# Patient Record
Sex: Female | Born: 1985 | Hispanic: Yes | Marital: Married | Smoking: Current every day smoker
Health system: Southern US, Community
[De-identification: ages and names within clinical notes are randomized; demographics above are authoritative.]

## PROBLEM LIST (undated history)

## (undated) ENCOUNTER — Inpatient Hospital Stay (HOSPITAL_COMMUNITY): Payer: Self-pay

## (undated) DIAGNOSIS — N939 Abnormal uterine and vaginal bleeding, unspecified: Secondary | ICD-10-CM

## (undated) DIAGNOSIS — B1920 Unspecified viral hepatitis C without hepatic coma: Secondary | ICD-10-CM

## (undated) DIAGNOSIS — T4145XA Adverse effect of unspecified anesthetic, initial encounter: Secondary | ICD-10-CM

## (undated) DIAGNOSIS — K8020 Calculus of gallbladder without cholecystitis without obstruction: Secondary | ICD-10-CM

## (undated) DIAGNOSIS — F419 Anxiety disorder, unspecified: Secondary | ICD-10-CM

## (undated) DIAGNOSIS — G43909 Migraine, unspecified, not intractable, without status migrainosus: Secondary | ICD-10-CM

## (undated) DIAGNOSIS — E669 Obesity, unspecified: Secondary | ICD-10-CM

## (undated) DIAGNOSIS — D649 Anemia, unspecified: Secondary | ICD-10-CM

## (undated) DIAGNOSIS — R110 Nausea: Secondary | ICD-10-CM

## (undated) DIAGNOSIS — K8010 Calculus of gallbladder with chronic cholecystitis without obstruction: Secondary | ICD-10-CM

## (undated) HISTORY — PX: CHOLECYSTECTOMY: SHX55

## (undated) HISTORY — DX: Abnormal uterine and vaginal bleeding, unspecified: N93.9

## (undated) HISTORY — PX: NO PAST SURGERIES: SHX2092

## (undated) HISTORY — DX: Nausea: R11.0

---
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
## 2004-03-20 ENCOUNTER — Encounter: Admission: RE | Admit: 2004-03-20 | Discharge: 2004-03-20 | Payer: Self-pay | Admitting: Family Medicine

## 2004-08-22 ENCOUNTER — Ambulatory Visit: Payer: Self-pay | Admitting: Family Medicine

## 2004-08-31 ENCOUNTER — Ambulatory Visit: Payer: Self-pay

## 2004-10-25 ENCOUNTER — Ambulatory Visit: Payer: Self-pay | Admitting: Family Medicine

## 2005-03-09 ENCOUNTER — Ambulatory Visit: Payer: Self-pay | Admitting: Family Medicine

## 2005-10-04 ENCOUNTER — Ambulatory Visit: Payer: Self-pay | Admitting: Sports Medicine

## 2005-10-04 ENCOUNTER — Other Ambulatory Visit: Admission: RE | Admit: 2005-10-04 | Discharge: 2005-10-04 | Payer: Self-pay | Admitting: Family Medicine

## 2005-10-04 ENCOUNTER — Encounter (INDEPENDENT_AMBULATORY_CARE_PROVIDER_SITE_OTHER): Payer: Self-pay | Admitting: *Deleted

## 2006-02-16 ENCOUNTER — Emergency Department (HOSPITAL_COMMUNITY): Admission: EM | Admit: 2006-02-16 | Discharge: 2006-02-16 | Payer: Self-pay | Admitting: Family Medicine

## 2006-02-17 ENCOUNTER — Encounter (INDEPENDENT_AMBULATORY_CARE_PROVIDER_SITE_OTHER): Payer: Self-pay | Admitting: *Deleted

## 2006-02-17 LAB — CONVERTED CEMR LAB

## 2006-02-21 ENCOUNTER — Emergency Department (HOSPITAL_COMMUNITY): Admission: EM | Admit: 2006-02-21 | Discharge: 2006-02-21 | Payer: Self-pay | Admitting: Family Medicine

## 2006-03-05 ENCOUNTER — Ambulatory Visit: Payer: Self-pay | Admitting: Sports Medicine

## 2006-03-05 ENCOUNTER — Other Ambulatory Visit: Admission: RE | Admit: 2006-03-05 | Discharge: 2006-03-05 | Payer: Self-pay | Admitting: Family Medicine

## 2006-03-05 ENCOUNTER — Encounter (INDEPENDENT_AMBULATORY_CARE_PROVIDER_SITE_OTHER): Payer: Self-pay | Admitting: Specialist

## 2006-05-17 ENCOUNTER — Emergency Department (HOSPITAL_COMMUNITY): Admission: EM | Admit: 2006-05-17 | Discharge: 2006-05-17 | Payer: Self-pay | Admitting: *Deleted

## 2006-09-04 ENCOUNTER — Emergency Department (HOSPITAL_COMMUNITY): Admission: EM | Admit: 2006-09-04 | Discharge: 2006-09-04 | Payer: Self-pay | Admitting: Family Medicine

## 2006-09-05 ENCOUNTER — Emergency Department (HOSPITAL_COMMUNITY): Admission: EM | Admit: 2006-09-05 | Discharge: 2006-09-05 | Payer: Self-pay | Admitting: Family Medicine

## 2006-11-27 ENCOUNTER — Ambulatory Visit: Payer: Self-pay | Admitting: Family Medicine

## 2006-11-27 ENCOUNTER — Encounter (INDEPENDENT_AMBULATORY_CARE_PROVIDER_SITE_OTHER): Payer: Self-pay | Admitting: Family Medicine

## 2007-01-17 ENCOUNTER — Encounter (INDEPENDENT_AMBULATORY_CARE_PROVIDER_SITE_OTHER): Payer: Self-pay | Admitting: *Deleted

## 2007-03-31 ENCOUNTER — Telehealth: Payer: Self-pay | Admitting: *Deleted

## 2007-04-01 ENCOUNTER — Ambulatory Visit: Payer: Self-pay | Admitting: Family Medicine

## 2007-04-02 ENCOUNTER — Encounter: Payer: Self-pay | Admitting: *Deleted

## 2007-06-05 ENCOUNTER — Ambulatory Visit: Payer: Self-pay | Admitting: Family Medicine

## 2007-06-05 ENCOUNTER — Telehealth: Payer: Self-pay | Admitting: *Deleted

## 2007-06-06 ENCOUNTER — Inpatient Hospital Stay (HOSPITAL_COMMUNITY): Admission: AD | Admit: 2007-06-06 | Discharge: 2007-06-06 | Payer: Self-pay | Admitting: Gynecology

## 2008-01-27 ENCOUNTER — Ambulatory Visit: Payer: Self-pay | Admitting: *Deleted

## 2008-01-27 ENCOUNTER — Inpatient Hospital Stay (HOSPITAL_COMMUNITY): Admission: AD | Admit: 2008-01-27 | Discharge: 2008-01-29 | Payer: Self-pay | Admitting: Obstetrics & Gynecology

## 2008-02-02 ENCOUNTER — Telehealth (INDEPENDENT_AMBULATORY_CARE_PROVIDER_SITE_OTHER): Payer: Self-pay | Admitting: Family Medicine

## 2008-02-03 ENCOUNTER — Ambulatory Visit: Payer: Self-pay | Admitting: Family Medicine

## 2008-02-21 ENCOUNTER — Telehealth (INDEPENDENT_AMBULATORY_CARE_PROVIDER_SITE_OTHER): Payer: Self-pay | Admitting: Family Medicine

## 2008-03-08 ENCOUNTER — Ambulatory Visit: Payer: Self-pay | Admitting: Family Medicine

## 2008-03-08 LAB — CONVERTED CEMR LAB: Beta hcg, urine, semiquantitative: NEGATIVE

## 2008-03-11 ENCOUNTER — Telehealth (INDEPENDENT_AMBULATORY_CARE_PROVIDER_SITE_OTHER): Payer: Self-pay | Admitting: Family Medicine

## 2008-04-13 ENCOUNTER — Ambulatory Visit: Payer: Self-pay | Admitting: Family Medicine

## 2008-04-13 DIAGNOSIS — F17200 Nicotine dependence, unspecified, uncomplicated: Secondary | ICD-10-CM | POA: Insufficient documentation

## 2008-04-13 LAB — CONVERTED CEMR LAB: Rapid Strep: NEGATIVE

## 2008-04-20 ENCOUNTER — Ambulatory Visit: Payer: Self-pay | Admitting: Family Medicine

## 2008-04-20 ENCOUNTER — Telehealth: Payer: Self-pay | Admitting: *Deleted

## 2008-04-20 DIAGNOSIS — B009 Herpesviral infection, unspecified: Secondary | ICD-10-CM

## 2008-07-21 ENCOUNTER — Ambulatory Visit: Payer: Self-pay | Admitting: Family Medicine

## 2008-10-13 ENCOUNTER — Telehealth: Payer: Self-pay | Admitting: *Deleted

## 2008-10-18 ENCOUNTER — Ambulatory Visit: Payer: Self-pay | Admitting: Family Medicine

## 2008-10-18 ENCOUNTER — Encounter: Payer: Self-pay | Admitting: Family Medicine

## 2008-10-18 LAB — CONVERTED CEMR LAB: Beta hcg, urine, semiquantitative: POSITIVE

## 2008-10-19 ENCOUNTER — Encounter: Payer: Self-pay | Admitting: Family Medicine

## 2008-10-22 ENCOUNTER — Encounter: Payer: Self-pay | Admitting: Family Medicine

## 2008-10-22 ENCOUNTER — Other Ambulatory Visit: Admission: RE | Admit: 2008-10-22 | Discharge: 2008-10-22 | Payer: Self-pay | Admitting: Family Medicine

## 2008-10-22 ENCOUNTER — Ambulatory Visit: Payer: Self-pay | Admitting: Family Medicine

## 2008-10-22 ENCOUNTER — Telehealth: Payer: Self-pay | Admitting: *Deleted

## 2008-10-22 LAB — CONVERTED CEMR LAB
Basophils Relative: 0 % (ref 0–1)
Eosinophils Relative: 1 % (ref 0–5)
HIV-1 antibody: NEGATIVE
HIV-2 Ab: NEGATIVE
HIV: REACTIVE
Hemoglobin: 11.5 g/dL — ABNORMAL LOW (ref 12.0–15.0)
Lymphocytes Relative: 21 % (ref 12–46)
Lymphs Abs: 1.6 10*3/uL (ref 0.7–4.0)
MCHC: 33.4 g/dL (ref 30.0–36.0)
MCV: 88 fL (ref 78.0–100.0)
Monocytes Absolute: 0.5 10*3/uL (ref 0.1–1.0)
Neutrophils Relative %: 73 % (ref 43–77)
Rh Type: POSITIVE
Sickle Cell Screen: NEGATIVE

## 2008-10-24 ENCOUNTER — Encounter: Payer: Self-pay | Admitting: Family Medicine

## 2008-10-27 ENCOUNTER — Encounter: Payer: Self-pay | Admitting: Family Medicine

## 2008-10-27 ENCOUNTER — Ambulatory Visit (HOSPITAL_COMMUNITY): Admission: RE | Admit: 2008-10-27 | Discharge: 2008-10-27 | Payer: Self-pay | Admitting: Family Medicine

## 2008-11-29 ENCOUNTER — Ambulatory Visit: Payer: Self-pay | Admitting: Family Medicine

## 2008-12-24 ENCOUNTER — Ambulatory Visit: Payer: Self-pay | Admitting: Physician Assistant

## 2008-12-24 ENCOUNTER — Inpatient Hospital Stay (HOSPITAL_COMMUNITY): Admission: AD | Admit: 2008-12-24 | Discharge: 2008-12-24 | Payer: Self-pay | Admitting: Obstetrics & Gynecology

## 2009-01-06 ENCOUNTER — Telehealth: Payer: Self-pay | Admitting: Family Medicine

## 2009-01-10 ENCOUNTER — Telehealth (INDEPENDENT_AMBULATORY_CARE_PROVIDER_SITE_OTHER): Payer: Self-pay | Admitting: Family Medicine

## 2009-01-18 ENCOUNTER — Ambulatory Visit: Payer: Self-pay | Admitting: Family Medicine

## 2009-02-01 ENCOUNTER — Ambulatory Visit: Payer: Self-pay | Admitting: Family Medicine

## 2009-02-01 ENCOUNTER — Encounter: Payer: Self-pay | Admitting: Family Medicine

## 2009-02-15 ENCOUNTER — Ambulatory Visit: Payer: Self-pay | Admitting: Family Medicine

## 2009-02-15 ENCOUNTER — Encounter: Payer: Self-pay | Admitting: Family Medicine

## 2009-02-24 ENCOUNTER — Telehealth: Payer: Self-pay | Admitting: Family Medicine

## 2009-02-24 ENCOUNTER — Ambulatory Visit: Payer: Self-pay | Admitting: Family Medicine

## 2009-03-01 ENCOUNTER — Telehealth: Payer: Self-pay | Admitting: *Deleted

## 2009-03-01 ENCOUNTER — Encounter: Payer: Self-pay | Admitting: Family Medicine

## 2009-03-01 ENCOUNTER — Ambulatory Visit: Payer: Self-pay | Admitting: Family Medicine

## 2009-03-10 ENCOUNTER — Encounter: Payer: Self-pay | Admitting: Family Medicine

## 2009-03-10 ENCOUNTER — Ambulatory Visit: Payer: Self-pay | Admitting: Family Medicine

## 2009-03-11 ENCOUNTER — Encounter: Payer: Self-pay | Admitting: Family Medicine

## 2009-03-16 ENCOUNTER — Ambulatory Visit: Payer: Self-pay | Admitting: Family Medicine

## 2009-03-16 ENCOUNTER — Encounter: Payer: Self-pay | Admitting: Family Medicine

## 2009-03-16 LAB — CONVERTED CEMR LAB

## 2009-03-25 ENCOUNTER — Ambulatory Visit: Payer: Self-pay | Admitting: Family Medicine

## 2009-03-25 ENCOUNTER — Encounter: Payer: Self-pay | Admitting: Family Medicine

## 2009-03-28 ENCOUNTER — Ambulatory Visit: Payer: Self-pay | Admitting: Family Medicine

## 2009-03-31 ENCOUNTER — Ambulatory Visit: Payer: Self-pay | Admitting: Obstetrics & Gynecology

## 2009-04-04 ENCOUNTER — Ambulatory Visit: Payer: Self-pay | Admitting: Family Medicine

## 2009-04-07 ENCOUNTER — Inpatient Hospital Stay (HOSPITAL_COMMUNITY): Admission: AD | Admit: 2009-04-07 | Discharge: 2009-04-07 | Payer: Self-pay | Admitting: Obstetrics & Gynecology

## 2009-04-08 ENCOUNTER — Ambulatory Visit: Payer: Self-pay | Admitting: Family Medicine

## 2009-04-08 ENCOUNTER — Inpatient Hospital Stay (HOSPITAL_COMMUNITY): Admission: RE | Admit: 2009-04-08 | Discharge: 2009-04-10 | Payer: Self-pay | Admitting: Obstetrics & Gynecology

## 2009-04-08 DIAGNOSIS — T8859XA Other complications of anesthesia, initial encounter: Secondary | ICD-10-CM

## 2009-04-08 HISTORY — DX: Other complications of anesthesia, initial encounter: T88.59XA

## 2009-04-12 ENCOUNTER — Ambulatory Visit: Payer: Self-pay | Admitting: Obstetrics and Gynecology

## 2009-04-12 ENCOUNTER — Emergency Department (HOSPITAL_COMMUNITY): Admission: EM | Admit: 2009-04-12 | Discharge: 2009-04-12 | Payer: Self-pay | Admitting: Emergency Medicine

## 2009-04-12 ENCOUNTER — Telehealth: Payer: Self-pay | Admitting: Family Medicine

## 2009-04-12 ENCOUNTER — Inpatient Hospital Stay (HOSPITAL_COMMUNITY): Admission: AD | Admit: 2009-04-12 | Discharge: 2009-04-12 | Payer: Self-pay | Admitting: Obstetrics & Gynecology

## 2009-05-24 ENCOUNTER — Ambulatory Visit: Payer: Self-pay | Admitting: Family Medicine

## 2009-06-03 ENCOUNTER — Encounter: Payer: Self-pay | Admitting: Family Medicine

## 2009-06-03 ENCOUNTER — Ambulatory Visit: Payer: Self-pay | Admitting: Family Medicine

## 2009-06-03 LAB — CONVERTED CEMR LAB: Beta hcg, urine, semiquantitative: NEGATIVE

## 2009-06-08 ENCOUNTER — Telehealth: Payer: Self-pay | Admitting: Family Medicine

## 2009-06-09 ENCOUNTER — Ambulatory Visit: Payer: Self-pay | Admitting: Family Medicine

## 2009-06-09 LAB — CONVERTED CEMR LAB: Beta hcg, urine, semiquantitative: NEGATIVE

## 2009-07-15 ENCOUNTER — Ambulatory Visit: Payer: Self-pay | Admitting: Family Medicine

## 2009-07-15 DIAGNOSIS — E669 Obesity, unspecified: Secondary | ICD-10-CM | POA: Insufficient documentation

## 2009-07-15 DIAGNOSIS — F411 Generalized anxiety disorder: Secondary | ICD-10-CM

## 2009-08-10 ENCOUNTER — Telehealth: Payer: Self-pay | Admitting: Family Medicine

## 2009-08-11 ENCOUNTER — Ambulatory Visit: Payer: Self-pay | Admitting: Family Medicine

## 2009-08-11 DIAGNOSIS — N926 Irregular menstruation, unspecified: Secondary | ICD-10-CM

## 2009-08-17 ENCOUNTER — Emergency Department (HOSPITAL_COMMUNITY): Admission: EM | Admit: 2009-08-17 | Discharge: 2009-08-17 | Payer: Self-pay | Admitting: Family Medicine

## 2010-01-17 ENCOUNTER — Encounter: Payer: Self-pay | Admitting: Family Medicine

## 2010-02-06 ENCOUNTER — Emergency Department (HOSPITAL_COMMUNITY): Admission: EM | Admit: 2010-02-06 | Discharge: 2010-02-06 | Payer: Self-pay | Admitting: Emergency Medicine

## 2010-05-02 ENCOUNTER — Emergency Department (HOSPITAL_COMMUNITY): Admission: EM | Admit: 2010-05-02 | Discharge: 2010-05-02 | Payer: Self-pay | Admitting: Emergency Medicine

## 2010-12-10 ENCOUNTER — Encounter: Payer: Self-pay | Admitting: Family Medicine

## 2010-12-11 ENCOUNTER — Emergency Department (HOSPITAL_COMMUNITY)
Admission: EM | Admit: 2010-12-11 | Discharge: 2010-12-11 | Payer: Self-pay | Source: Home / Self Care | Admitting: Emergency Medicine

## 2010-12-13 ENCOUNTER — Ambulatory Visit: Admission: RE | Admit: 2010-12-13 | Discharge: 2010-12-13 | Payer: Self-pay | Source: Home / Self Care

## 2010-12-13 ENCOUNTER — Encounter: Payer: Self-pay | Admitting: Family Medicine

## 2010-12-13 LAB — CONVERTED CEMR LAB: Whiff Test: NEGATIVE

## 2010-12-14 ENCOUNTER — Ambulatory Visit: Admit: 2010-12-14 | Payer: Self-pay

## 2010-12-19 NOTE — Miscellaneous (Signed)
Summary: Cleanup old acute medications   Clinical Lists Changes  Medications: Removed medication of HYDROXYZINE PAMOATE 25 MG CAPS (HYDROXYZINE PAMOATE) 1-2 tabs by mouth two times a day as needed for anxiety or insomnia Removed medication of FERROUS SULFATE 325 (65 FE) MG TBEC (FERROUS SULFATE) 1 tab by mouth two times a day

## 2010-12-21 NOTE — Assessment & Plan Note (Signed)
Summary: STD check/anxiety   Vital Signs:  Patient profile:   25 year old female Height:      62 inches Weight:      169 pounds BMI:     31.02 Temp:     98.8 degrees F oral BP sitting:   108 / 70  (left arm) Cuff size:   regular  Vitals Entered By: Tessie Fass CMA (December 13, 2010 2:09 PM) CC: upreg, STD check   Primary Care Provider:  Dessa Phi MD  CC:  upreg and STD check.  History of Present Illness: anxiety: has been taking [facility]Dr[facility]  Mayford Knife.  -- she recently has transferred her care to our clinic. Had to go to ER 12/12/10 for a "panic attack"- got upset with kids and it seemed to bring on panic attack.  ER doctor gave Klonopin 0.5mg  -- gave 15 tablets pt states she has been on zoloft (pt had hot flashes- took for a few months-did not seem to help the anxiety).  and ativan in past.   Been having problems with children's father- lot of life stressors.  Has a fear of pregnancy and has had multiple visits in the past surrounding her concern for pregnancy even though she has a IUD in place and has had many negative urine pregnancy tests.   STD check: Has been having unprotected sex with your fiance.  Some concern that he isn't faithful, so concerned that she may have a STD.  Yellow discharge--2 x during the past week.  no itching.  + foul odor.   Currently discharge has resolved.   pregnancy check: Has had IUD x 2 years.  when she has periods she bleeds heavy.   Always concerned that she is pregnant. Has had multiple pregnancy tests.  States that she can not be reassured that she is not pregnant.      Current Medications (verified): 1)  Acyclovir 400 Mg Tabs (Acyclovir) .Marland Kitchen.. 1 Tab By Mouth Three Times A Day Beginning At [redacted] Weeks Gestation.  Take Until Delivery.  Allergies (verified): No Known Drug Allergies  Review of Systems  The patient denies anorexia, fever, weight loss, weight gain, chest pain, syncope, dyspnea on  exertion, peripheral edema, and prolonged cough.    Physical Exam  General:  VSS Well-developed,well-nourished,in no acute distress; alert,appropriate and cooperative throughout examination Lungs:  Normal respiratory effort, chest expands symmetrically. Lungs are clear to auscultation, no crackles or wheezes. Heart:  Normal rate and regular rhythm. S1 and S2 normal without gallop, murmur, click, rub or other extra sounds. Abdomen:  soft, non-tender, and normal bowel sounds.   Genitalia:  Pelvic Exam:        External: normal female genitalia without lesions or masses        Vagina: normal without lesions or masses        Cervix: normal without lesions or masses        Adnexa: normal bimanual exam without masses or fullness        Uterus: normal by palpation        Pap smear: not performed Msk:  no edema Skin:  no rashes Psych:  Oriented X3, memory intact for recent and remote, good eye contact, and slightly anxious.     Impression & Recommendations:  Problem # 1:  SEXUALLY TRANSMITTED DISEASE, EXPOSURE TO (ICD-V01.6) Will obtain GC/Chlam, wet prep, RPR, HIV.  Pt is aware that if she had recent contact that she will need f/up HIV in 6-8  weeks.  Encouraged pt to use protection during sex.  Pt could not produce urine sample so unable to check for pregnancy as she requested.  She states she will come back or do a home pregnancy test.    Orders: RPR-[facility] [id number]) HIV-[facility] ([id number]) GC/Chlamydia-[facility] (87591/87491) Wet Prep- [facility] (30865) [facility]- Est  Level 4 (78469)  Problem # 2:  ANXIETY STATE, UNSPECIFIED (ICD-300.00) Pt states that she would like more klonopin b/c it works for her.  I explained that I only prescribe klonopin for short term while a pt is being titrated up on a SSRI.   I do not typically prescribe klonopin as a long-term method of anxiety treatment, Since SSRI and therapy are known to be a superior treatment. Pt states that she is not interested in SSRI's at this time  due to bad experience she had with zoloft. Therefore I did not prescribe any medications at today's visit.  She states she is open to meeting with Dr. Pascal Lux and will call to schedule an appt.   Orders: [facility]- Est  Level 4 (62952)  Complete Medication List: 1)  Acyclovir 400 Mg Tabs (Acyclovir) .Marland Kitchen.. 1 tab by mouth three times a day beginning at [redacted] weeks gestation.  take until delivery.  Patient Instructions: 1)  sign form up front to have records transferred from Dr. Mayford Knife [facility]. 2)  Use protection when you have sex.  3)  Call Dr. Pascal Lux to discuss anxiety issues.  4)  Please consider starting on a SSRI which is the class of drug that is recommended for treating anxiety.  5)  make an appt to f/up up with pcp regarding anxiety within the next 2-4 weeks .   Orders Added: 1)  RPR-[facility] [[id number]] 2)  HIV-[facility] [[id number]] 3)  GC/Chlamydia-[facility] [87591/87491] 4)  Wet Prep- [facility] [87210] 5)  [facility]- Est  Level 4 [27253]    Laboratory Results  Date/Time Received: December 13, 2010 3:13 PM  Date/Time Reported: December 13, 2010 6:08 PM   Wet [place] Source: vag WBC/hpf: occ Bacteria/hpf: 1+  Cocci Clue cells/hpf: none  Negative whiff Yeast/hpf: none Trichomonas/hpf: none Comments: many RBC's ...............test performed by......Marland KitchenBonnie A. [place], MLS (ASCP)cm

## 2011-01-01 ENCOUNTER — Encounter: Payer: Self-pay | Admitting: *Deleted

## 2011-02-05 LAB — URINALYSIS, ROUTINE W REFLEX MICROSCOPIC
Hgb urine dipstick: NEGATIVE
Ketones, ur: NEGATIVE mg/dL
Specific Gravity, Urine: 1.019 (ref 1.005–1.030)
Urobilinogen, UA: 0.2 mg/dL (ref 0.0–1.0)
pH: 6 (ref 5.0–8.0)

## 2011-02-05 LAB — DIFFERENTIAL
Basophils Absolute: 0 10*3/uL (ref 0.0–0.1)
Basophils Relative: 0 % (ref 0–1)
Eosinophils Absolute: 0.1 10*3/uL (ref 0.0–0.7)
Eosinophils Relative: 1 % (ref 0–5)
Lymphocytes Relative: 13 % (ref 12–46)

## 2011-02-05 LAB — COMPREHENSIVE METABOLIC PANEL
AST: 127 U/L — ABNORMAL HIGH (ref 0–37)
Alkaline Phosphatase: 107 U/L (ref 39–117)
BUN: 10 mg/dL (ref 6–23)
CO2: 25 mEq/L (ref 19–32)
Calcium: 9.5 mg/dL (ref 8.4–10.5)
Chloride: 107 mEq/L (ref 96–112)
GFR calc Af Amer: >60 mL/min (ref >60)
Glucose, Bld: 91 mg/dL (ref 70–99)
Potassium: 4.2 mEq/L (ref 3.5–5.1)
Sodium: 139 mEq/L (ref 135–145)
Total Bilirubin: 0.4 mg/dL (ref 0.3–1.2)
Total Protein: 7.4 g/dL (ref 6.0–8.3)

## 2011-02-05 LAB — URINE CULTURE: Colony Count: 25000

## 2011-02-05 LAB — CBC: RDW: 14.2 % (ref 11.5–15.5)

## 2011-02-10 ENCOUNTER — Emergency Department (HOSPITAL_COMMUNITY): Payer: Medicaid Other

## 2011-02-10 ENCOUNTER — Emergency Department (HOSPITAL_COMMUNITY)
Admission: EM | Admit: 2011-02-10 | Discharge: 2011-02-10 | Disposition: A | Discharge status: Home / Self Care | Payer: Medicaid Other | Attending: Emergency Medicine | Admitting: Emergency Medicine

## 2011-02-10 DIAGNOSIS — R51 Headache: Secondary | ICD-10-CM | POA: Insufficient documentation

## 2011-02-10 DIAGNOSIS — M542 Cervicalgia: Secondary | ICD-10-CM | POA: Insufficient documentation

## 2011-02-10 DIAGNOSIS — Y9239 Other specified sports and athletic area as the place of occurrence of the external cause: Secondary | ICD-10-CM | POA: Insufficient documentation

## 2011-02-10 DIAGNOSIS — F17200 Nicotine dependence, unspecified, uncomplicated: Secondary | ICD-10-CM | POA: Insufficient documentation

## 2011-02-10 DIAGNOSIS — T07XXXA Unspecified multiple injuries, initial encounter: Secondary | ICD-10-CM | POA: Insufficient documentation

## 2011-02-10 DIAGNOSIS — Y92838 Other recreation area as the place of occurrence of the external cause: Secondary | ICD-10-CM | POA: Insufficient documentation

## 2011-02-11 LAB — DIFFERENTIAL
Basophils Relative: 0 % (ref 0–1)
Eosinophils Absolute: 0 10*3/uL (ref 0.0–0.7)
Lymphs Abs: 1.7 10*3/uL (ref 0.7–4.0)
Neutro Abs: 14.9 10*3/uL — ABNORMAL HIGH (ref 1.7–7.7)
Neutrophils Relative %: 84 % — ABNORMAL HIGH (ref 43–77)

## 2011-02-11 LAB — URINALYSIS, ROUTINE W REFLEX MICROSCOPIC
Glucose, UA: NEGATIVE mg/dL
Protein, ur: 100 mg/dL — AB
Specific Gravity, Urine: 1.017 (ref 1.005–1.030)
pH: 6 (ref 5.0–8.0)

## 2011-02-11 LAB — BASIC METABOLIC PANEL
BUN: 4 mg/dL — ABNORMAL LOW (ref 6–23)
Calcium: 9.2 mg/dL (ref 8.4–10.5)
Chloride: 103 mEq/L (ref 96–112)
Creatinine, Ser: 0.54 mg/dL (ref 0.4–1.2)

## 2011-02-11 LAB — CBC
MCHC: 33.9 g/dL (ref 30.0–36.0)
MCV: 89.9 fL (ref 78.0–100.0)
Platelets: 294 10*3/uL (ref 150–400)
WBC: 17.8 10*3/uL — ABNORMAL HIGH (ref 4.0–10.5)

## 2011-02-11 LAB — URINE MICROSCOPIC-ADD ON

## 2011-02-11 LAB — POCT PREGNANCY, URINE: Preg Test, Ur: NEGATIVE

## 2011-02-15 ENCOUNTER — Emergency Department (HOSPITAL_COMMUNITY)
Admission: EM | Admit: 2011-02-15 | Discharge: 2011-02-15 | Disposition: A | Discharge status: Home / Self Care | Payer: Medicaid Other | Attending: Emergency Medicine | Admitting: Emergency Medicine

## 2011-02-15 DIAGNOSIS — F411 Generalized anxiety disorder: Secondary | ICD-10-CM | POA: Insufficient documentation

## 2011-02-15 DIAGNOSIS — Z79899 Other long term (current) drug therapy: Secondary | ICD-10-CM | POA: Insufficient documentation

## 2011-02-15 DIAGNOSIS — F3289 Other specified depressive episodes: Secondary | ICD-10-CM | POA: Insufficient documentation

## 2011-02-15 DIAGNOSIS — F17200 Nicotine dependence, unspecified, uncomplicated: Secondary | ICD-10-CM | POA: Insufficient documentation

## 2011-02-15 DIAGNOSIS — F329 Major depressive disorder, single episode, unspecified: Secondary | ICD-10-CM | POA: Insufficient documentation

## 2011-02-15 DIAGNOSIS — R4583 Excessive crying of child, adolescent or adult: Secondary | ICD-10-CM | POA: Insufficient documentation

## 2011-02-15 LAB — POCT I-STAT, CHEM 8
HCT: 42 % (ref 36.0–46.0)
Hemoglobin: 14.3 g/dL (ref 12.0–15.0)
Potassium: 3.9 mEq/L (ref 3.5–5.1)
Sodium: 139 mEq/L (ref 135–145)

## 2011-02-15 LAB — RAPID URINE DRUG SCREEN, HOSP PERFORMED
Cocaine: NOT DETECTED
Opiates: NOT DETECTED

## 2011-02-27 LAB — URINE CULTURE

## 2011-02-27 LAB — URINALYSIS, ROUTINE W REFLEX MICROSCOPIC
Nitrite: POSITIVE — AB
Specific Gravity, Urine: >1.03 — ABNORMAL HIGH (ref 1.005–1.030)
Urobilinogen, UA: 0.2 mg/dL (ref 0.0–1.0)

## 2011-02-27 LAB — DIFFERENTIAL
Eosinophils Absolute: 0.1 10*3/uL (ref 0.0–0.7)
Eosinophils Absolute: 0.1 10*3/uL (ref 0.0–0.7)
Eosinophils Relative: 1 % (ref 0–5)
Eosinophils Relative: 1 % (ref 0–5)
Lymphocytes Relative: 10 % — ABNORMAL LOW (ref 12–46)
Lymphs Abs: 1.1 10*3/uL (ref 0.7–4.0)
Lymphs Abs: 1.1 10*3/uL (ref 0.7–4.0)
Monocytes Absolute: 0.4 10*3/uL (ref 0.1–1.0)

## 2011-02-27 LAB — BASIC METABOLIC PANEL
BUN: 6 mg/dL (ref 6–23)
CO2: 26 mEq/L (ref 19–32)
Chloride: 105 mEq/L (ref 96–112)
Glucose, Bld: 95 mg/dL (ref 70–99)
Potassium: 3.8 mEq/L (ref 3.5–5.1)

## 2011-02-27 LAB — CBC
HCT: 33 % — ABNORMAL LOW (ref 36.0–46.0)
HCT: 29.8 % — ABNORMAL LOW (ref 36.0–46.0)
Hemoglobin: 10.5 g/dL — ABNORMAL LOW (ref 12.0–15.0)
Hemoglobin: 9.8 g/dL — ABNORMAL LOW (ref 12.0–15.0)
MCHC: 33.8 g/dL (ref 30.0–36.0)
MCV: 92.1 fL (ref 78.0–100.0)
MCV: 91.8 fL (ref 78.0–100.0)
MCV: 91.9 fL (ref 78.0–100.0)
Platelets: 292 10*3/uL (ref 150–400)
Platelets: 177 10*3/uL (ref 150–400)
Platelets: 233 10*3/uL (ref 150–400)
Platelets: 237 10*3/uL (ref 150–400)
RDW: 13.3 % (ref 11.5–15.5)
RDW: 13.4 % (ref 11.5–15.5)
RDW: 13.6 % (ref 11.5–15.5)
WBC: 11.3 10*3/uL — ABNORMAL HIGH (ref 4.0–10.5)
WBC: 10.8 10*3/uL — ABNORMAL HIGH (ref 4.0–10.5)
WBC: 7.2 10*3/uL (ref 4.0–10.5)

## 2011-02-27 LAB — RPR: RPR Ser Ql: NONREACTIVE

## 2011-02-27 LAB — URINE MICROSCOPIC-ADD ON

## 2011-03-01 LAB — GLUCOSE, CAPILLARY: Glucose-Capillary: 121 mg/dL — ABNORMAL HIGH (ref 70–99)

## 2011-03-06 LAB — URINALYSIS, ROUTINE W REFLEX MICROSCOPIC
Glucose, UA: NEGATIVE mg/dL
Ketones, ur: NEGATIVE mg/dL
Nitrite: NEGATIVE
Protein, ur: NEGATIVE mg/dL
Urobilinogen, UA: 0.2 mg/dL (ref 0.0–1.0)

## 2011-03-06 LAB — COMPREHENSIVE METABOLIC PANEL
ALT: 14 U/L (ref 0–35)
Albumin: 3.1 g/dL — ABNORMAL LOW (ref 3.5–5.2)
Alkaline Phosphatase: 96 U/L (ref 39–117)
Calcium: 9 mg/dL (ref 8.4–10.5)
GFR calc Af Amer: >60 mL/min (ref >60)
Glucose, Bld: 76 mg/dL (ref 70–99)
Potassium: 3.9 mEq/L (ref 3.5–5.1)
Sodium: 135 mEq/L (ref 135–145)
Total Protein: 6.5 g/dL (ref 6.0–8.3)

## 2011-03-06 LAB — AMYLASE: Amylase: 100 U/L (ref 27–131)

## 2011-03-06 LAB — LIPASE, BLOOD: Lipase: 17 U/L (ref 11–59)

## 2011-03-06 LAB — CBC
Hemoglobin: 11.8 g/dL — ABNORMAL LOW (ref 12.0–15.0)
MCHC: 33.7 g/dL (ref 30.0–36.0)
Platelets: 268 10*3/uL (ref 150–400)
RDW: 14 % (ref 11.5–15.5)

## 2011-04-27 ENCOUNTER — Ambulatory Visit
Admission: RE | Admit: 2011-04-27 | Discharge: 2011-04-27 | Disposition: A | Discharge status: Home / Self Care | Payer: Medicaid Other | Source: Ambulatory Visit | Attending: Specialist | Admitting: Specialist

## 2011-04-27 ENCOUNTER — Other Ambulatory Visit: Payer: Self-pay | Admitting: Specialist

## 2011-04-27 DIAGNOSIS — M545 Low back pain, unspecified: Secondary | ICD-10-CM

## 2011-08-13 LAB — RAPID URINE DRUG SCREEN, HOSP PERFORMED
Amphetamines: NOT DETECTED
Cocaine: NOT DETECTED
Opiates: NOT DETECTED
Tetrahydrocannabinol: NOT DETECTED

## 2011-08-13 LAB — CBC
HCT: 31.6 — ABNORMAL LOW
Hemoglobin: 11 — ABNORMAL LOW
MCV: 92
RDW: 13.6

## 2011-09-03 LAB — URINALYSIS, ROUTINE W REFLEX MICROSCOPIC
Bilirubin Urine: NEGATIVE
Ketones, ur: NEGATIVE
Leukocytes, UA: NEGATIVE
Nitrite: NEGATIVE
Specific Gravity, Urine: 1.01
Urobilinogen, UA: 0.2

## 2011-09-03 LAB — GC/CHLAMYDIA PROBE AMP, GENITAL
Chlamydia, DNA Probe: NEGATIVE
GC Probe Amp, Genital: NEGATIVE

## 2011-09-03 LAB — CBC
HCT: 36.9
MCV: 91.9
Platelets: 215
RDW: 13.3

## 2011-09-03 LAB — WET PREP, GENITAL

## 2011-12-13 ENCOUNTER — Emergency Department (HOSPITAL_COMMUNITY)
Admission: EM | Admit: 2011-12-13 | Discharge: 2011-12-13 | Disposition: A | Discharge status: Home / Self Care | Payer: Medicaid Other | Attending: Emergency Medicine | Admitting: Emergency Medicine

## 2011-12-13 ENCOUNTER — Encounter (HOSPITAL_COMMUNITY): Payer: Self-pay

## 2011-12-13 ENCOUNTER — Emergency Department (HOSPITAL_COMMUNITY): Payer: Medicaid Other

## 2011-12-13 DIAGNOSIS — M549 Dorsalgia, unspecified: Secondary | ICD-10-CM | POA: Insufficient documentation

## 2011-12-13 DIAGNOSIS — R1012 Left upper quadrant pain: Secondary | ICD-10-CM | POA: Insufficient documentation

## 2011-12-13 DIAGNOSIS — K8020 Calculus of gallbladder without cholecystitis without obstruction: Secondary | ICD-10-CM | POA: Insufficient documentation

## 2011-12-13 DIAGNOSIS — R110 Nausea: Secondary | ICD-10-CM | POA: Insufficient documentation

## 2011-12-13 HISTORY — DX: Calculus of gallbladder without cholecystitis without obstruction: K80.20

## 2011-12-13 LAB — COMPREHENSIVE METABOLIC PANEL
AST: 20 U/L (ref 0–37)
Albumin: 3.8 g/dL (ref 3.5–5.2)
Alkaline Phosphatase: 106 U/L (ref 39–117)
BUN: 10 mg/dL (ref 6–23)
Chloride: 105 mEq/L (ref 96–112)
Creatinine, Ser: 0.64 mg/dL (ref 0.50–1.10)
Potassium: 4.3 mEq/L (ref 3.5–5.1)
Sodium: 153 mEq/L — ABNORMAL HIGH (ref 135–145)
Total Bilirubin: 0.2 mg/dL — ABNORMAL LOW (ref 0.3–1.2)
Total Protein: 7.7 g/dL (ref 6.0–8.3)

## 2011-12-13 LAB — URINE MICROSCOPIC-ADD ON

## 2011-12-13 LAB — DIFFERENTIAL
Basophils Absolute: 0 10*3/uL (ref 0.0–0.1)
Basophils Relative: 1 % (ref 0–1)
Lymphocytes Relative: 26 % (ref 12–46)
Monocytes Absolute: 0.7 10*3/uL (ref 0.1–1.0)
Neutro Abs: 5.1 10*3/uL (ref 1.7–7.7)
Neutrophils Relative %: 62 % (ref 43–77)

## 2011-12-13 LAB — URINALYSIS, ROUTINE W REFLEX MICROSCOPIC
Glucose, UA: NEGATIVE mg/dL
Ketones, ur: 15 mg/dL — AB
Protein, ur: NEGATIVE mg/dL
pH: 5.5 (ref 5.0–8.0)

## 2011-12-13 LAB — CBC
HCT: 39 % (ref 36.0–46.0)
Platelets: 299 10*3/uL (ref 150–400)
RDW: 13.2 % (ref 11.5–15.5)
WBC: 8.2 10*3/uL (ref 4.0–10.5)

## 2011-12-13 MED ORDER — ONDANSETRON HCL 4 MG/2ML IJ SOLN
4.0000 mg | Freq: Once | INTRAMUSCULAR | Status: AC
  Administered 2011-12-13: 4 mg via INTRAVENOUS
  Filled 2011-12-13: qty 2

## 2011-12-13 MED ORDER — MORPHINE SULFATE 4 MG/ML IJ SOLN
4.0000 mg | Freq: Once | INTRAMUSCULAR | Status: AC
  Administered 2011-12-13: 4 mg via INTRAVENOUS
  Filled 2011-12-13: qty 1

## 2011-12-13 MED ORDER — PANTOPRAZOLE SODIUM 20 MG PO TBEC: 20.0000 mg | DELAYED_RELEASE_TABLET | Freq: Every day | ORAL | Status: DC

## 2011-12-13 MED ORDER — HYDROCODONE-ACETAMINOPHEN 5-325 MG PO TABS: 1.0000 | ORAL_TABLET | Freq: Four times a day (QID) | ORAL | Status: DC | PRN

## 2011-12-13 NOTE — ED Notes (Signed)
Discharge instructions reviewed; verbalizes understanding.  No questions asked; no further c/o's voiced.  Ambulatory to lobby.  NAD noted. 

## 2011-12-13 NOTE — ED Provider Notes (Signed)
History     CSN: [id number]  Arrival date & time 12/13/11  0840   First MD Initiated Contact with Patient 12/13/11 0856     9:24 AM  HPI Patient reports a history of gallstones diagnosed years ago. For the last month has had in her upper abdominal pain. States this morning acutely developed left upper quadrant pain. Reports pain was severe it woke her from sleep. States pain usually occurs at night and has no relation to eating or drinking. Reports pain radiates to her back. Associated with nausea. Denies vomiting, diarrhea, fever, urinary symptoms or vaginal symptoms. Does report she is currently on her menstrual cycle Patient is a 26 y.o. female presenting with abdominal pain. The history is provided by the patient.  Abdominal Pain The primary symptoms of the illness include abdominal pain and nausea. The primary symptoms of the illness do not include fever, shortness of breath, vomiting, diarrhea, dysuria or vaginal discharge.  Symptoms associated with the illness do not include chills, constipation, urgency, hematuria, frequency or back pain.    Past Medical History  Diagnosis Date  . Gallstones     History reviewed. No pertinent past surgical history.  No family history on file.  History  Substance Use Topics  . Smoking status: Current Everyday Smoker -- 0.5 packs/day  . Smokeless tobacco: Not on file  . Alcohol Use: Yes    OB History    Grav Para Term Preterm Abortions TAB SAB Ect Mult Living                  Review of Systems  Constitutional: Negative for fever and chills.  Respiratory: Negative for shortness of breath.   Cardiovascular: Negative for chest pain.  Gastrointestinal: Positive for nausea and abdominal pain. Negative for vomiting, diarrhea and constipation.  Genitourinary: Negative for dysuria, urgency, frequency, hematuria, flank pain, vaginal discharge and vaginal pain.  Musculoskeletal: Negative for back pain.  All other systems reviewed and are  negative.    Allergies  Review of patient's allergies indicates no known allergies.  Home Medications   Current Outpatient Rx  Name Route Sig Dispense Refill  . CLONAZEPAM 0.5 MG PO TABS Oral Take 0.5 mg by mouth 2 (two) times daily as needed. anxiety      BP 123/74  Pulse 69  Temp(Src) 97.4 F (36.3 C) (Oral)  Resp 20  SpO2 97%  LMP 12/13/2011  Physical Exam  Vitals reviewed. Constitutional: She is oriented to person, place, and time. Vital signs are normal. She appears well-developed and well-nourished.  HENT:  Head: Normocephalic and atraumatic.  Eyes: Conjunctivae are normal. Pupils are equal, round, and reactive to light.  Neck: Normal range of motion. Neck supple.  Cardiovascular: Normal rate, regular rhythm and normal heart sounds.  Exam reveals no friction rub.   No murmur heard. Pulmonary/Chest: Effort normal and breath sounds normal. She has no wheezes. She has no rhonchi. She has no rales. She exhibits no tenderness.  Abdominal: Soft. Bowel sounds are normal. She exhibits no distension and no mass. There is no tenderness. There is no rebound and no guarding.  Musculoskeletal: Normal range of motion.  Neurological: She is alert and oriented to person, place, and time. Coordination normal.  Skin: Skin is warm and dry. No rash noted. No erythema. No pallor.    ED Course  Procedures   Results for orders placed during the hospital encounter of 12/13/11  CBC      Component Value Range   WBC 8.2  4.0 - 10.5 (K/uL)   RBC 4.14  3.87 - 5.11 (MIL/uL)   Hemoglobin 13.2  12.0 - 15.0 (g/dL)   HCT 45.4  09.8 - 11.9 (%)   MCV 94.2  78.0 - 100.0 (fL)   MCH 31.9  26.0 - 34.0 (pg)   MCHC 33.8  30.0 - 36.0 (g/dL)   RDW 14.7  82.9 - 56.2 (%)   Platelets 299  150 - 400 (K/uL)  DIFFERENTIAL      Component Value Range   Neutrophils Relative 62  43 - 77 (%)   Neutro Abs 5.1  1.7 - 7.7 (K/uL)   Lymphocytes Relative 26  12 - 46 (%)   Lymphs Abs 2.2  0.7 - 4.0 (K/uL)    Monocytes Relative 8  3 - 12 (%)   Monocytes Absolute 0.7  0.1 - 1.0 (K/uL)   Eosinophils Relative 3  0 - 5 (%)   Eosinophils Absolute 0.2  0.0 - 0.7 (K/uL)   Basophils Relative 1  0 - 1 (%)   Basophils Absolute 0.0  0.0 - 0.1 (K/uL)  COMPREHENSIVE METABOLIC PANEL      Component Value Range   Sodium 153 (*) 135 - 145 (mEq/L)   Potassium PENDING  3.5 - 5.1 (mEq/L)   Chloride PENDING  96 - 112 (mEq/L)   CO2 PENDING  19 - 32 (mEq/L)   Glucose, Bld PENDING  70 - 99 (mg/dL)   BUN PENDING  6 - 23 (mg/dL)   Creatinine, Ser PENDING  0.50 - 1.10 (mg/dL)   Calcium PENDING  8.4 - 10.5 (mg/dL)   Total Protein PENDING  6.0 - 8.3 (g/dL)   Albumin PENDING  3.5 - 5.2 (g/dL)   AST PENDING  0 - 37 (U/L)   ALT PENDING  0 - 35 (U/L)   Alkaline Phosphatase PENDING  39 - 117 (U/L)   Total Bilirubin PENDING  0.3 - 1.2 (mg/dL)   GFR calc non Af Amer PENDING  >90 (mL/min)   GFR calc Af Amer PENDING  >90 (mL/min)  LIPASE, BLOOD      Component Value Range   Lipase 29  11 - 59 (U/L)  URINALYSIS, ROUTINE W REFLEX MICROSCOPIC      Component Value Range   Color, Urine YELLOW  YELLOW    APPearance CLOUDY (*) CLEAR    Specific Gravity, Urine 1.020  1.005 - 1.030    pH 5.5  5.0 - 8.0    Glucose, UA NEGATIVE  NEGATIVE (mg/dL)   Hgb urine dipstick LARGE (*) NEGATIVE    Bilirubin Urine NEGATIVE  NEGATIVE    Ketones, ur 15 (*) NEGATIVE (mg/dL)   Protein, ur NEGATIVE  NEGATIVE (mg/dL)   Urobilinogen, UA 0.2  0.0 - 1.0 (mg/dL)   Nitrite NEGATIVE  NEGATIVE    Leukocytes, UA SMALL (*) NEGATIVE   PREGNANCY, URINE      Component Value Range   Preg Test, Ur NEGATIVE    URINE MICROSCOPIC-ADD ON      Component Value Range   Squamous Epithelial / LPF MANY (*) RARE    WBC, UA 3-6  <3 (WBC/hpf)   RBC / HPF 3-6  <3 (RBC/hpf)   Bacteria, UA FEW (*) RARE   COMPREHENSIVE METABOLIC PANEL      Component Value Range   Sodium 140  135 - 145 (mEq/L)   Potassium 4.3  3.5 - 5.1 (mEq/L)   Chloride 105  96 - 112 (mEq/L)    CO2 20  19 - 32 (  mEq/L)   Glucose, Bld 92  70 - 99 (mg/dL)   BUN 10  6 - 23 (mg/dL)   Creatinine, Ser 4.54  0.50 - 1.10 (mg/dL)   Calcium 8.9  8.4 - 09.8 (mg/dL)   Total Protein 7.7  6.0 - 8.3 (g/dL)   Albumin 3.8  3.5 - 5.2 (g/dL)   AST 20  0 - 37 (U/L)   ALT 17  0 - 35 (U/L)   Alkaline Phosphatase 106  39 - 117 (U/L)   Total Bilirubin 0.2 (*) 0.3 - 1.2 (mg/dL)   GFR calc non Af Amer >90  >90 (mL/min)   GFR calc Af Amer >90  >90 (mL/min)   US Abdomen Complete  12/13/2011  *RADIOLOGY REPORT*  Clinical Data:  Pain.  Evaluate for cholecystitis.  COMPLETE ABDOMINAL ULTRASOUND  Comparison:  05/02/2010.  Findings:  Gallbladder:  Multiple shadowing echogenic stones are seen within, measuring up to 1 cm.  There may be a 5 mm stone in the gallbladder neck.  Gallbladder wall measures 3 mm.  No sonographic Murphy's sign.  Common bile duct:  Measures 7 mm, prominent for age.  Liver:  No focal lesion identified.  Within normal limits in parenchymal echogenicity.  IVC:  Appears normal.  Pancreas:  No focal abnormality seen.  Spleen:  Measures 9.9 cm, negative.  Right Kidney:  Measures 10.1 cm with normal parenchymal echogenicity.  No hydronephrosis.  Left Kidney:  Measures 10.8 cm with normal parenchymal echogenicity.  No hydronephrosis.  Abdominal aorta:  No aneurysm identified.  IMPRESSION:  1.  Cholelithiasis without acute cholecystitis. 2.  Mild common bile duct dilatation.  Original Report Authenticated By: Reyes Ivan, M.D.     MDM   Labs within normal limits. Pain relief after pain medication. Advised patient should stay change in her ultrasound report. Advised followup with surgeons to discuss potential cholecystectomy. Discussed differential diagnosis includes gastritis especially with pain being on the left side. Will prescribe patient Protonix as well as analgesic.     Thomasene Lot, PA-C 12/13/11 1345

## 2011-12-13 NOTE — ED Provider Notes (Signed)
Medical screening examination/treatment/procedure(s) were performed by non-physician practitioner and as supervising physician I was immediately available for consultation/collaboration.  Juanya Villavicencio R. Oaklynn Stierwalt, MD 12/13/11 1531 

## 2011-12-13 NOTE — ED Notes (Signed)
To [place] pt states she has anxiety and is out of her klonipin for a few months

## 2011-12-13 NOTE — ED Notes (Signed)
Pt presents with LUQ abdominal pain x 1 month that has been intermittent and worsened today.  Pt reports h/o gallstones, has not changed diet.  +nausea, denies any vomiting or diarrhea;  Last bowel movement was yesterday.

## 2011-12-13 NOTE — ED Notes (Signed)
Pain has been going on for a year states was seen at [facility] and dx w/ gall stones but never f/u. Pt states she just wants it out now  Or soon If she can

## 2011-12-22 ENCOUNTER — Emergency Department (HOSPITAL_COMMUNITY)
Admission: EM | Admit: 2011-12-22 | Discharge: 2011-12-22 | Disposition: A | Discharge status: Home / Self Care | Payer: Medicaid Other | Attending: Emergency Medicine | Admitting: Emergency Medicine

## 2011-12-22 ENCOUNTER — Encounter (HOSPITAL_COMMUNITY): Payer: Self-pay | Admitting: *Deleted

## 2011-12-22 DIAGNOSIS — Z79899 Other long term (current) drug therapy: Secondary | ICD-10-CM | POA: Insufficient documentation

## 2011-12-22 DIAGNOSIS — K8050 Calculus of bile duct without cholangitis or cholecystitis without obstruction: Secondary | ICD-10-CM

## 2011-12-22 DIAGNOSIS — K8020 Calculus of gallbladder without cholecystitis without obstruction: Secondary | ICD-10-CM | POA: Insufficient documentation

## 2011-12-22 DIAGNOSIS — M549 Dorsalgia, unspecified: Secondary | ICD-10-CM | POA: Insufficient documentation

## 2011-12-22 DIAGNOSIS — F17200 Nicotine dependence, unspecified, uncomplicated: Secondary | ICD-10-CM | POA: Insufficient documentation

## 2011-12-22 DIAGNOSIS — R1011 Right upper quadrant pain: Secondary | ICD-10-CM

## 2011-12-22 DIAGNOSIS — R10819 Abdominal tenderness, unspecified site: Secondary | ICD-10-CM | POA: Insufficient documentation

## 2011-12-22 DIAGNOSIS — R112 Nausea with vomiting, unspecified: Secondary | ICD-10-CM | POA: Insufficient documentation

## 2011-12-22 LAB — CBC
HCT: 38.9 % (ref 36.0–46.0)
MCH: 31.4 pg (ref 26.0–34.0)
MCV: 95.3 fL (ref 78.0–100.0)
RBC: 4.08 MIL/uL (ref 3.87–5.11)
WBC: 6.3 10*3/uL (ref 4.0–10.5)

## 2011-12-22 LAB — URINALYSIS, MICROSCOPIC ONLY
Bilirubin Urine: NEGATIVE
Glucose, UA: NEGATIVE mg/dL
Ketones, ur: NEGATIVE mg/dL
Specific Gravity, Urine: 1.018 (ref 1.005–1.030)
pH: 6 (ref 5.0–8.0)

## 2011-12-22 LAB — DIFFERENTIAL
Basophils Absolute: 0 10*3/uL (ref 0.0–0.1)
Basophils Relative: 1 % (ref 0–1)
Eosinophils Absolute: 0.1 10*3/uL (ref 0.0–0.7)
Eosinophils Relative: 2 % (ref 0–5)
Lymphocytes Relative: 27 % (ref 12–46)
Lymphs Abs: 1.7 10*3/uL (ref 0.7–4.0)
Monocytes Absolute: 0.6 10*3/uL (ref 0.1–1.0)
Monocytes Relative: 9 % (ref 3–12)

## 2011-12-22 LAB — COMPREHENSIVE METABOLIC PANEL
AST: 15 U/L (ref 0–37)
Albumin: 3.6 g/dL (ref 3.5–5.2)
Alkaline Phosphatase: 92 U/L (ref 39–117)
Chloride: 104 mEq/L (ref 96–112)
Potassium: 4.4 mEq/L (ref 3.5–5.1)
Sodium: 138 mEq/L (ref 135–145)
Total Bilirubin: 0.2 mg/dL — ABNORMAL LOW (ref 0.3–1.2)

## 2011-12-22 MED ORDER — MORPHINE SULFATE 4 MG/ML IJ SOLN
4.0000 mg | Freq: Once | INTRAMUSCULAR | Status: AC
  Administered 2011-12-22: 4 mg via INTRAVENOUS
  Filled 2011-12-22: qty 1

## 2011-12-22 MED ORDER — ONDANSETRON HCL 4 MG/2ML IJ SOLN
4.0000 mg | Freq: Once | INTRAMUSCULAR | Status: AC
  Administered 2011-12-22: 4 mg via INTRAVENOUS
  Filled 2011-12-22: qty 2

## 2011-12-22 MED ORDER — GI COCKTAIL ~~LOC~~
30.0000 mL | Freq: Once | ORAL | Status: AC
  Administered 2011-12-22: 30 mL via ORAL
  Filled 2011-12-22: qty 30

## 2011-12-22 MED ORDER — HYDROCODONE-ACETAMINOPHEN 5-325 MG PO TABS: 1.0000 | ORAL_TABLET | Freq: Four times a day (QID) | ORAL | Status: AC | PRN

## 2011-12-22 MED ORDER — SODIUM CHLORIDE 0.9 % IV SOLN
Freq: Once | INTRAVENOUS | Status: AC
  Administered 2011-12-22: 11:00:00 via INTRAVENOUS

## 2011-12-22 MED ORDER — HYDROMORPHONE HCL PF 1 MG/ML IJ SOLN
1.0000 mg | Freq: Once | INTRAMUSCULAR | Status: AC
  Administered 2011-12-22: 1 mg via INTRAVENOUS
  Filled 2011-12-22: qty 1

## 2011-12-22 MED ORDER — ONDANSETRON 8 MG PO TBDP: 8.0000 mg | ORAL_TABLET | Freq: Three times a day (TID) | ORAL | Status: AC | PRN

## 2011-12-22 MED ORDER — SODIUM CHLORIDE 0.9 % IV BOLUS (SEPSIS)
1000.0000 mL | Freq: Once | INTRAVENOUS | Status: AC
  Administered 2011-12-22: 1000 mL via INTRAVENOUS

## 2011-12-22 NOTE — ED Notes (Signed)
Patient denies pain and is resting comfortably. No voiced complaints presently. NAD. Awaiting test results.

## 2011-12-22 NOTE — ED Notes (Signed)
Consulting MD, general surgery at bedside

## 2011-12-22 NOTE — ED Notes (Addendum)
Reports has had intermittent upper abd pain x 1 yr. Dx with gallstone 1 week ago. Has not yet seen surgeon. States awoke this am with pain & nausea, no emesis. "I just want it taken out".

## 2011-12-22 NOTE — ED Provider Notes (Signed)
Pt seen and examined.  Patient has been diagnosed with cholelithiasis and referred to general surgery. Patient has not been following a strict diet. She last night had a steak and cheese some. This morning she is planning of pain in her upper abdomen and back again. No fevers or vomiting. Suspect this could be a recurrent episode of biliary colic although it is possible she could have an early cholecystitis. Patient will be treated with pain medications. We'll check her labs and reassess.  Medical screening examination/treatment/procedure(s) were conducted as a shared visit with non-physician practitioner(s) and myself.  I personally evaluated the patient during the encounter   Celene Kras, MD 12/22/11 [phone]

## 2011-12-22 NOTE — Consult Note (Signed)
Reason for Consult:Gallstones Referring Physician: Marlo Valdez is an 26 y.o. female.  HPI: Patient has a long history of intermittent right upper quadrant pain usually coming on at night. She was recently diagnosed with gallstones on ultrasound. She has not been following a low fat diet. She ate a cheese steak sandwich last night and developed significant right upper quadrant pain this morning. The pain is improved after pain medication in the emergency department. She is anxious about the thought of needing to have surgery at some point.  Past Medical History  Diagnosis Date  . Gallstones     History reviewed. No pertinent past surgical history.  No family history on file.  Social History:  reports that she has been smoking Cigarettes.  She has a 5 pack-year smoking history. She does not have any smokeless tobacco history on file. She reports that she drinks alcohol. She reports that she uses illicit drugs (Marijuana).  Allergies: No Known Allergies  Medications: I have reviewed the patient's current medications.  Results for orders placed during the hospital encounter of 12/22/11 (from the past 48 hour(s))  CBC     Status: Normal   Collection Time   12/22/11  9:44 AM      Component Value Range Comment   WBC 6.3  4.0 - 10.5 (K/uL)    RBC 4.08  3.87 - 5.11 (MIL/uL)    Hemoglobin 12.8  12.0 - 15.0 (g/dL)    HCT 16.1  09.6 - 04.5 (%)    MCV 95.3  78.0 - 100.0 (fL)    MCH 31.4  26.0 - 34.0 (pg)    MCHC 32.9  30.0 - 36.0 (g/dL)    RDW 40.9  81.1 - 91.4 (%)    Platelets 293  150 - 400 (K/uL)   DIFFERENTIAL     Status: Normal   Collection Time   12/22/11  9:44 AM      Component Value Range Comment   Neutrophils Relative 62  43 - 77 (%)    Neutro Abs 3.9  1.7 - 7.7 (K/uL)    Lymphocytes Relative 27  12 - 46 (%)    Lymphs Abs 1.7  0.7 - 4.0 (K/uL)    Monocytes Relative 9  3 - 12 (%)    Monocytes Absolute 0.6  0.1 - 1.0 (K/uL)    Eosinophils Relative 2  0 - 5 (%)    Eosinophils Absolute 0.1  0.0 - 0.7 (K/uL)    Basophils Relative 1  0 - 1 (%)    Basophils Absolute 0.0  0.0 - 0.1 (K/uL)   COMPREHENSIVE METABOLIC PANEL     Status: Abnormal   Collection Time   12/22/11  9:44 AM      Component Value Range Comment   Sodium 138  135 - 145 (mEq/L)    Potassium 4.4  3.5 - 5.1 (mEq/L)    Chloride 104  96 - 112 (mEq/L)    CO2 25  19 - 32 (mEq/L)    Glucose, Bld 86  70 - 99 (mg/dL)    BUN 12  6 - 23 (mg/dL)    Creatinine, Ser 7.82  0.50 - 1.10 (mg/dL)    Calcium 9.0  8.4 - 10.5 (mg/dL)    Total Protein 7.6  6.0 - 8.3 (g/dL)    Albumin 3.6  3.5 - 5.2 (g/dL)    AST 15  0 - 37 (U/L)    ALT 18  0 - 35 (U/L)  Alkaline Phosphatase 92  39 - 117 (U/L)    Total Bilirubin 0.2 (*) 0.3 - 1.2 (mg/dL)    GFR calc non Af Amer >90  >90 (mL/min)    GFR calc Af Amer >90  >90 (mL/min)   LIPASE, BLOOD     Status: Normal   Collection Time   12/22/11  9:44 AM      Component Value Range Comment   Lipase 36  11 - 59 (U/L)   URINALYSIS, WITH MICROSCOPIC     Status: Abnormal   Collection Time   12/22/11  9:58 AM      Component Value Range Comment   Color, Urine YELLOW  YELLOW     APPearance CLOUDY (*) CLEAR     Specific Gravity, Urine 1.018  1.005 - 1.030     pH 6.0  5.0 - 8.0     Glucose, UA NEGATIVE  NEGATIVE (mg/dL)    Hgb urine dipstick TRACE (*) NEGATIVE     Bilirubin Urine NEGATIVE  NEGATIVE     Ketones, ur NEGATIVE  NEGATIVE (mg/dL)    Protein, ur NEGATIVE  NEGATIVE (mg/dL)    Urobilinogen, UA 0.2  0.0 - 1.0 (mg/dL)    Nitrite NEGATIVE  NEGATIVE     Leukocytes, UA MODERATE (*) NEGATIVE     WBC, UA 3-6  <3 (WBC/hpf)    Bacteria, UA FEW (*) RARE     Squamous Epithelial / LPF MANY (*) RARE     Urine-Other AMORPHOUS URATES/PHOSPHATES     POCT PREGNANCY, URINE     Status: Normal   Collection Time   12/22/11  9:58 AM      Component Value Range Comment   Preg Test, Ur NEGATIVE  NEGATIVE      No results found.  Review of Systems  Constitutional: Negative.   HENT:  Negative.   Eyes: Negative.   Respiratory: Negative.   Cardiovascular: Negative.   Gastrointestinal: Positive for abdominal pain. Negative for vomiting and diarrhea.  Genitourinary: Negative.   Musculoskeletal: Negative.   Skin: Negative.   Neurological: Negative.   Endo/Heme/Allergies: Negative.   Psychiatric/Behavioral:       Anxiety   Blood pressure 110/75, pulse 76, temperature 98.1 F (36.7 C), resp. rate 17, height 5\' 2"  (1.575 m), weight 77.111 kg (170 lb), last menstrual period 12/13/2011, SpO2 99.00%. Physical Exam  Constitutional: She is oriented to person, place, and time. She appears well-developed and well-nourished. No distress.  HENT:  Head: Normocephalic and atraumatic.  Mouth/Throat: No oropharyngeal exudate.  Eyes: EOM are normal. Pupils are equal, round, and reactive to light. No scleral icterus.  Neck: Normal range of motion. No tracheal deviation present.  Cardiovascular: Normal rate, regular rhythm, normal heart sounds and intact distal pulses.   Respiratory: Effort normal and breath sounds normal. No respiratory distress. She has no wheezes. She has no rales.  GI: Soft. Bowel sounds are normal. She exhibits no distension. There is tenderness. There is no rebound and no guarding.       Very mild epigastric tenderness with no guarding Scar above the umbilicus  Musculoskeletal: Normal range of motion.  Neurological: She is alert and oriented to person, place, and time.  Skin: Skin is warm and dry.    Assessment/Plan: Symptomatic cholelithiasis: Patient will need elective cholecystectomy. She does not have acute cholecystitis at this time. She is very anxious about the prospect of surgery. She would like to go home and see [place] in the office. We spoke about cholecystectomy and  answered some questions. She is agreed to followup in our office. I spoke with the PA from the emergency department who will give a prescription for pain medication. I strongly encourage the  patient to remain on a low-fat diet until she has her surgery. Please have the patient call her office on Monday for an appointment at the next available spot.  Patty Valdez E 12/22/2011, 11:36 AM

## 2011-12-22 NOTE — ED Notes (Signed)
Called in to room. Pt reports pain came back to what it was initially 8/10.  Also c/o nausea. ED PA informed. Medicated per order.

## 2011-12-22 NOTE — ED Notes (Addendum)
C/o upper abd pain radiating into back. Seen in ED 1 week ago, is waiting for appt with surgeon at [facility]

## 2011-12-22 NOTE — ED Provider Notes (Signed)
History     CSN: [id number]  Arrival date & time 12/22/11  7829   First MD Initiated Contact with Patient 12/22/11 [phone]      Chief Complaint  Patient presents with  . Abdominal Pain    (Consider location/radiation/quality/duration/timing/severity/associated sxs/prior treatment) Patient is a 26 y.o. female presenting with abdominal pain. The history is provided by the patient.  Abdominal Pain The primary symptoms of the illness include abdominal pain, nausea and vomiting. The primary symptoms of the illness do not include fever, shortness of breath, diarrhea, dysuria, vaginal discharge or vaginal bleeding. The current episode started 1 to 2 hours ago. The onset of the illness was sudden. The problem has not changed since onset. The patient states that she believes she is currently not pregnant. The patient has not had a change in bowel habit. Additional symptoms associated with the illness include back pain. Symptoms associated with the illness do not include chills or constipation.   Pt with known hx cholelithiasis/biliary colic presents with upper abd pain which woke her from sleep this morning. She states her pain today is consistent with previous biliary colic. She is unable to describe the nature of the pain but states it radiates to her mid back. No known aggravating/alleviating factors.  She was seen here for the same on Jan 24th. US performed at that visit indicated cholelithiasis with mild dilatation of common bile duct. She was referred to surgery at that time but has not secured follow up yet. States she was also given rx for Protonix at that visit but it did not help her sx.  Past Medical History  Diagnosis Date  . Gallstones     History reviewed. No pertinent past surgical history.  No family history on file.  History  Substance Use Topics  . Smoking status: Current Everyday Smoker -- 0.5 packs/day for 10 years    Types: Cigarettes  . Smokeless tobacco: Not on file  .  Alcohol Use: Yes    OB History    Grav Para Term Preterm Abortions TAB SAB Ect Mult Living                  Review of Systems  Constitutional: Negative for fever, chills, activity change and appetite change.  Respiratory: Negative for shortness of breath.   Cardiovascular: Negative for chest pain.  Gastrointestinal: Positive for nausea, vomiting and abdominal pain. Negative for diarrhea, constipation, blood in stool, abdominal distention and rectal pain.  Genitourinary: Negative for dysuria, flank pain, vaginal bleeding and vaginal discharge.  Musculoskeletal: Positive for back pain.  Skin: Negative.   Neurological: Negative.   Psychiatric/Behavioral: Negative.     Allergies  Review of patient's allergies indicates no known allergies.  Home Medications   Current Outpatient Rx  Name Route Sig Dispense Refill  . CLONAZEPAM 1 MG PO TABS Oral Take 1 mg by mouth 2 (two) times daily as needed. For anxiety    . PANTOPRAZOLE SODIUM 20 MG PO TBEC Oral Take 1 tablet (20 mg total) by mouth daily. 30 tablet 0    BP 112/73  Pulse 83  Temp 98.1 F (36.7 C)  Resp 20  Ht 5\' 2"  (1.575 m)  Wt 170 lb (77.111 kg)  BMI 31.09 kg/m2  SpO2 99%  LMP 12/13/2011  Physical Exam  Nursing note and vitals reviewed. Constitutional: She is oriented to person, place, and time. She appears well-developed and well-nourished. No distress.  HENT:  Head: Normocephalic and atraumatic.  Eyes: EOM are normal.  Neck: Normal range of motion.  Cardiovascular: Normal rate, regular rhythm and normal heart sounds.  Exam reveals no gallop and no friction rub.   No murmur heard. Pulmonary/Chest: Effort normal and breath sounds normal. She has no wheezes. She has no rales. She exhibits no tenderness.  Abdominal: Soft. Bowel sounds are normal. There is no hepatosplenomegaly. There is tenderness in the right upper quadrant, epigastric area and left upper quadrant. There is no rigidity, no rebound, no guarding, no  CVA tenderness and negative Murphy's sign.  Musculoskeletal: Normal range of motion.  Neurological: She is alert and oriented to person, place, and time.  Skin: Skin is warm and dry. She is not diaphoretic.  Psychiatric: She has a normal mood and affect.    ED Course  Procedures (including critical care time)  Labs Reviewed  COMPREHENSIVE METABOLIC PANEL - Abnormal; Notable for the following:    Total Bilirubin 0.2 (*)    All other components within normal limits  URINALYSIS, WITH MICROSCOPIC - Abnormal; Notable for the following:    APPearance CLOUDY (*)    Hgb urine dipstick TRACE (*)    Leukocytes, UA MODERATE (*)    Bacteria, UA FEW (*)    Squamous Epithelial / LPF MANY (*)    All other components within normal limits  CBC  DIFFERENTIAL  LIPASE, BLOOD  POCT PREGNANCY, URINE   No results found. [place] report from 1/24 reviewed   1. Biliary colic       MDM  9:45 AM Pt with known gallstones presents with abd pain consistent with recent episodes of biliary colic. Plan to repeat labs to ensure there is no evidence for cholecystitis, gallstone pancreatitis, etc. Analgesics, antiemetics ordered. Will cont to monitor.  11:21 AM Pt continues to have pain; states that she initially had relief with 4 mg of morphine but the pain came back quickly. Will remedicate. Dr. Lynelle Doctor has discussed with general surgery; they plan to consult in the ED.  11:27 AM Findings discussed with pt. Dr. Janee Morn with surgery at bedside.   11:39 AM Discussed with Dr. Janee Morn. Please see his consult note. They plan to follow up next week in the office. Plan to manage pain and dc home with additional analgesics.    Grant Fontana, [place] 12/22/11 [phone]

## 2012-01-08 ENCOUNTER — Encounter (INDEPENDENT_AMBULATORY_CARE_PROVIDER_SITE_OTHER): Payer: Self-pay | Admitting: Surgery

## 2012-01-08 ENCOUNTER — Ambulatory Visit (INDEPENDENT_AMBULATORY_CARE_PROVIDER_SITE_OTHER): Payer: Medicaid Other | Admitting: Surgery

## 2012-01-08 VITALS — BP 126/82 | HR 68 | Temp 97.8°F | Resp 18 | Ht 62.0 in | Wt 175.2 lb

## 2012-01-08 DIAGNOSIS — F17200 Nicotine dependence, unspecified, uncomplicated: Secondary | ICD-10-CM

## 2012-01-08 DIAGNOSIS — K8010 Calculus of gallbladder with chronic cholecystitis without obstruction: Secondary | ICD-10-CM | POA: Insufficient documentation

## 2012-01-08 HISTORY — DX: Calculus of gallbladder with chronic cholecystitis without obstruction: K80.10

## 2012-01-08 NOTE — Progress Notes (Signed)
Subjective:     Patient ID: Patty Valdez, female   DOB: 02-Jan-1986, 26 y.o.   MRN: [id number]  HPI  Patty Valdez  [birth year] [id number]  Patient Care Team: Jearld Lesch as PCP - General (Specialist)  This patient is a 26 y.o.female who presents today for surgical evaluation.   Reason for visit: Abdominal pain. Probable gallbladder etiology  Patient is a young smoking female. She has had episodes of intermittent epigastric and right upper quadrant pain, radiating to her back. She's had severe pain.   She confesses she is eating "all the wrong things" such as fast food which tends to set it off. Some nausea. No vomiting. Regular bowel movements. No heartburn or reflux. Tried heartburn meds and it did not help.   She has gone to the ER a few times. She saw Dr. Janee Morn in the ED earlier in the month. He recommended setting up surgery. She rescheduled in clinic once. She was unable to come in this morning. We fit her it this afternoon.  She is very scared of surgery. She notes all her female relatives needed cholecystectomy. They seemed to have recovered from surgery relatively well.  Patient Active Problem List  Diagnoses  . HERPES SIMPLEX INFECTION  . OBESITY, UNSPECIFIED  . ANXIETY STATE, UNSPECIFIED  . NICOTINE ADDICTION  . IRREGULAR MENSES  . Chronic cholecystitis with calculus    Past Medical History  Diagnosis Date  . Gallstones   . Nausea   . Abdominal pain   . Vaginal bleeding     History reviewed. No pertinent past surgical history.  History   Social History  . Marital Status: Single    Spouse Name: N/A    Number of Children: N/A  . Years of Education: N/A   Occupational History  . Not on file.   Social History Main Topics  . Smoking status: Current Everyday Smoker -- 0.5 packs/day for 10 years    Types: Cigarettes  . Smokeless tobacco: Never Used  . Alcohol Use: Yes     6 pack per week  . Drug Use: Yes    Special: Marijuana  . Sexually  Active: Yes    Birth Control/ Protection: IUD   Other Topics Concern  . Not on file   Social History Narrative  . No narrative on file    Family History  Problem Relation Age of Onset  . Cancer Maternal Grandfather     colon    Current outpatient prescriptions:clonazePAM (KLONOPIN) 1 MG tablet, Take 1 mg by mouth 2 (two) times daily as needed. For anxiety, Disp: , Rfl: ;  pantoprazole (PROTONIX) 20 MG tablet, Take 1 tablet (20 mg total) by mouth daily., Disp: 30 tablet, Rfl: 0  No Known Allergies  BP 126/82  Pulse 68  Temp(Src) 97.8 F (36.6 C) (Temporal)  Resp 18  Ht 5\' 2"  (1.575 m)  Wt 175 lb 3.2 oz (79.47 kg)  BMI 32.04 kg/m2  LMP 12/13/2011     Review of Systems  Constitutional: Negative for fever, chills, diaphoresis, appetite change and fatigue.  HENT: Negative for ear pain, sore throat, trouble swallowing, neck pain and ear discharge.   Eyes: Negative for photophobia, discharge and visual disturbance.  Respiratory: Negative for cough, choking, chest tightness and shortness of breath.   Cardiovascular: Negative for chest pain and palpitations.  Gastrointestinal: Positive for nausea and abdominal pain. Negative for vomiting, diarrhea, constipation, blood in stool, anal bleeding and rectal pain.  No personal nor family history of GI/colon cancer, inflammatory bowel disease, irritable bowel syndrome, allergy such as Celiac Sprue, dietary/dairy problems, colitis, ulcers nor gastritis.    No recent sick contacts/gastroenteritis.  No travel outside the country.  No changes in diet.    Genitourinary: Negative for dysuria, frequency and difficulty urinating.  Musculoskeletal: Negative for myalgias and gait problem.  Skin: Negative for color change, pallor and rash.  Neurological: Negative for dizziness, speech difficulty, weakness and numbness.  Hematological: Negative for adenopathy.  Psychiatric/Behavioral: Negative for confusion and agitation. The patient is  nervous/anxious.        Objective:   Physical Exam  Constitutional: She is oriented to person, place, and time. She appears well-developed and well-nourished. No distress.  HENT:  Head: Normocephalic.  Mouth/Throat: Oropharynx is clear and moist. No oropharyngeal exudate.  Eyes: Conjunctivae and EOM are normal. Pupils are equal, round, and reactive to light. No scleral icterus.  Neck: Normal range of motion. Neck supple. No tracheal deviation present.  Cardiovascular: Normal rate, regular rhythm and intact distal pulses.   Pulmonary/Chest: Effort normal and breath sounds normal. No respiratory distress. She exhibits no tenderness.  Abdominal: Soft. She exhibits no distension and no mass. There is no rebound and no guarding. Hernia confirmed negative in the right inguinal area and confirmed negative in the left inguinal area.       Epigastric & RUQ mild TTP.  No Murphy's sign  Genitourinary: No vaginal discharge found.  Musculoskeletal: Normal range of motion. She exhibits no tenderness.  Lymphadenopathy:    She has no cervical adenopathy.       Right: No inguinal adenopathy present.       Left: No inguinal adenopathy present.  Neurological: She is alert and oriented to person, place, and time. No cranial nerve deficit. She exhibits normal muscle tone. Coordination normal.  Skin: Skin is warm and dry. No rash noted. She is not diaphoretic. No erythema.  Psychiatric: She has a normal mood and affect. Her behavior is normal. Judgment and thought content normal.       Assessment:     +GS, prob chronic cholecystitis.  DiffDX o/w unlikely    Plan:     Given the fact that the differential diagnosis is otherwise unlikely, I think she would benefit from surgery. I think she has waited a while. She is scared of surgery but I tried to reassure the purpose is to help fix her problems such as pain and nausea. Avoid an emergency.  She feels more ready to face it.  The anatomy & physiology of  hepatobiliary & pancreatic function was discussed.  The pathophysiology of gallbladder dysfunction was discussed.  Natural history risks without surgery was discussed.   I feel the risks of no intervention will lead to serious problems that outweigh the operative risks; therefore, I recommended cholecystectomy to remove the pathology.  I explained laparoscopic techniques with possible need for an open approach.  Probable cholangiogram to evaluate the bilary tract was explained as well.    Risks such as bleeding, infection, abscess, leak, injury to other organs, need for further treatment, heart attack, death, and other risks were discussed.  I noted a good likelihood this will help address the problem.  Possibility that this will not correct all abdominal symptoms was explained.  Goals of post-operative recovery were discussed as well.  We will work to minimize complications.  An educational handout further explaining the pathology and treatment options was given as well.  Questions were  answered.  The patient expresses understanding & wishes to proceed with surgery.  Clears, NSAIDS, heat for now.  We talked to the patient about the dangers of smoking.  We stressed that tobacco use dramatically increases the risk of peri-operative complications such as infection, tissue necrosis leaving to problems with incision/wound and organ healing, heart attack, stroke, DVT, pulmonary embolism, and death.  We noted there are programs in our community to help stop smoking.

## 2012-01-08 NOTE — Patient Instructions (Addendum)
Gallbladder Disease Gallbladder disease (cholecystitis) is an inflammation of your gallbladder. It is usually caused by a build-up of stones (gallstones) or sludge (cholelithiasis) in your gallbladder. The gallbladder is not an essential organ. It is located slightly to the right of center in the belly (abdomen), behind the liver. It stores bile made in the liver. Bile aids in digestion of fats. Gallbladder disease may result in nausea (feeling sick to your stomach), abdominal pain, and jaundice. In severe cases, emergency surgery may be required. The most common type of gallbladder disease is gallstones. They begin as small crystals and slowly grow into stones. Gallstone pain occurs when the bile duct has spasms. The spasms are caused by the stone passing out of the duct. The stone is trying to pass at the same time bile is passing into the small bowel for digestion. The pain usually begins suddenly. It may persist from several minutes to several hours. Infection can occur. Infection can add to discomfort and severity of an acute attack. The pain may be made worse by breathing deeply or by being jarred. There may be fever and tenderness to the touch. In some cases, when gallstones do not move into the bile duct, people have no pain or symptoms. These are called "silent" gallstones. Women are three times more likely to develop gallstones than men. Women who have had several pregnancies are more likely to have gallbladder disease. Physicians sometimes advise removing diseased gallbladders before future pregnancies. Other factors that increase the risk of gallbladder disease are obesity, diets heavy in fried foods and dairy products, increasing age, prolonged use of medications containing female hormones, and heredity. HOME CARE INSTRUCTIONS   If your physician prescribed an antibiotic, take as directed.   Only take over-the-counter or prescription medicines for pain, discomfort, or fever as directed by your  caregiver.   Follow a low fat diet until seen again. (Fat causes the gallbladder to contract.)   Follow-up as instructed. Attacks are almost always recurrent and surgery is usually required for permanent treatment.  SEEK IMMEDIATE MEDICAL CARE IF:   Pain is increasing and not controlled by medications.   The pain moves to another part of your abdomen or to your back. (Right sided pain can be appendicitis and left sided pain in adults can be diverticulitis).   You have a fever.   You develop nausea and vomiting.  Document Released: 11/05/2005 Document Revised: 07/18/2011 Document Reviewed: 06/24/2008 [facility] Patient Information 2012 [place], [place].  Smoking Cessation This document explains the best ways for you to quit smoking and new treatments to help. It lists new medicines that can double or triple your chances of quitting and quitting for good. It also considers ways to avoid relapses and concerns you may have about quitting, including weight gain. NICOTINE: A POWERFUL ADDICTION If you have tried to quit smoking, you know how hard it can be. It is hard because nicotine is a very addictive drug. For some people, it can be as addictive as heroin or cocaine. Usually, people make 2 or 3 tries, or more, before finally being able to quit. Each time you try to quit, you can learn about what helps and what hurts. Quitting takes hard work and a lot of effort, but you can quit smoking. QUITTING SMOKING IS ONE OF THE MOST IMPORTANT THINGS YOU WILL EVER DO.  You will live longer, feel better, and live better.   The impact on your body of quitting smoking is felt almost immediately:   Within  20 minutes, blood pressure decreases. Pulse returns to its normal level.   After 8 hours, carbon monoxide levels in the blood return to normal. Oxygen level increases.   After 24 hours, chance of heart attack starts to decrease. Breath, hair, and body stop smelling like smoke.   After 48 hours, damaged  nerve endings begin to recover. Sense of taste and smell improve.   After 72 hours, the body is virtually free of nicotine. Bronchial tubes relax and breathing becomes easier.   After 2 to 12 weeks, lungs can hold more air. Exercise becomes easier and circulation improves.   Quitting will reduce your risk of having a heart attack, stroke, cancer, or lung disease:   After 1 year, the risk of coronary heart disease is cut in half.   After 5 years, the risk of stroke falls to the same as a nonsmoker.   After 10 years, the risk of lung cancer is cut in half and the risk of other cancers decreases significantly.   After 15 years, the risk of coronary heart disease drops, usually to the level of a nonsmoker.   If you are pregnant, quitting smoking will improve your chances of having a healthy baby.   The people you live with, especially your children, will be healthier.   You will have extra money to spend on things other than cigarettes.  FIVE KEYS TO QUITTING Studies have shown that these 5 steps will help you quit smoking and quit for good. You have the best chances of quitting if you use them together: 1. Get ready.  2. Get support and encouragement.  3. Learn new skills and behaviors.  4. Get medicine to reduce your nicotine addiction and use it correctly.  5. Be prepared for relapse or difficult situations. Be determined to continue trying to quit, even if you do not succeed at first.  1. GET READY  Set a quit date.   Change your environment.   Get rid of ALL cigarettes, ashtrays, matches, and lighters in your home, car, and place of work.   Do not let people smoke in your home.   Review your past attempts to quit. Think about what worked and what did not.   Once you quit, do not smoke. NOT EVEN A PUFF!  2. GET SUPPORT AND ENCOURAGEMENT Studies have shown that you have a better chance of being successful if you have help. You can get support in many ways.  Tell your  family, friends, and coworkers that you are going to quit and need their support. Ask them not to smoke around you.   Talk to your caregivers (doctor, dentist, nurse, pharmacist, psychologist, and/or smoking counselor).   Get individual, group, or telephone counseling and support. The more counseling you have, the better your chances are of quitting. Programs are available at [employer] and health centers. Call your local health department for information about programs in your area.   Spiritual beliefs and practices may help some smokers quit.   Quit meters are Photographer that keep track of quit statistics, such as amount of "quit-time," cigarettes not smoked, and money saved.   Many smokers find one or more of the many self-help books available useful in helping them quit and stay off tobacco.  3. LEARN NEW SKILLS AND BEHAVIORS  Try to distract yourself from urges to smoke. Talk to someone, go for a walk, or occupy your time with a task.   When you  first try to quit, change your routine. Take a different route to work. Drink tea instead of coffee. Eat breakfast in a different place.   Do something to reduce your stress. Take a hot bath, exercise, or read a book.   Plan something enjoyable to do every day. Reward yourself for not smoking.   Explore interactive web-based programs that specialize in helping you quit.  4. GET MEDICINE AND USE IT CORRECTLY Medicines can help you stop smoking and decrease the urge to smoke. Combining medicine with the above behavioral methods and support can quadruple your chances of successfully quitting smoking. The [place] Food and Drug Administration (FDA) has approved 7 medicines to help you quit smoking. These medicines fall into 3 categories.  Nicotine replacement therapy (delivers nicotine to your body without the negative effects and risks of smoking):   Nicotine gum: Available over-the-counter.   Nicotine  lozenges: Available over-the-counter.   Nicotine inhaler: Available by prescription.   Nicotine nasal spray: Available by prescription.   Nicotine skin patches (transdermal): Available by prescription and over-the-counter.   Antidepressant medicine (helps people abstain from smoking, but how this works is unknown):   Bupropion sustained-release (SR) tablets: Available by prescription.   Nicotinic receptor partial agonist (simulates the effect of nicotine in your brain):   Varenicline tartrate tablets: Available by prescription.   Ask your caregiver for advice about which medicines to use and how to use them. Carefully read the information on the package.   Everyone who is trying to quit may benefit from using a medicine. If you are pregnant or trying to become pregnant, nursing an infant, you are under age 22, or you smoke fewer than 10 cigarettes per day, talk to your caregiver before taking any nicotine replacement medicines.   You should stop using a nicotine replacement product and call your caregiver if you experience nausea, dizziness, weakness, vomiting, fast or irregular heartbeat, mouth problems with the lozenge or gum, or redness or swelling of the skin around the patch that does not go away.   Do not use any other product containing nicotine while using a nicotine replacement product.   Talk to your caregiver before using these products if you have diabetes, heart disease, asthma, stomach ulcers, you had a recent heart attack, you have high blood pressure that is not controlled with medicine, a history of irregular heartbeat, or you have been prescribed medicine to help you quit smoking.  5. BE PREPARED FOR RELAPSE OR DIFFICULT SITUATIONS  Most relapses occur within the first 3 months after quitting. Do not be discouraged if you start smoking again. Remember, most people try several times before they finally quit.   You may have symptoms of withdrawal because your body is used  to nicotine. You may crave cigarettes, be irritable, feel very hungry, cough often, get headaches, or have difficulty concentrating.   The withdrawal symptoms are only temporary. They are strongest when you first quit, but they will go away within 10 to 14 days.  Here are some difficult situations to watch for:  Alcohol. Avoid drinking alcohol. Drinking lowers your chances of successfully quitting.   Caffeine. Try to reduce the amount of caffeine you consume. It also lowers your chances of successfully quitting.   Other smokers. Being around smoking can make you want to smoke. Avoid smokers.   Weight gain. Many smokers will gain weight when they quit, usually less than 10 pounds. Eat a healthy diet and stay active. Do not let weight gain distract  you from your main goal, quitting smoking. Some medicines that help you quit smoking may also help delay weight gain. You can always lose the weight gained after you quit.   Bad mood or depression. There are a lot of ways to improve your mood other than smoking.  If you are having problems with any of these situations, talk to your caregiver. SPECIAL SITUATIONS AND CONDITIONS Studies suggest that everyone can quit smoking. Your situation or condition can give you a special reason to quit.  Pregnant women/new mothers: By quitting, you protect your baby's health and your own.   Hospitalized patients: By quitting, you reduce health problems and help healing.   Heart attack patients: By quitting, you reduce your risk of a second heart attack.   Lung, head, and neck cancer patients: By quitting, you reduce your chance of a second cancer.   Parents of children and adolescents: By quitting, you protect your children from illnesses caused by secondhand smoke.  QUESTIONS TO THINK ABOUT Think about the following questions before you try to stop smoking. You may want to talk about your answers with your caregiver.  Why do you want to quit?   If you  tried to quit in the past, what helped and what did not?   What will be the most difficult situations for you after you quit? How will you plan to handle them?   Who can help you through the tough times? Your family? Friends? Caregiver?   What pleasures do you get from smoking? What ways can you still get pleasure if you quit?  Here are some questions to ask your caregiver:  How can you help me to be successful at quitting?   What medicine do you think would be best for me and how should I take it?   What should I do if I need more help?   What is smoking withdrawal like? How can I get information on withdrawal?  Quitting takes hard work and a lot of effort, but you can quit smoking. FOR MORE INFORMATION  Smokefree.gov (http://www.davis-sullivan.com/) provides free, accurate, evidence-based information and professional assistance to help support the immediate and long-term needs of people trying to quit smoking. Document Released: 10/30/2001 Document Revised: 07/18/2011 Document Reviewed: 08/22/2009 [employer]  Managing Pain  Pain after surgery or related to activity is often due to strain/injury to muscle, tendon, nerves and/or incisions.  This pain is usually short-term and will improve in a few months.   Many people find it helpful to do the following things TOGETHER to help speed the process of healing and to get back to regular activity more quickly:  1. Avoid heavy physical activity a.  no lifting greater than 20 pounds b. Do not "push through" the pain.  Listen to your body and avoid positions and maneuvers than reproduce the pain c. Walking is okay as tolerated, but go slowly and stop when getting sore.  d. Remember: If it hurts to do it, then don't do it! 2. Take Anti-inflammatory medication  a. Take with food/snack around the clock for 1-2 weeks i. This helps the muscle and nerve tissues become less irritable and calm down faster b. Choose ONE of the following over-the-counter  medications: i. Naproxen 220mg  tabs (ex. Aleve) 1-2 pills twice a day  ii. Ibuprofen 200mg  tabs (ex. Advil, Motrin) 3-4 pills with every meal and just before bedtime iii. Acetaminophen 500mg  tabs (Tylenol) 1-2 pills with every meal and just before bedtime 3. Use a Heating pad  or Ice/Cold Pack a. 4-6 times a day b. May use warm bath/hottub  or showers 4. Try Gentle Massage and/or Stretching  a. at the area of pain many times a day b. stop if you feel pain - do not overdo it  Try these steps together to help you body heal faster and avoid making things get worse.  Doing just one of these things may not be enough.    If you are not getting better after two weeks or are noticing you are getting worse, contact our office for further advice; we may need to re-evaluate you & see what other things we can do to help.   Patient Information 2012 [place], [place].

## 2012-01-17 ENCOUNTER — Encounter (HOSPITAL_COMMUNITY): Payer: Self-pay | Admitting: Pharmacy Technician

## 2012-01-21 ENCOUNTER — Encounter (HOSPITAL_COMMUNITY)
Admission: RE | Admit: 2012-01-21 | Discharge: 2012-01-21 | Disposition: A | Discharge status: Home / Self Care | Payer: Medicaid Other | Source: Ambulatory Visit | Attending: Surgery | Admitting: Surgery

## 2012-01-21 ENCOUNTER — Encounter (HOSPITAL_COMMUNITY): Payer: Self-pay

## 2012-01-21 ENCOUNTER — Inpatient Hospital Stay (HOSPITAL_COMMUNITY): Admission: RE | Admit: 2012-01-21 | Payer: Medicaid Other | Source: Ambulatory Visit

## 2012-01-21 HISTORY — DX: Adverse effect of unspecified anesthetic, initial encounter: T41.45XA

## 2012-01-21 HISTORY — DX: Anxiety disorder, unspecified: F41.9

## 2012-01-21 HISTORY — DX: Anemia, unspecified: D64.9

## 2012-01-21 LAB — CBC
MCH: 31.9 pg (ref 26.0–34.0)
Platelets: 272 10*3/uL (ref 150–400)
RBC: 3.86 MIL/uL — ABNORMAL LOW (ref 3.87–5.11)
RDW: 12.9 % (ref 11.5–15.5)
WBC: 8.1 10*3/uL (ref 4.0–10.5)

## 2012-01-21 LAB — SURGICAL PCR SCREEN: MRSA, PCR: NEGATIVE

## 2012-01-21 NOTE — Patient Instructions (Signed)
20 CHARENE MCCALLISTER  01/21/2012   Your procedure is scheduled on:  01-22-12  Report to [facility] at  0720 AM.  Call this number if you have problems the morning of surgery: [phone]   Remember:   Do not eat food or drink liquids:After Midnight.  .  Take these medicines the morning of surgery with A SIP OF WATER: klonopin if needed   Do not wear jewelry or make up.  Do not wear lotions, powders, or perfumes.Do not wear deodorant.    Do not bring valuables to the hospital.  Contacts, dentures or bridgework may not be worn into surgery.  Leave suitcase in the car. After surgery it may be brought to your room.  For patients admitted to the hospital, checkout time is 11:00 AM the day of discharge.     Special Instructions: CHG Shower Use Special Wash: 1/2 bottle night before surgery and 1/2 bottle morning of surgery.neck down avoid private area, no shaving   Please read over the following fact sheets that you were given: MRSA Information  [facility] pre op nurse phone number [phone], call if needed

## 2012-01-22 ENCOUNTER — Ambulatory Visit (HOSPITAL_COMMUNITY): Payer: Medicaid Other | Admitting: Anesthesiology

## 2012-01-22 ENCOUNTER — Encounter (HOSPITAL_COMMUNITY)
Admission: RE | Disposition: A | Discharge status: Home / Self Care | Payer: Self-pay | Source: Ambulatory Visit | Attending: Surgery

## 2012-01-22 ENCOUNTER — Encounter (HOSPITAL_COMMUNITY): Payer: Self-pay | Admitting: Anesthesiology

## 2012-01-22 ENCOUNTER — Ambulatory Visit (HOSPITAL_COMMUNITY)
Admission: RE | Admit: 2012-01-22 | Discharge: 2012-01-22 | Disposition: A | Discharge status: Home / Self Care | Payer: Medicaid Other | Source: Ambulatory Visit | Attending: Surgery | Admitting: Surgery

## 2012-01-22 ENCOUNTER — Ambulatory Visit (HOSPITAL_COMMUNITY): Payer: Medicaid Other

## 2012-01-22 ENCOUNTER — Encounter (HOSPITAL_COMMUNITY): Payer: Self-pay | Admitting: *Deleted

## 2012-01-22 DIAGNOSIS — R110 Nausea: Secondary | ICD-10-CM | POA: Insufficient documentation

## 2012-01-22 DIAGNOSIS — K8010 Calculus of gallbladder with chronic cholecystitis without obstruction: Secondary | ICD-10-CM

## 2012-01-22 DIAGNOSIS — Z01812 Encounter for preprocedural laboratory examination: Secondary | ICD-10-CM | POA: Insufficient documentation

## 2012-01-22 DIAGNOSIS — R1011 Right upper quadrant pain: Secondary | ICD-10-CM | POA: Insufficient documentation

## 2012-01-22 DIAGNOSIS — R109 Unspecified abdominal pain: Secondary | ICD-10-CM | POA: Insufficient documentation

## 2012-01-22 DIAGNOSIS — M549 Dorsalgia, unspecified: Secondary | ICD-10-CM | POA: Insufficient documentation

## 2012-01-22 DIAGNOSIS — R1013 Epigastric pain: Secondary | ICD-10-CM | POA: Insufficient documentation

## 2012-01-22 SURGERY — LAPAROSCOPIC CHOLECYSTECTOMY SINGLE SITE
Anesthesia: General | Site: Abdomen | Wound class: Clean Contaminated

## 2012-01-22 MED ORDER — ACETAMINOPHEN 10 MG/ML IV SOLN
INTRAVENOUS | Status: DC | PRN
  Administered 2012-01-22: 1000 mg via INTRAVENOUS

## 2012-01-22 MED ORDER — DEXAMETHASONE SODIUM PHOSPHATE 10 MG/ML IJ SOLN
INTRAMUSCULAR | Status: DC | PRN
  Administered 2012-01-22: 10 mg via INTRAVENOUS

## 2012-01-22 MED ORDER — MIDAZOLAM HCL 5 MG/5ML IJ SOLN
INTRAMUSCULAR | Status: DC | PRN
  Administered 2012-01-22: 2 mg via INTRAVENOUS

## 2012-01-22 MED ORDER — GLYCOPYRROLATE 0.2 MG/ML IJ SOLN
INTRAMUSCULAR | Status: DC | PRN
  Administered 2012-01-22: 0.4 mg via INTRAVENOUS

## 2012-01-22 MED ORDER — SUCCINYLCHOLINE CHLORIDE 20 MG/ML IJ SOLN
INTRAMUSCULAR | Status: DC | PRN
  Administered 2012-01-22: 100 mg via INTRAVENOUS

## 2012-01-22 MED ORDER — SODIUM CHLORIDE 0.9 % IR SOLN
Status: DC | PRN
  Administered 2012-01-22: 1000 mL

## 2012-01-22 MED ORDER — HYDROMORPHONE HCL PF 1 MG/ML IJ SOLN
0.2500 mg | INTRAMUSCULAR | Status: DC | PRN
  Administered 2012-01-22 (×4): 0.25 mg via INTRAVENOUS

## 2012-01-22 MED ORDER — PROPOFOL 10 MG/ML IV BOLUS
INTRAVENOUS | Status: DC | PRN
  Administered 2012-01-22: 170 mg via INTRAVENOUS

## 2012-01-22 MED ORDER — MORPHINE SULFATE (PF) 1 MG/ML IV SOLN
INTRAVENOUS | Status: AC
  Filled 2012-01-22: qty 25

## 2012-01-22 MED ORDER — BUPIVACAINE-EPINEPHRINE PF 0.25-1:200000 % IJ SOLN
INTRAMUSCULAR | Status: DC | PRN
  Administered 2012-01-22: 50 mL

## 2012-01-22 MED ORDER — SODIUM CHLORIDE 0.9 % IJ SOLN
INTRAMUSCULAR | Status: DC | PRN
  Administered 2012-01-22: 10:00:00

## 2012-01-22 MED ORDER — LACTATED RINGERS IV SOLN: INTRAVENOUS | Status: DC

## 2012-01-22 MED ORDER — ONDANSETRON HCL 4 MG/2ML IJ SOLN
INTRAMUSCULAR | Status: DC | PRN
  Administered 2012-01-22: 4 mg via INTRAVENOUS

## 2012-01-22 MED ORDER — NEOSTIGMINE METHYLSULFATE 1 MG/ML IJ SOLN
INTRAMUSCULAR | Status: DC | PRN
  Administered 2012-01-22: 3 mg via INTRAVENOUS

## 2012-01-22 MED ORDER — OXYCODONE HCL 5 MG PO TABS: 5.0000 mg | ORAL_TABLET | ORAL | Status: AC | PRN

## 2012-01-22 MED ORDER — MIDAZOLAM HCL 2 MG/2ML IJ SOLN
0.5000 mg | INTRAMUSCULAR | Status: DC | PRN
  Administered 2012-01-22: 2 mg via INTRAVENOUS

## 2012-01-22 MED ORDER — LACTATED RINGERS IV SOLN
INTRAVENOUS | Status: DC
  Administered 2012-01-22: 1000 mL via INTRAVENOUS
  Administered 2012-01-22: 11:00:00 via INTRAVENOUS

## 2012-01-22 MED ORDER — ACETAMINOPHEN 10 MG/ML IV SOLN
INTRAVENOUS | Status: AC
  Filled 2012-01-22: qty 100

## 2012-01-22 MED ORDER — IOHEXOL 300 MG/ML  SOLN
INTRAMUSCULAR | Status: AC
  Filled 2012-01-22: qty 1

## 2012-01-22 MED ORDER — SODIUM CHLORIDE 0.9 % IV SOLN
3.0000 g | INTRAVENOUS | Status: AC
  Administered 2012-01-22: 3 g via INTRAVENOUS

## 2012-01-22 MED ORDER — BUPIVACAINE-EPINEPHRINE 0.25% -1:200000 IJ SOLN
INTRAMUSCULAR | Status: AC
  Filled 2012-01-22: qty 1

## 2012-01-22 MED ORDER — MIDAZOLAM HCL 2 MG/2ML IJ SOLN
INTRAMUSCULAR | Status: AC
  Filled 2012-01-22: qty 2

## 2012-01-22 MED ORDER — OXYCODONE HCL 5 MG PO TABS
ORAL_TABLET | ORAL | Status: AC
  Administered 2012-01-22: 5 mg via ORAL
  Filled 2012-01-22: qty 1

## 2012-01-22 MED ORDER — HYDROMORPHONE HCL PF 1 MG/ML IJ SOLN
INTRAMUSCULAR | Status: AC
  Filled 2012-01-22: qty 1

## 2012-01-22 MED ORDER — FENTANYL CITRATE 0.05 MG/ML IJ SOLN
INTRAMUSCULAR | Status: DC | PRN
  Administered 2012-01-22 (×2): 50 ug via INTRAVENOUS
  Administered 2012-01-22: 100 ug via INTRAVENOUS

## 2012-01-22 MED ORDER — SODIUM CHLORIDE 0.9 % IV SOLN: 3.0000 g | INTRAVENOUS | Status: DC

## 2012-01-22 MED ORDER — ROCURONIUM BROMIDE 100 MG/10ML IV SOLN
INTRAVENOUS | Status: DC | PRN
  Administered 2012-01-22: 30 mg via INTRAVENOUS

## 2012-01-22 SURGICAL SUPPLY — 42 items
APPLIER CLIP ROT 10 11.4 M/L (STAPLE) ×3
APR CLP MED LRG 11.4X10 (STAPLE) ×2
BAG SPEC RTRVL LRG 6X4 10 (ENDOMECHANICALS)
CABLE HIGH FREQUENCY MONO STRZ (ELECTRODE) ×3 IMPLANT
CANISTER SUCTION 2500CC (MISCELLANEOUS) ×3 IMPLANT
CLIP APPLIE ROT 10 11.4 M/L (STAPLE) ×2 IMPLANT
CLOTH BEACON ORANGE TIMEOUT ST (SAFETY) ×3 IMPLANT
COVER MAYO STAND STRL (DRAPES) ×3 IMPLANT
DECANTER SPIKE VIAL GLASS SM (MISCELLANEOUS) ×3 IMPLANT
DRAPE C-ARM 42X72 X-RAY (DRAPES) ×3 IMPLANT
DRAPE LAPAROSCOPIC ABDOMINAL (DRAPES) ×3 IMPLANT
DRSG TEGADERM 2-3/8X2-3/4 SM (GAUZE/BANDAGES/DRESSINGS) ×6 IMPLANT
DRSG TEGADERM 4X4.75 (GAUZE/BANDAGES/DRESSINGS) ×4 IMPLANT
ELECT REM PT RETURN 9FT ADLT (ELECTROSURGICAL) ×3
ELECTRODE REM PT RTRN 9FT ADLT (ELECTROSURGICAL) ×2 IMPLANT
GAUZE SPONGE 2X2 8PLY STRL LF (GAUZE/BANDAGES/DRESSINGS) ×1 IMPLANT
GLOVE BIOGEL PI IND STRL 7.0 (GLOVE) ×1 IMPLANT
GLOVE BIOGEL PI INDICATOR 7.0 (GLOVE)
GLOVE ECLIPSE 8.0 STRL XLNG CF (GLOVE) ×3 IMPLANT
GLOVE INDICATOR 8.0 STRL GRN (GLOVE) ×3 IMPLANT
GOWN STRL NON-REIN LRG LVL3 (GOWN DISPOSABLE) ×3 IMPLANT
GOWN STRL REIN XL XLG (GOWN DISPOSABLE) ×6 IMPLANT
IV LACTATED RINGER IRRG 3000ML (IV SOLUTION) ×3
IV LR IRRIG 3000ML ARTHROMATIC (IV SOLUTION) ×2 IMPLANT
KIT BASIN OR (CUSTOM PROCEDURE TRAY) ×3 IMPLANT
NS IRRIG 1000ML POUR BTL (IV SOLUTION) ×3 IMPLANT
POUCH SPECIMEN RETRIEVAL 10MM (ENDOMECHANICALS) IMPLANT
SCISSORS LAP 5X35 DISP (ENDOMECHANICALS) ×3 IMPLANT
SET CHOLANGIOGRAPH MIX (MISCELLANEOUS) ×2 IMPLANT
SET IRRIG TUBING LAPAROSCOPIC (IRRIGATION / IRRIGATOR) ×3 IMPLANT
SLEEVE Z-THREAD 5X100MM (TROCAR) ×3 IMPLANT
SOLUTION ANTI FOG 6CC (MISCELLANEOUS) ×3 IMPLANT
SPONGE GAUZE 2X2 STER 10/PKG (GAUZE/BANDAGES/DRESSINGS) ×1
STRIP CLOSURE SKIN 1/2X4 (GAUZE/BANDAGES/DRESSINGS) ×1 IMPLANT
SUT MNCRL AB 4-0 PS2 18 (SUTURE) ×3 IMPLANT
SUT VICRYL 0 TIES 12 18 (SUTURE) ×3 IMPLANT
TOWEL OR 17X26 10 PK STRL BLUE (TOWEL DISPOSABLE) ×9 IMPLANT
TRAY LAP CHOLE (CUSTOM PROCEDURE TRAY) ×3 IMPLANT
TROCAR 5M 150ML BLDLS (TROCAR) ×3 IMPLANT
TROCAR Z-THREAD FIOS 11X100 BL (TROCAR) ×1 IMPLANT
TROCAR Z-THREAD FIOS 5X100MM (TROCAR) ×1 IMPLANT
TUBING INSUFFLATION 10FT LAP (TUBING) ×3 IMPLANT

## 2012-01-22 NOTE — H&P (View-Only) (Signed)
Subjective:     Patient ID: Patty Valdez, female   DOB: 11/06/1986, 25 y.o.   MRN: [id number]  HPI  Patty Valdez  12/26/1985 [id number]  Patient Care Team: Dwight M Williams as PCP - General (Specialist)  This patient is a 25 y.o.female who presents today for surgical evaluation.   Reason for visit: Abdominal pain. Probable gallbladder etiology  Patient is a young smoking female. She has had episodes of intermittent epigastric and right upper quadrant pain, radiating to her back. She's had severe pain.   She confesses she is eating "all the wrong things" such as fast food which tends to set it off. Some nausea. No vomiting. Regular bowel movements. No heartburn or reflux. Tried heartburn meds and it did not help.   She has gone to the ER a few times. She saw Dr. Thompson in the ED earlier in the month. He recommended setting up surgery. She rescheduled in clinic once. She was unable to come in this morning. We fit her it this afternoon.  She is very scared of surgery. She notes all her female relatives needed cholecystectomy. They seemed to have recovered from surgery relatively well.  Patient Active Problem List  Diagnoses  . HERPES SIMPLEX INFECTION  . OBESITY, UNSPECIFIED  . ANXIETY STATE, UNSPECIFIED  . NICOTINE ADDICTION  . IRREGULAR MENSES  . Chronic cholecystitis with calculus    Past Medical History  Diagnosis Date  . Gallstones   . Nausea   . Abdominal pain   . Vaginal bleeding     History reviewed. No pertinent past surgical history.  History   Social History  . Marital Status: Single    Spouse Name: N/A    Number of Children: N/A  . Years of Education: N/A   Occupational History  . Not on file.   Social History Main Topics  . Smoking status: Current Everyday Smoker -- 0.5 packs/day for 10 years    Types: Cigarettes  . Smokeless tobacco: Never Used  . Alcohol Use: Yes     6 pack per week  . Drug Use: Yes    Special: Marijuana  . Sexually  Active: Yes    Birth Control/ Protection: IUD   Other Topics Concern  . Not on file   Social History Narrative  . No narrative on file    Family History  Problem Relation Age of Onset  . Cancer Maternal Grandfather     colon    Current outpatient prescriptions:clonazePAM (KLONOPIN) 1 MG tablet, Take 1 mg by mouth 2 (two) times daily as needed. For anxiety, Disp: , Rfl: ;  pantoprazole (PROTONIX) 20 MG tablet, Take 1 tablet (20 mg total) by mouth daily., Disp: 30 tablet, Rfl: 0  No Known Allergies  BP 126/82  Pulse 68  Temp(Src) 97.8 F (36.6 C) (Temporal)  Resp 18  Ht 5' 2" (1.575 m)  Wt 175 lb 3.2 oz (79.47 kg)  BMI 32.04 kg/m2  LMP 12/13/2011     Review of Systems  Constitutional: Negative for fever, chills, diaphoresis, appetite change and fatigue.  HENT: Negative for ear pain, sore throat, trouble swallowing, neck pain and ear discharge.   Eyes: Negative for photophobia, discharge and visual disturbance.  Respiratory: Negative for cough, choking, chest tightness and shortness of breath.   Cardiovascular: Negative for chest pain and palpitations.  Gastrointestinal: Positive for nausea and abdominal pain. Negative for vomiting, diarrhea, constipation, blood in stool, anal bleeding and rectal pain.         No personal nor family history of GI/colon cancer, inflammatory bowel disease, irritable bowel syndrome, allergy such as Celiac Sprue, dietary/dairy problems, colitis, ulcers nor gastritis.    No recent sick contacts/gastroenteritis.  No travel outside the country.  No changes in diet.    Genitourinary: Negative for dysuria, frequency and difficulty urinating.  Musculoskeletal: Negative for myalgias and gait problem.  Skin: Negative for color change, pallor and rash.  Neurological: Negative for dizziness, speech difficulty, weakness and numbness.  Hematological: Negative for adenopathy.  Psychiatric/Behavioral: Negative for confusion and agitation. The patient is  nervous/anxious.        Objective:   Physical Exam  Constitutional: She is oriented to person, place, and time. She appears well-developed and well-nourished. No distress.  HENT:  Head: Normocephalic.  Mouth/Throat: Oropharynx is clear and moist. No oropharyngeal exudate.  Eyes: Conjunctivae and EOM are normal. Pupils are equal, round, and reactive to light. No scleral icterus.  Neck: Normal range of motion. Neck supple. No tracheal deviation present.  Cardiovascular: Normal rate, regular rhythm and intact distal pulses.   Pulmonary/Chest: Effort normal and breath sounds normal. No respiratory distress. She exhibits no tenderness.  Abdominal: Soft. She exhibits no distension and no mass. There is no rebound and no guarding. Hernia confirmed negative in the right inguinal area and confirmed negative in the left inguinal area.       Epigastric & RUQ mild TTP.  No Murphy's sign  Genitourinary: No vaginal discharge found.  Musculoskeletal: Normal range of motion. She exhibits no tenderness.  Lymphadenopathy:    She has no cervical adenopathy.       Right: No inguinal adenopathy present.       Left: No inguinal adenopathy present.  Neurological: She is alert and oriented to person, place, and time. No cranial nerve deficit. She exhibits normal muscle tone. Coordination normal.  Skin: Skin is warm and dry. No rash noted. She is not diaphoretic. No erythema.  Psychiatric: She has a normal mood and affect. Her behavior is normal. Judgment and thought content normal.       Assessment:     +GS, prob chronic cholecystitis.  DiffDX o/w unlikely    Plan:     Given the fact that the differential diagnosis is otherwise unlikely, I think she would benefit from surgery. I think she has waited a while. She is scared of surgery but I tried to reassure the purpose is to help fix her problems such as pain and nausea. Avoid an emergency.  She feels more ready to face it.  The anatomy & physiology of  hepatobiliary & pancreatic function was discussed.  The pathophysiology of gallbladder dysfunction was discussed.  Natural history risks without surgery was discussed.   I feel the risks of no intervention will lead to serious problems that outweigh the operative risks; therefore, I recommended cholecystectomy to remove the pathology.  I explained laparoscopic techniques with possible need for an open approach.  Probable cholangiogram to evaluate the bilary tract was explained as well.    Risks such as bleeding, infection, abscess, leak, injury to other organs, need for further treatment, heart attack, death, and other risks were discussed.  I noted a good likelihood this will help address the problem.  Possibility that this will not correct all abdominal symptoms was explained.  Goals of post-operative recovery were discussed as well.  We will work to minimize complications.  An educational handout further explaining the pathology and treatment options was given as well.  Questions were   answered.  The patient expresses understanding & wishes to proceed with surgery.  Clears, NSAIDS, heat for now.  We talked to the patient about the dangers of smoking.  We stressed that tobacco use dramatically increases the risk of peri-operative complications such as infection, tissue necrosis leaving to problems with incision/wound and organ healing, heart attack, stroke, DVT, pulmonary embolism, and death.  We noted there are programs in our community to help stop smoking.       

## 2012-01-22 NOTE — Anesthesia Preprocedure Evaluation (Signed)

## 2012-01-22 NOTE — Brief Op Note (Signed)
01/22/2012  11:03 AM  PATIENT:  Patty Valdez  26 y.o. female  PRE-OPERATIVE DIAGNOSIS:  Cholecystitis   POST-OPERATIVE DIAGNOSIS:  Cholecystitis  Patient Care Team: Jearld Lesch as PCP - General (Specialist)  PROCEDURE:  Procedure(s) (LRB): LAPAROSCOPIC CHOLECYSTECTOMY SINGLE PORT (N/A)  SURGEON:  Surgeon(s) and Role:    * Ardeth Sportsman, MD - Primary  PHYSICIAN ASSISTANT:   ASSISTANTS: none   ANESTHESIA:   local and general  EBL:  Total I/O In: 1000 [I.V.:1000] Out: -   BLOOD ADMINISTERED:none  DRAINS: none   LOCAL MEDICATIONS USED:  BUPIVICAINE  and Amount: 50 ml  SPECIMEN:  Source of Specimen:  Gallbladder  DISPOSITION OF SPECIMEN:  PATHOLOGY  COUNTS:  YES  TOURNIQUET:  * No tourniquets in log *  DICTATION: .Other Dictation: Dictation Number   The patient was identified & brought in the operating room. The patient was positioned supine with arms tucked. SCDs were active during the entire case. The patient underwent general anesthesia without any difficulty.  The abdomen was prepped and draped in a sterile fashion. A Surgical Timeout confirmed our plan.  I made a transverse curvilinear incision through the superior umbilical fold.  I placed a 5mm long port through the supraumbilical fascia using a modified Hassan cutdown technique. I began carbon dioxide insufflation. Camera inspection revealed no injury. There were no adhesions to the anterior abdominal wall supraumbilically.  I proceeded to continue with single site technique. I placed a #5 port in left upper aspect of the wound. I placed a 5 mm atraumatic grasper in the right inferior aspect of the wound.  I turned attention to the right upper quadrant.  The gallbladder was chronically thickened but no adhesions.  The gallbladder fundus was elevated cephalad. I freed the peritoneal coverings between the gallbladder and the liver on the posteriolateral and anteriomedial walls. I alternated between Harmonic  & blunt Maryland dissection to help get a good critical view of the cystic artery and cystic duct. I did further dissection to free a few centimeters of the  gallbladder off the liver bed to get a good critical view of the infundibulum and cystic duct. I mobilized the cystic artery; and, after getting a good 360 view, ligated the cystic artery using the Harmonic ultrasonic dissection. I skeletonized the cystic duct.  I placed a clip on the infundibulum. I did a partial cystic duct-otomy and ensured patency. I placed a 5 [place] cholangiocatheter through a puncture site at the right subcostal ridge of the abdominal wall.  As I was directing it into the cystic duct, the cystic duct fully separated off.  I therefore aborted cholangiography.  I removed the cholangiocatheter. I placed clips on the cystic duct stump x5.  I freed the gallbladder from its remaining attachments to the liver. I ensured hemostasis on the gallbladder fossa of the liver and elsewhere. I inspected the rest of the abdomen & detected no injury nor bleeding elsewhere.  I removed the gallbladder out the supraumbilical fascia. I closed the fascia transversely using 0 Vicryl interrupted stitches. A closed the skin using 4-0 monocryl stitch.  Sterile dressing was applied. The patient was extubated & arrived in the PACU in stable condition..  I had discussed postoperative care with the patient in the holding area. I am about to locate the patient's family and discuss operative findings and postoperative goals / instructions.  Instructions are written in the chart as well.  PLAN OF CARE: Discharge to home after PACU  PATIENT  DISPOSITION:  PACU - hemodynamically stable.   Delay start of Pharmacological VTE agent (>24hrs) due to surgical blood loss or risk of bleeding: no

## 2012-01-22 NOTE — Interval H&P Note (Signed)
History and Physical Interval Note:  01/22/2012 9:09 AM  Patty Valdez  has presented today for surgery, with the diagnosis of Cholecystitis  The various methods of treatment have been discussed with the patient and family. After consideration of risks, benefits and other options for treatment, the patient has consented to  Procedure(s) (LRB): LAPAROSCOPIC CHOLECYSTECTOMY SINGLE PORT (N/A) INTRAOPERATIVE CHOLANGIOGRAM (N/A) as a surgical intervention .  The patients' history has been reviewed, patient examined, no change in status, stable for surgery.  I have reviewed the patients' chart and labs.  Questions were answered to the patient's satisfaction.     Cherise Fedder C.

## 2012-01-22 NOTE — Progress Notes (Signed)
Pt is having complaints of right sided pain. Pt  Does fall asleep btwn complaints of pain.  Pt is instructed to try and drink fluids and nibble on crackers and she can then have a pain pill. Sister and friend are at bedside.

## 2012-01-22 NOTE — Progress Notes (Signed)
Pt up again ambulation to bathroom. Pt voided large amount clear yellow urine without difficulty.  Pt encouraged to get dressed.

## 2012-01-22 NOTE — Transfer of Care (Signed)
Immediate Anesthesia Transfer of Care Note  [name]  Procedure(s) Performed: Procedure(s) (LRB): LAPAROSCOPIC CHOLECYSTECTOMY SINGLE PORT (N/A)  Patient Location: PACU  Anesthesia Type: General  Level of Consciousness: sedated  Airway & Oxygen Therapy: Patient Spontanous Breathing and Patient connected to face mask oxygen  Post-op Assessment: Report given to PACU RN and Post -op Vital signs reviewed and stable  Post vital signs: Reviewed and stable  Complications: No apparent anesthesia complications

## 2012-01-22 NOTE — Anesthesia Procedure Notes (Signed)
Procedure Name: Intubation Date/Time: 01/22/2012 9:39 AM Performed by: Joycie Peek Pre-anesthesia Checklist: Patient identified, Emergency Drugs available, Timeout performed, Suction available and Patient being monitored Patient Re-evaluated:Patient Re-evaluated prior to inductionOxygen Delivery Method: Circle system utilized Preoxygenation: Pre-oxygenation with 100% oxygen Intubation Type: IV induction Laryngoscope Size: Mac and 3 Grade View: Grade I Tube type: Oral Number of attempts: 1 Airway Equipment and Method: Stylet Placement Confirmation: breath sounds checked- equal and bilateral,  ETT inserted through vocal cords under direct vision and positive ETCO2 Secured at: 21 cm Tube secured with: Tape Dental Injury: Teeth and Oropharynx as per pre-operative assessment

## 2012-01-22 NOTE — Progress Notes (Signed)
Pt sitting up on bedside tolerating well.  Pt ambulated up and down hallway in short stay 3-4 times tolerating well.  She belched numerous time and reports is starting to feel some better.

## 2012-01-22 NOTE — Progress Notes (Signed)
Pt's pain appears to be gas pains. Pt is encouraged to get up and walk.

## 2012-01-22 NOTE — Anesthesia Postprocedure Evaluation (Signed)
  Anesthesia Post-op Note  [name]  Procedure(s) Performed: Procedure(s) (LRB): LAPAROSCOPIC CHOLECYSTECTOMY SINGLE PORT (N/A)  Patient Location: PACU  Anesthesia Type: General  Level of Consciousness: awake and alert   Airway and Oxygen Therapy: Patient Spontanous Breathing  Post-op Pain: mild  Post-op Assessment: Post-op Vital signs reviewed, Patient's Cardiovascular Status Stable, Respiratory Function Stable, Patent Airway and No signs of Nausea or vomiting  Post-op Vital Signs: stable  Complications: No apparent anesthesia complications

## 2012-01-24 ENCOUNTER — Telehealth (INDEPENDENT_AMBULATORY_CARE_PROVIDER_SITE_OTHER): Payer: Self-pay

## 2012-01-24 DIAGNOSIS — Z9049 Acquired absence of other specified parts of digestive tract: Secondary | ICD-10-CM

## 2012-01-24 MED ORDER — TRAMADOL HCL 50 MG PO TABS: 50.0000 mg | ORAL_TABLET | Freq: Two times a day (BID) | ORAL | Status: DC | PRN

## 2012-01-24 MED ORDER — LORAZEPAM 0.5 MG PO TABS: 0.5000 mg | ORAL_TABLET | Freq: Three times a day (TID) | ORAL | Status: AC

## 2012-01-24 NOTE — Telephone Encounter (Signed)
Called pt to let her know that I did discuss with Dr Michaell Cowing about the pt taking the Oxycodone 5mg  stating in the message that it wasn't working for the pain. I advised pt that Dr Michaell Cowing wants her using NSAID's with heat around the clock along with her pain med.

## 2012-01-24 NOTE — Telephone Encounter (Signed)
This message is continued from the call this am with the pt b/c after I was giving her the advice from Dr Michaell Cowing the pt stated she didn't want a different pain med. She was requesting more of the Oxycodone 5mg  b/c she has been taking more than the directions. I advised pt that we would call in a Rx for Ativan 0.5mg  #30 along with a refill. I asked Dr Michaell Cowing about the refill and he said no way on the refill for Oxycondone b/c he had just given her a script yesterday for #50 so if she is needing a refill then we need to switch her to something else b/c she will overdose on taking too many. I advised the pt that Dr Michaell Cowing wanted her to switch to Tramadol 50mg  #40 take 2tabs q 6hrs prn pain along with the Ativan,NSAID's,and heat.  The pt called me back to ask why Dr Michaell Cowing was prescribing her less pain medicine b/c she was having so much pain. I advised the pt that the Oxycodone is not working so he wants to try something else that's why he prescribed Tramadol along with Ativan,NSAID's and heat witht this combination the pt should feel better.  I did call [facility] on [address] to call Ativan and Tramadol Rx in for the pt.

## 2012-01-29 ENCOUNTER — Telehealth (INDEPENDENT_AMBULATORY_CARE_PROVIDER_SITE_OTHER): Payer: Self-pay

## 2012-01-29 ENCOUNTER — Telehealth (INDEPENDENT_AMBULATORY_CARE_PROVIDER_SITE_OTHER): Payer: Self-pay | Admitting: General Surgery

## 2012-01-29 DIAGNOSIS — Z9049 Acquired absence of other specified parts of digestive tract: Secondary | ICD-10-CM

## 2012-01-29 MED ORDER — HYDROCODONE-ACETAMINOPHEN 5-325 MG PO TABS: 1.0000 | ORAL_TABLET | Freq: Four times a day (QID) | ORAL | Status: AC | PRN

## 2012-01-29 NOTE — Telephone Encounter (Signed)
LMOM for pt to call me back.

## 2012-01-29 NOTE — Telephone Encounter (Signed)
Pt returned my call and I advised per DR Gross we could call in Hydrocodone 5/325mg  #30 to [facility]. The pt agrees.

## 2012-01-29 NOTE — Telephone Encounter (Signed)
PT CALLED TO SAY TRAMADOL IS NOT HELPING HER UMBILICAL PAIN. NO FEVER/ BOWELS ARE MOVING/ NO DRAINAGE FROM UMBILICUS. 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 SHE IS CALLING FOR PAIN MEDICATION/ NO ALLERGIES/ PHARMACY IS [facility]/ [phone]/ [place]

## 2012-02-11 ENCOUNTER — Encounter (INDEPENDENT_AMBULATORY_CARE_PROVIDER_SITE_OTHER): Payer: Medicaid Other | Admitting: Surgery

## 2012-02-19 ENCOUNTER — Encounter (INDEPENDENT_AMBULATORY_CARE_PROVIDER_SITE_OTHER): Payer: Self-pay | Admitting: Surgery

## 2012-02-20 ENCOUNTER — Encounter (INDEPENDENT_AMBULATORY_CARE_PROVIDER_SITE_OTHER): Payer: Medicaid Other | Admitting: Surgery

## 2012-12-28 ENCOUNTER — Encounter (HOSPITAL_COMMUNITY): Payer: Self-pay | Admitting: Emergency Medicine

## 2012-12-28 ENCOUNTER — Emergency Department (HOSPITAL_COMMUNITY)
Admission: EM | Admit: 2012-12-28 | Discharge: 2012-12-28 | Disposition: A | Discharge status: Home / Self Care | Payer: Medicaid Other | Attending: Emergency Medicine | Admitting: Emergency Medicine

## 2012-12-28 DIAGNOSIS — Z8619 Personal history of other infectious and parasitic diseases: Secondary | ICD-10-CM | POA: Insufficient documentation

## 2012-12-28 DIAGNOSIS — Z862 Personal history of diseases of the blood and blood-forming organs and certain disorders involving the immune mechanism: Secondary | ICD-10-CM | POA: Insufficient documentation

## 2012-12-28 DIAGNOSIS — Z9089 Acquired absence of other organs: Secondary | ICD-10-CM | POA: Insufficient documentation

## 2012-12-28 DIAGNOSIS — Z8742 Personal history of other diseases of the female genital tract: Secondary | ICD-10-CM | POA: Insufficient documentation

## 2012-12-28 DIAGNOSIS — R1013 Epigastric pain: Secondary | ICD-10-CM | POA: Insufficient documentation

## 2012-12-28 DIAGNOSIS — Z8719 Personal history of other diseases of the digestive system: Secondary | ICD-10-CM | POA: Insufficient documentation

## 2012-12-28 DIAGNOSIS — Z79899 Other long term (current) drug therapy: Secondary | ICD-10-CM | POA: Insufficient documentation

## 2012-12-28 DIAGNOSIS — Z3202 Encounter for pregnancy test, result negative: Secondary | ICD-10-CM | POA: Insufficient documentation

## 2012-12-28 DIAGNOSIS — F411 Generalized anxiety disorder: Secondary | ICD-10-CM | POA: Insufficient documentation

## 2012-12-28 DIAGNOSIS — F17200 Nicotine dependence, unspecified, uncomplicated: Secondary | ICD-10-CM | POA: Insufficient documentation

## 2012-12-28 LAB — URINE MICROSCOPIC-ADD ON

## 2012-12-28 LAB — URINALYSIS, ROUTINE W REFLEX MICROSCOPIC
Ketones, ur: 15 mg/dL — AB
Leukocytes, UA: NEGATIVE
Nitrite: NEGATIVE
Protein, ur: NEGATIVE mg/dL
Urobilinogen, UA: 0.2 mg/dL (ref 0.0–1.0)

## 2012-12-28 LAB — CBC WITH DIFFERENTIAL/PLATELET
Basophils Absolute: 0 10*3/uL (ref 0.0–0.1)
Eosinophils Relative: 2 % (ref 0–5)
HCT: 39.6 % (ref 36.0–46.0)
Hemoglobin: 13.6 g/dL (ref 12.0–15.0)
Lymphocytes Relative: 25 % (ref 12–46)
MCHC: 34.3 g/dL (ref 30.0–36.0)
MCV: 90.2 fL (ref 78.0–100.0)
Monocytes Absolute: 0.7 10*3/uL (ref 0.1–1.0)
Monocytes Relative: 13 % — ABNORMAL HIGH (ref 3–12)
RDW: 12.6 % (ref 11.5–15.5)
WBC: 5.1 10*3/uL (ref 4.0–10.5)

## 2012-12-28 LAB — LIPASE, BLOOD: Lipase: 16 U/L (ref 11–59)

## 2012-12-28 LAB — COMPREHENSIVE METABOLIC PANEL
AST: 36 U/L (ref 0–37)
BUN: 9 mg/dL (ref 6–23)
CO2: 27 mEq/L (ref 19–32)
Calcium: 9.5 mg/dL (ref 8.4–10.5)
Chloride: 104 mEq/L (ref 96–112)
Creatinine, Ser: 0.54 mg/dL (ref 0.50–1.10)
GFR calc Af Amer: >90 mL/min (ref >90)
GFR calc non Af Amer: >90 mL/min (ref >90)
Total Bilirubin: 0.2 mg/dL — ABNORMAL LOW (ref 0.3–1.2)

## 2012-12-28 LAB — POCT PREGNANCY, URINE: Preg Test, Ur: NEGATIVE

## 2012-12-28 MED ORDER — ONDANSETRON 8 MG PO TBDP: 8.0000 mg | ORAL_TABLET | Freq: Three times a day (TID) | ORAL | Status: DC | PRN

## 2012-12-28 MED ORDER — HYOSCYAMINE SULFATE 0.125 MG PO TABS: 0.1250 mg | ORAL_TABLET | ORAL | Status: DC | PRN

## 2012-12-28 MED ORDER — HYDROCODONE-ACETAMINOPHEN 5-325 MG PO TABS: ORAL_TABLET | ORAL | Status: DC

## 2012-12-28 NOTE — ED Notes (Signed)
Pt c/o upper abd pain x 6 months intermittently; pt denies N/V

## 2012-12-28 NOTE — ED Notes (Signed)
Pt discharged to home with mother. NAD 

## 2012-12-28 NOTE — ED Provider Notes (Signed)
History     CSN: [id number]  Arrival date & time 12/28/12  1103   First MD Initiated Contact with Patient 12/28/12 1200      Chief Complaint  Patient presents with  . Abdominal Pain    (Consider location/radiation/quality/duration/timing/severity/associated sxs/prior treatment) HPI Comments: Patient with history of cholecystectomy presents with complaint of intermittent upper abdominal pain for the past several months. Patient states that she will get squeezing pain in the middle part of her upper abdomen that lasts approximately 30 minutes on average. Pain does not radiate to the back or shoulders. It is not associated with nausea, vomiting, or diarrhea. It does not seem to be associated with food. Patient does not drink alcohol and does not use NSAIDs. No change in bowel movements or urinary symptoms. No vaginal bleeding or discharge. Patient states that her physician was to do a stomach that is before she went to jail. She has not followed up since then. Patient has been taking omeprazole and ranitidine without relief. Onset of symptoms gradual. Course is intermittent. Nothing makes symptoms better or worse.  The history is provided by the patient.    Past Medical History  Diagnosis Date  . Gallstones   . Nausea   . Abdominal pain   . Vaginal bleeding   . Anemia 10 yrs ago  . Anxiety   . Genital herpes     no current meds for  . Complication of anesthesia 04-08-2009    low blood pressure after epidural, had to take iv meds for    Past Surgical History  Procedure Laterality Date  . No past surgeries      Family History  Problem Relation Age of Onset  . Cancer Maternal Grandfather     colon    History  Substance Use Topics  . Smoking status: Current Every Day Smoker -- 0.50 packs/day for 10 years    Types: Cigarettes  . Smokeless tobacco: Never Used  . Alcohol Use: Yes     Comment: 6 pack per week    OB History   Grav Para Term Preterm Abortions TAB SAB Ect Mult  Living                  Review of Systems  Constitutional: Negative for fever.  HENT: Negative for sore throat and rhinorrhea.   Eyes: Negative for redness.  Respiratory: Negative for cough.   Cardiovascular: Negative for chest pain.  Gastrointestinal: Positive for abdominal pain. Negative for nausea, vomiting and diarrhea.  Genitourinary: Negative for dysuria.  Musculoskeletal: Negative for myalgias.  Skin: Negative for rash.  Neurological: Negative for headaches.    Allergies  Review of patient's allergies indicates no known allergies.  Home Medications   Current Outpatient Rx  Name  Route  Sig  Dispense  Refill  . clonazePAM (KLONOPIN) 1 MG tablet   Oral   Take 1 mg by mouth 2 (two) times daily as needed. For anxiety         . EXPIRED: pantoprazole (PROTONIX) 20 MG tablet   Oral   Take 20 mg by mouth daily as needed. Heart burn          . traMADol (ULTRAM) 50 MG tablet   Oral   Take 1 tablet (50 mg total) by mouth 2 (two) times daily as needed for pain. Pt to take 2tabs q 6hrs prn pain   40 tablet   0     BP 123/81  Pulse 69  Temp(Src) 97.8 F (36.6  C) (Oral)  Resp 18  SpO2 99%  Physical Exam  Nursing note and vitals reviewed. Constitutional: She appears well-developed and well-nourished.  HENT:  Head: Normocephalic and atraumatic.  Eyes: Conjunctivae are normal. Right eye exhibits no discharge. Left eye exhibits no discharge.  Neck: Normal range of motion. Neck supple.  Cardiovascular: Normal rate, regular rhythm and normal heart sounds.   Pulmonary/Chest: Effort normal and breath sounds normal.  Abdominal: Soft. Bowel sounds are normal. She exhibits no distension. There is tenderness (mild, in area noted). There is no rebound, no guarding, no tenderness at McBurney's point and negative Murphy's sign.    Neurological: She is alert.  Skin: Skin is warm and dry.  Psychiatric: She has a normal mood and affect.    ED Course  Procedures (including  critical care time)  Labs Reviewed  CBC WITH DIFFERENTIAL - Abnormal; Notable for the following:    Monocytes Relative 13 (*)    All other components within normal limits  COMPREHENSIVE METABOLIC PANEL - Abnormal; Notable for the following:    Total Protein 8.5 (*)    ALT 59 (*)    Alkaline Phosphatase 125 (*)    Total Bilirubin 0.2 (*)    All other components within normal limits  URINALYSIS, ROUTINE W REFLEX MICROSCOPIC - Abnormal; Notable for the following:    Color, Urine AMBER (*)    APPearance CLOUDY (*)    Specific Gravity, Urine 1.031 (*)    Hgb urine dipstick LARGE (*)    Bilirubin Urine SMALL (*)    Ketones, ur 15 (*)    All other components within normal limits  LIPASE, BLOOD  URINE MICROSCOPIC-ADD ON  POCT PREGNANCY, URINE   No results found.   1. Epigastric pain     12:08 PM Pt not yet in room.    Vital signs reviewed and are as follows: Filed Vitals:   12/28/12 1127  BP: 123/81  Pulse: 69  Temp: 97.8 F (36.6 C)  Resp: 18   12:31 PM Patient seen and examined. Blood tests are pending.  Results reviewed. Findings discussed with Dr. Manus Gunning. Patient informed, advised that she needs primary care and GI followup.  Will treat symptomatically in the interim.  The patient was urged to return to the Emergency Department immediately with worsening of current symptoms, worsening abdominal pain, persistent vomiting, blood noted in stools, fever, or any other concerns. The patient verbalized understanding.   Patient counseled on use of narcotic pain medications. Counseled not to combine these medications with others containing tylenol. Urged not to drink alcohol, drive, or perform any other activities that requires focus while taking these medications. The patient verbalizes understanding and agrees with the plan.  MDM  Patient with epigastric pain. History of cholecystectomy. Labs today are unremarkable. Suspect she will need upper endoscopy and close GI  followup. Do not suspect appendicitis or hepatitis. Do not suspect colitis. Will treat symptomatically at this time. Strict return instructions given.        [place], [place] 12/28/12 [phone]

## 2012-12-28 NOTE — ED Provider Notes (Signed)
Medical screening examination/treatment/procedure(s) were performed by non-physician practitioner and as supervising physician I was immediately available for consultation/collaboration.   Glynn Octave, MD 12/28/12 [phone]

## 2012-12-28 NOTE — ED Notes (Signed)
Pt states she feels "much better" 

## 2012-12-29 ENCOUNTER — Telehealth (HOSPITAL_COMMUNITY): Payer: Self-pay | Admitting: Emergency Medicine

## 2013-07-16 ENCOUNTER — Emergency Department (HOSPITAL_COMMUNITY)
Admission: EM | Admit: 2013-07-16 | Discharge: 2013-07-16 | Discharge status: Against Medical Advice | Payer: Medicaid Other | Attending: Emergency Medicine | Admitting: Emergency Medicine

## 2013-07-16 ENCOUNTER — Encounter (HOSPITAL_COMMUNITY): Payer: Self-pay | Admitting: *Deleted

## 2013-07-16 DIAGNOSIS — E669 Obesity, unspecified: Secondary | ICD-10-CM | POA: Insufficient documentation

## 2013-07-16 DIAGNOSIS — R110 Nausea: Secondary | ICD-10-CM | POA: Insufficient documentation

## 2013-07-16 DIAGNOSIS — G43909 Migraine, unspecified, not intractable, without status migrainosus: Secondary | ICD-10-CM | POA: Insufficient documentation

## 2013-07-16 DIAGNOSIS — J45909 Unspecified asthma, uncomplicated: Secondary | ICD-10-CM | POA: Insufficient documentation

## 2013-07-16 DIAGNOSIS — F17200 Nicotine dependence, unspecified, uncomplicated: Secondary | ICD-10-CM | POA: Insufficient documentation

## 2013-07-16 HISTORY — DX: Migraine, unspecified, not intractable, without status migrainosus: G43.909

## 2013-07-16 HISTORY — DX: Obesity, unspecified: E66.9

## 2013-07-16 MED ORDER — METOCLOPRAMIDE HCL 5 MG/ML IJ SOLN: 10.0000 mg | Freq: Once | INTRAMUSCULAR | Status: DC

## 2013-07-16 MED ORDER — KETOROLAC TROMETHAMINE 10 MG PO TABS
10.0000 mg | ORAL_TABLET | Freq: Once | ORAL | Status: DC
Start: 2013-07-16 — End: 2013-07-16

## 2013-07-16 MED ORDER — DIPHENHYDRAMINE HCL 50 MG/ML IJ SOLN: 25.0000 mg | Freq: Once | INTRAMUSCULAR | Status: DC

## 2013-07-16 NOTE — ED Notes (Signed)
Pt reports hx of migraines. Having severe headache since yesterday, having nausea and light sensitivity, denies vomiting. No relief with tylenol.

## 2013-09-13 ENCOUNTER — Emergency Department (HOSPITAL_COMMUNITY)
Admission: EM | Admit: 2013-09-13 | Discharge: 2013-09-13 | Disposition: A | Discharge status: Home / Self Care | Payer: Medicaid Other | Attending: Emergency Medicine | Admitting: Emergency Medicine

## 2013-09-13 ENCOUNTER — Encounter (HOSPITAL_COMMUNITY): Payer: Self-pay | Admitting: Emergency Medicine

## 2013-09-13 DIAGNOSIS — Z8679 Personal history of other diseases of the circulatory system: Secondary | ICD-10-CM | POA: Insufficient documentation

## 2013-09-13 DIAGNOSIS — Y9289 Other specified places as the place of occurrence of the external cause: Secondary | ICD-10-CM | POA: Insufficient documentation

## 2013-09-13 DIAGNOSIS — F419 Anxiety disorder, unspecified: Secondary | ICD-10-CM

## 2013-09-13 DIAGNOSIS — Z8719 Personal history of other diseases of the digestive system: Secondary | ICD-10-CM | POA: Insufficient documentation

## 2013-09-13 DIAGNOSIS — W1809XA Striking against other object with subsequent fall, initial encounter: Secondary | ICD-10-CM | POA: Insufficient documentation

## 2013-09-13 DIAGNOSIS — R404 Transient alteration of awareness: Secondary | ICD-10-CM | POA: Insufficient documentation

## 2013-09-13 DIAGNOSIS — Z8619 Personal history of other infectious and parasitic diseases: Secondary | ICD-10-CM | POA: Insufficient documentation

## 2013-09-13 DIAGNOSIS — S0990XA Unspecified injury of head, initial encounter: Secondary | ICD-10-CM

## 2013-09-13 DIAGNOSIS — R079 Chest pain, unspecified: Secondary | ICD-10-CM | POA: Insufficient documentation

## 2013-09-13 DIAGNOSIS — W19XXXA Unspecified fall, initial encounter: Secondary | ICD-10-CM

## 2013-09-13 DIAGNOSIS — R4020 Unspecified coma: Secondary | ICD-10-CM

## 2013-09-13 DIAGNOSIS — Z8742 Personal history of other diseases of the female genital tract: Secondary | ICD-10-CM | POA: Insufficient documentation

## 2013-09-13 DIAGNOSIS — IMO0002 Reserved for concepts with insufficient information to code with codable children: Secondary | ICD-10-CM | POA: Insufficient documentation

## 2013-09-13 DIAGNOSIS — F411 Generalized anxiety disorder: Secondary | ICD-10-CM | POA: Insufficient documentation

## 2013-09-13 DIAGNOSIS — J45909 Unspecified asthma, uncomplicated: Secondary | ICD-10-CM | POA: Insufficient documentation

## 2013-09-13 DIAGNOSIS — Z8669 Personal history of other diseases of the nervous system and sense organs: Secondary | ICD-10-CM | POA: Insufficient documentation

## 2013-09-13 DIAGNOSIS — Z3202 Encounter for pregnancy test, result negative: Secondary | ICD-10-CM | POA: Insufficient documentation

## 2013-09-13 DIAGNOSIS — Z862 Personal history of diseases of the blood and blood-forming organs and certain disorders involving the immune mechanism: Secondary | ICD-10-CM | POA: Insufficient documentation

## 2013-09-13 DIAGNOSIS — F17200 Nicotine dependence, unspecified, uncomplicated: Secondary | ICD-10-CM | POA: Insufficient documentation

## 2013-09-13 DIAGNOSIS — Y9389 Activity, other specified: Secondary | ICD-10-CM | POA: Insufficient documentation

## 2013-09-13 DIAGNOSIS — E669 Obesity, unspecified: Secondary | ICD-10-CM | POA: Insufficient documentation

## 2013-09-13 LAB — URINALYSIS, ROUTINE W REFLEX MICROSCOPIC
Bilirubin Urine: NEGATIVE
Glucose, UA: NEGATIVE mg/dL
Ketones, ur: NEGATIVE mg/dL
Nitrite: NEGATIVE
Protein, ur: NEGATIVE mg/dL
Specific Gravity, Urine: 1.014 (ref 1.005–1.030)
Urobilinogen, UA: 0.2 mg/dL (ref 0.0–1.0)
pH: 5.5 (ref 5.0–8.0)

## 2013-09-13 LAB — URINE MICROSCOPIC-ADD ON

## 2013-09-13 LAB — CBC
HCT: 38.1 % (ref 36.0–46.0)
Hemoglobin: 13.2 g/dL (ref 12.0–15.0)
MCH: 31.3 pg (ref 26.0–34.0)
MCHC: 34.6 g/dL (ref 30.0–36.0)
MCV: 90.3 fL (ref 78.0–100.0)
Platelets: 273 K/uL (ref 150–400)
RBC: 4.22 MIL/uL (ref 3.87–5.11)
RDW: 12.9 % (ref 11.5–15.5)
WBC: 9.7 K/uL (ref 4.0–10.5)

## 2013-09-13 LAB — COMPREHENSIVE METABOLIC PANEL
ALT: 184 U/L — ABNORMAL HIGH (ref 0–35)
AST: 147 U/L — ABNORMAL HIGH (ref 0–37)
Albumin: 3.6 g/dL (ref 3.5–5.2)
Alkaline Phosphatase: 161 U/L — ABNORMAL HIGH (ref 39–117)
CO2: 26 mEq/L (ref 19–32)
Chloride: 96 mEq/L (ref 96–112)
Creatinine, Ser: 0.73 mg/dL (ref 0.50–1.10)
GFR calc non Af Amer: >90 mL/min (ref >90)
Potassium: 3.3 mEq/L — ABNORMAL LOW (ref 3.5–5.1)
Sodium: 133 mEq/L — ABNORMAL LOW (ref 135–145)
Total Bilirubin: 0.4 mg/dL (ref 0.3–1.2)

## 2013-09-13 LAB — COMPREHENSIVE METABOLIC PANEL WITH GFR
BUN: 4 mg/dL — ABNORMAL LOW (ref 6–23)
Calcium: 9 mg/dL (ref 8.4–10.5)
GFR calc Af Amer: >90 mL/min (ref >90)
Glucose, Bld: 100 mg/dL — ABNORMAL HIGH (ref 70–99)
Total Protein: 7.7 g/dL (ref 6.0–8.3)

## 2013-09-13 LAB — GLUCOSE, CAPILLARY: Glucose-Capillary: 113 mg/dL — ABNORMAL HIGH (ref 70–99)

## 2013-09-13 LAB — POCT PREGNANCY, URINE: Preg Test, Ur: NEGATIVE

## 2013-09-13 MED ORDER — LORAZEPAM 1 MG PO TABS
1.0000 mg | ORAL_TABLET | Freq: Once | ORAL | Status: AC
  Administered 2013-09-13: 1 mg via ORAL
  Filled 2013-09-13: qty 1

## 2013-09-13 NOTE — ED Notes (Signed)
Pt had witnessed syncopal episode lasting 2 minutes with 3 minutes altered after becoming alert. Pt does not complain of pain, and no deformities noted. EMS VS 160/110, 120 HR, 18 RR, 129 CBG, 98% RA

## 2013-09-13 NOTE — ED Notes (Signed)
Ghim MD at bedside. 

## 2013-09-13 NOTE — ED Provider Notes (Signed)
I saw and evaluated the patient, reviewed the resident's note and I agree with the findings and plan.  EKG Interpretation   At time 20:28 shows SR at rate 95, non specific anterior t wave inversions, normal axis, no ST abn's, normal intervals.  Borderline ECG.         Pt with severe anxiety, h/o panic attacks, had syncope today. Has happened to her in the past, also there is a supposed h/o seizures on her past history.  Pt had some oral trauma, but no incontinence.  Pt has a fraternal twin with similar episodes and history.  Pt is back to baseline, no HA, CP, SOB, non specific ECG and no sig arrythmia on ECG.  Pt is back to baseline, I think anxiety played a component to this episode and pt is stable to be discharged to home.    Gavin Pound. Oletta Lamas, MD 09/17/13 2149

## 2013-09-13 NOTE — ED Provider Notes (Signed)
CSN: [id number]     Arrival date & time 09/13/13  1958 History   First MD Initiated Contact with Patient 09/13/13 2131     Chief Complaint  Patient presents with  . Loss of Consciousness   The history is provided by the patient and a relative. No language interpreter was used.   Patty Valdez is a 27 y.o. Hispanic female with past medical history of anxiety and seizures who comes emergency department today with loss of consciousness. She was outside with her children and briefly lost consciousness falling to the ground. She impacted her head on a concrete curb. She was unconscious for approximately a minute and then regained consciousness without any confusion. She did not have any tonic-clonic activity when she lost consciousness. She did not experience incontinence of bowel or bladder. Patty Valdez did not want to come to the emergency department however her family was concerned as a result they sent her to the emergency department by EMS. Since she has been in the hospital she has had severe anxiety. In addition to her anxiety she has had some chest substernal chest tightness, warmth throughout her whole body, numbness, strange taste, or numbness to her lips. She does have a headache however she denies lethargy, nausea, vomiting, weakness, or numbness. She rates the pain to her head a 2/10.    Past Medical History  Diagnosis Date  . Gallstones   . Nausea   . Abdominal pain   . Vaginal bleeding   . Anemia 10 yrs ago  . Anxiety   . Genital herpes     no current meds for  . Complication of anesthesia 04-08-2009    low blood pressure after epidural, had to take iv meds for  . Migraine   . Asthma   . Seizures   . Obesity    Past Surgical History  Procedure Laterality Date  . No past surgeries     Family History  Problem Relation Age of Onset  . Cancer Maternal Grandfather     colon   History  Substance Use Topics  . Smoking status: Current Every Day Smoker -- 0.50 packs/day for 10  years    Types: Cigarettes  . Smokeless tobacco: Never Used  . Alcohol Use: Yes     Comment: 6 pack per week   OB History   Grav Para Term Preterm Abortions TAB SAB Ect Mult Living                 Review of Systems  Constitutional: Negative for fever and chills.  Respiratory: Negative for cough and shortness of breath.   Gastrointestinal: Negative for nausea, vomiting, abdominal pain, diarrhea and constipation.  Genitourinary: Negative for dysuria, urgency and frequency.  Neurological: Positive for headaches. Negative for dizziness, speech difficulty, weakness and numbness.  All other systems reviewed and are negative.    Allergies  Review of patient's allergies indicates no known allergies.  Home Medications   Current Outpatient Rx  Name  Route  Sig  Dispense  Refill  . EXPIRED: pantoprazole (PROTONIX) 20 MG tablet   Oral   Take 20 mg by mouth daily as needed. Heart burn           BP 130/94  Pulse 90  Temp(Src) 98.6 F (37 C) (Oral)  Resp 20  SpO2 100% Physical Exam  Nursing note and vitals reviewed. Constitutional: She is oriented to person, place, and time. She appears well-developed and well-nourished. No distress.  HENT:  Head: Normocephalic and  atraumatic.  Abrasion to the right side of the tongue.    Eyes: Pupils are equal, round, and reactive to light. Right pupil is round and reactive. Left pupil is round and reactive. Pupils are equal.  Neck: Normal range of motion.  Cardiovascular: Normal rate, regular rhythm, normal heart sounds and intact distal pulses.   Pulmonary/Chest: Effort normal. No respiratory distress. She has no wheezes. She exhibits no tenderness.  Abdominal: Soft. Bowel sounds are normal. She exhibits no distension. There is no tenderness. There is no rebound and no guarding.  Neurological: She is alert and oriented to person, place, and time. She has normal strength. No cranial nerve deficit or sensory deficit. She exhibits normal muscle  tone. Coordination and gait normal.  Skin: Skin is warm and dry.    ED Course  Procedures (including critical care time) Labs Review Labs Reviewed  COMPREHENSIVE METABOLIC PANEL - Abnormal; Notable for the following:    Sodium 133 (*)    Potassium 3.3 (*)    Glucose, Bld 100 (*)    BUN 4 (*)    AST 147 (*)    ALT 184 (*)    Alkaline Phosphatase 161 (*)    All other components within normal limits  URINALYSIS, ROUTINE W REFLEX MICROSCOPIC - Abnormal; Notable for the following:    APPearance CLOUDY (*)    Hgb urine dipstick TRACE (*)    Leukocytes, UA MODERATE (*)    All other components within normal limits  GLUCOSE, CAPILLARY - Abnormal; Notable for the following:    Glucose-Capillary 113 (*)    All other components within normal limits  URINE MICROSCOPIC-ADD ON - Abnormal; Notable for the following:    Squamous Epithelial / LPF FEW (*)    Bacteria, UA FEW (*)    All other components within normal limits  CBC  POCT PREGNANCY, URINE   Imaging Review No results found.  EKG Interpretation   None       MDM   Patty Valdez is a 27 year old Hispanic female with past medical history of seizures and anxiety who comes emergency Department after loss of consciousness. Physical exam as above. Initial workup included a urine pregnancy, urinalysis, CBC, CMP, and glucose. Glucose 113. CMP had an elevated AST of 147, and ALT of 24, and alkaline phosphatase 161. Patty Valdez had no abdominal pain and a benign abdominal exam with no Murphy sign.  Doubt acute cholecystitis. With benign exam was felt to require further evaluation for the liver enzymes at this time. She is to followup with her primary care physician to continue to trend these. CBC was unremarkable. UA and few bacteria few squamous epithelial cells doubt urinary tract infection. The POCT pregnancy is negative. With no neurologic deficits and the incident occurring 2 hours ago Patty Valdez not felt to require CT of her head to evaluate  for intracranial abnormality.  Patty Valdez was felt to be suffering from an anxiety attack: Emergency department she is treated with a milligram of Ativan by mouth which completely relieved her symptoms. The skull these are stable for discharge. She was instructed to followup with her primary care physician in the morning for her anxiety. She was instructed to return to the emergency department if she develops weakness, numbness, vomiting, or lethargy. Patty Valdez expressed understanding.  She was discharged in stable condition. Labs review by myself and considered and medical decision-making. Care was discussed with my attending Dr. Oletta Lamas.  1. Head injury, initial encounter   2. Fall, initial  encounter   3. Loss of consciousness   4. Anxiety        Bethann Berkshire, MD 09/13/13 [phone]

## 2013-10-09 ENCOUNTER — Emergency Department (HOSPITAL_COMMUNITY)
Admission: EM | Admit: 2013-10-09 | Discharge: 2013-10-09 | Disposition: A | Discharge status: Home / Self Care | Payer: Medicaid Other | Attending: Emergency Medicine | Admitting: Emergency Medicine

## 2013-10-09 ENCOUNTER — Encounter (HOSPITAL_COMMUNITY): Payer: Self-pay | Admitting: Emergency Medicine

## 2013-10-09 DIAGNOSIS — F411 Generalized anxiety disorder: Secondary | ICD-10-CM | POA: Insufficient documentation

## 2013-10-09 DIAGNOSIS — F329 Major depressive disorder, single episode, unspecified: Secondary | ICD-10-CM

## 2013-10-09 DIAGNOSIS — F419 Anxiety disorder, unspecified: Secondary | ICD-10-CM

## 2013-10-09 DIAGNOSIS — F17200 Nicotine dependence, unspecified, uncomplicated: Secondary | ICD-10-CM | POA: Insufficient documentation

## 2013-10-09 DIAGNOSIS — F3289 Other specified depressive episodes: Secondary | ICD-10-CM | POA: Insufficient documentation

## 2013-10-09 MED ORDER — LORAZEPAM 1 MG PO TABS
1.0000 mg | ORAL_TABLET | Freq: Once | ORAL | Status: AC
  Administered 2013-10-09: 1 mg via ORAL
  Filled 2013-10-09: qty 1

## 2013-10-09 MED ORDER — LORAZEPAM 1 MG PO TABS: 1.0000 mg | ORAL_TABLET | Freq: Four times a day (QID) | ORAL | Status: DC | PRN

## 2013-10-09 NOTE — ED Notes (Signed)
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 Pt states that she has a lot going on in her life right now and she just cant handle it. Pt states that they changed her meds and not helping and trying to get her medicaid straightened out so she can get meds.  Pt states she has two kids and doesn't want to feel like this. Pt denies SI thoughts at this time.

## 2013-10-09 NOTE — ED Notes (Signed)
Pt states she feels much better. Family at bedside.

## 2013-10-09 NOTE — ED Provider Notes (Signed)
CSN: [id number]     Arrival date & time 10/09/13  1708 History  This chart was scribed for non-physician practitioner, Junious Silk, PA-C,working with Suzi Roots, MD, by Karle Plumber, ED Scribe.  This patient was seen in room WTR5/WTR5 and the patient's care was started at 6:25 PM.  Chief Complaint  Patient presents with  . Anxiety   The history is provided by the patient.   HPI Comments:  Patty Valdez is a 27 y.o. female who presents to the Emergency Department complaining of feeling like she is having a nervous breakdown and feels like she is "losing her mind" for approximately one month. Pt states she has a lot going on in her life right now and is having a hard time dealing with it. She states she has been in court to regain custody of her children. She reports family issues also. She reports feeling worthless and that her life does not matter. Pt states she was released from prison in February 2013 and has not been able to get her Klonopin and other anxiety medications since. Pt states she has been prescribed Buspar and it is not helping her. She states she stopped taking the Buspar about 2 months ago. Pt states she has presented to the ED before for an anxiety attack approximately one month ago where she was given Ativan with moderate relief. She expresses desire to get treatment because of her children. She is tearful and anxious during the visit. She denies suicidal and homicidal ideations. She reports drinking alcohol occasionally, 2 beers per week.  Past Medical History  Diagnosis Date  . Gallstones   . Nausea   . Abdominal pain   . Vaginal bleeding   . Anemia 10 yrs ago  . Anxiety   . Genital herpes     no current meds for  . Complication of anesthesia 04-08-2009    low blood pressure after epidural, had to take iv meds for  . Migraine   . Asthma   . Seizures   . Obesity    Past Surgical History  Procedure Laterality Date  . No past surgeries     Family  History  Problem Relation Age of Onset  . Cancer Maternal Grandfather     colon   History  Substance Use Topics  . Smoking status: Current Every Day Smoker -- 0.50 packs/day for 10 years    Types: Cigarettes  . Smokeless tobacco: Never Used  . Alcohol Use: Yes     Comment: 6 pack per week   OB History   Grav Para Term Preterm Abortions TAB SAB Ect Mult Living                 Review of Systems  Psychiatric/Behavioral: The patient is nervous/anxious.   All other systems reviewed and are negative.    Allergies  Review of patient's allergies indicates no known allergies.  Home Medications   Current Outpatient Rx  Name  Route  Sig  Dispense  Refill  . EXPIRED: pantoprazole (PROTONIX) 20 MG tablet   Oral   Take 20 mg by mouth daily as needed. Heart burn           Triage Vitals: BP 138/88  Pulse 90  Temp(Src) 98.7 F (37.1 C) (Oral)  Resp 20  SpO2 98% Physical Exam  Nursing note and vitals reviewed. Constitutional: She is oriented to person, place, and time. She appears well-developed and well-nourished. She appears distressed.  Tearful, anxious. Not suicidal or  homicidal.  HENT:  Head: Normocephalic and atraumatic.  Right Ear: External ear normal.  Left Ear: External ear normal.  Nose: Nose normal.  Mouth/Throat: Oropharynx is clear and moist.  Eyes: Conjunctivae are normal.  Neck: Normal range of motion.  Cardiovascular: Normal rate, regular rhythm and normal heart sounds.   Pulmonary/Chest: Effort normal and breath sounds normal. No stridor. No respiratory distress. She has no wheezes. She has no rales.  Abdominal: Soft. She exhibits no distension.  Musculoskeletal: Normal range of motion.  Neurological: She is alert and oriented to person, place, and time. She has normal strength.  Skin: Skin is warm and dry. She is not diaphoretic. No erythema.  Psychiatric: She has a normal mood and affect. Her behavior is normal.    ED Course  Procedures (including  critical care time) DIAGNOSTIC STUDIES: Oxygen Saturation is 98% on RA, normal by my interpretation.   COORDINATION OF CARE: 6:37 PM- Will give Ativan here in ED and research what resources are appropriate to help pt. Pt verbalizes understanding and agrees to plan.  Medications  LORazepam (ATIVAN) tablet 1 mg (not administered)   Labs Review Labs Reviewed - No data to display Imaging Review No results found.  EKG Interpretation   None       MDM   1. Anxiety   2. Depression    Patient presents to the emergency department complaining of symptoms consistent with anxiety.  Patient has a history of same with similar episodes.  The patient is resting comfortably, in no apparent distress and asymptomatic. Stress reducing mechanisms discussed and resource guide given.  Patient has been referred to psychiatric services for follow-up.  Discharged with a 3 day prescription for Ativan 1mg .  Patient contracts for safety at home.    I personally performed the services described in this documentation, which was scribed in my presence. The recorded information has been reviewed and is accurate.     Mora Bellman, PA-C 10/09/13 2026

## 2013-10-09 NOTE — ED Notes (Signed)
Pt states she will call for a ride home.

## 2013-10-10 NOTE — ED Provider Notes (Signed)
Medical screening examination/treatment/procedure(s) were performed by non-physician practitioner and as supervising physician I was immediately available for consultation/collaboration.  EKG Interpretation   None         Suzi Roots, MD 10/10/13 [phone]

## 2014-04-16 ENCOUNTER — Encounter: Payer: Self-pay | Admitting: Gastroenterology

## 2014-06-11 ENCOUNTER — Inpatient Hospital Stay (HOSPITAL_COMMUNITY)
Admission: EM | Admit: 2014-06-11 | Discharge: 2014-06-14 | DRG: 918 | Disposition: A | Discharge status: Other Acute Inpatient Hospital | Payer: Medicaid Other | Attending: Family Medicine | Admitting: Family Medicine

## 2014-06-11 ENCOUNTER — Encounter (HOSPITAL_COMMUNITY): Payer: Self-pay | Admitting: Emergency Medicine

## 2014-06-11 DIAGNOSIS — R4189 Other symptoms and signs involving cognitive functions and awareness: Secondary | ICD-10-CM

## 2014-06-11 DIAGNOSIS — B009 Herpesviral infection, unspecified: Secondary | ICD-10-CM

## 2014-06-11 DIAGNOSIS — F332 Major depressive disorder, recurrent severe without psychotic features: Secondary | ICD-10-CM

## 2014-06-11 DIAGNOSIS — R404 Transient alteration of awareness: Secondary | ICD-10-CM

## 2014-06-11 DIAGNOSIS — F17200 Nicotine dependence, unspecified, uncomplicated: Secondary | ICD-10-CM | POA: Diagnosis present

## 2014-06-11 DIAGNOSIS — N926 Irregular menstruation, unspecified: Secondary | ICD-10-CM

## 2014-06-11 DIAGNOSIS — R5383 Other fatigue: Secondary | ICD-10-CM | POA: Diagnosis present

## 2014-06-11 DIAGNOSIS — R45851 Suicidal ideations: Secondary | ICD-10-CM | POA: Diagnosis not present

## 2014-06-11 DIAGNOSIS — T50992A Poisoning by other drugs, medicaments and biological substances, intentional self-harm, initial encounter: Secondary | ICD-10-CM | POA: Diagnosis present

## 2014-06-11 DIAGNOSIS — I959 Hypotension, unspecified: Secondary | ICD-10-CM | POA: Diagnosis present

## 2014-06-11 DIAGNOSIS — I952 Hypotension due to drugs: Secondary | ICD-10-CM

## 2014-06-11 DIAGNOSIS — I9589 Other hypotension: Secondary | ICD-10-CM

## 2014-06-11 DIAGNOSIS — T50904A Poisoning by unspecified drugs, medicaments and biological substances, undetermined, initial encounter: Secondary | ICD-10-CM

## 2014-06-11 DIAGNOSIS — T50902A Poisoning by unspecified drugs, medicaments and biological substances, intentional self-harm, initial encounter: Secondary | ICD-10-CM

## 2014-06-11 DIAGNOSIS — K8010 Calculus of gallbladder with chronic cholecystitis without obstruction: Secondary | ICD-10-CM

## 2014-06-11 DIAGNOSIS — G40909 Epilepsy, unspecified, not intractable, without status epilepticus: Secondary | ICD-10-CM | POA: Diagnosis present

## 2014-06-11 DIAGNOSIS — T426X1A Poisoning by other antiepileptic and sedative-hypnotic drugs, accidental (unintentional), initial encounter: Principal | ICD-10-CM | POA: Diagnosis present

## 2014-06-11 DIAGNOSIS — Z5987 Material hardship due to limited financial resources, not elsewhere classified: Secondary | ICD-10-CM

## 2014-06-11 DIAGNOSIS — J45909 Unspecified asthma, uncomplicated: Secondary | ICD-10-CM | POA: Diagnosis present

## 2014-06-11 DIAGNOSIS — R5381 Other malaise: Secondary | ICD-10-CM | POA: Diagnosis present

## 2014-06-11 DIAGNOSIS — Z598 Other problems related to housing and economic circumstances: Secondary | ICD-10-CM

## 2014-06-11 DIAGNOSIS — T421X1A Poisoning by iminostilbenes, accidental (unintentional), initial encounter: Secondary | ICD-10-CM

## 2014-06-11 DIAGNOSIS — T50901A Poisoning by unspecified drugs, medicaments and biological substances, accidental (unintentional), initial encounter: Secondary | ICD-10-CM

## 2014-06-11 DIAGNOSIS — F411 Generalized anxiety disorder: Secondary | ICD-10-CM

## 2014-06-11 DIAGNOSIS — E669 Obesity, unspecified: Secondary | ICD-10-CM

## 2014-06-11 DIAGNOSIS — T421X2A Poisoning by iminostilbenes, intentional self-harm, initial encounter: Secondary | ICD-10-CM

## 2014-06-11 LAB — COMPREHENSIVE METABOLIC PANEL
ALT: 118 U/L — ABNORMAL HIGH (ref 0–35)
ANION GAP: 12 (ref 5–15)
AST: 57 U/L — ABNORMAL HIGH (ref 0–37)
Albumin: 3.9 g/dL (ref 3.5–5.2)
Alkaline Phosphatase: 117 U/L (ref 39–117)
BUN: 11 mg/dL (ref 6–23)
CALCIUM: 9.5 mg/dL (ref 8.4–10.5)
CO2: 23 meq/L (ref 19–32)
CREATININE: 0.54 mg/dL (ref 0.50–1.10)
Chloride: 102 mEq/L (ref 96–112)
GFR calc Af Amer: >90 mL/min (ref >90)
GLUCOSE: 91 mg/dL (ref 70–99)
Potassium: 4.4 mEq/L (ref 3.7–5.3)
Sodium: 137 mEq/L (ref 137–147)
TOTAL PROTEIN: 7.9 g/dL (ref 6.0–8.3)
Total Bilirubin: <0.2 mg/dL — ABNORMAL LOW (ref 0.3–1.2)

## 2014-06-11 LAB — RAPID URINE DRUG SCREEN, HOSP PERFORMED
Amphetamines: NOT DETECTED
Barbiturates: NOT DETECTED
Benzodiazepines: NOT DETECTED
Cocaine: NOT DETECTED
Opiates: NOT DETECTED
Tetrahydrocannabinol: NOT DETECTED

## 2014-06-11 LAB — CBC WITH DIFFERENTIAL/PLATELET
Basophils Absolute: 0.1 10*3/uL (ref 0.0–0.1)
Basophils Relative: 1 % (ref 0–1)
Eosinophils Absolute: 0.2 10*3/uL (ref 0.0–0.7)
Eosinophils Relative: 2 % (ref 0–5)
HCT: 38.8 % (ref 36.0–46.0)
Hemoglobin: 13.1 g/dL (ref 12.0–15.0)
Lymphocytes Relative: 27 % (ref 12–46)
Lymphs Abs: 2.2 10*3/uL (ref 0.7–4.0)
MCH: 31.6 pg (ref 26.0–34.0)
MCHC: 33.8 g/dL (ref 30.0–36.0)
MCV: 93.5 fL (ref 78.0–100.0)
Monocytes Absolute: 0.7 10*3/uL (ref 0.1–1.0)
Monocytes Relative: 8 % (ref 3–12)
Neutro Abs: 4.9 10*3/uL (ref 1.7–7.7)
Neutrophils Relative %: 62 % (ref 43–77)
Platelets: 253 10*3/uL (ref 150–400)
RBC: 4.15 MIL/uL (ref 3.87–5.11)
RDW: 13.1 % (ref 11.5–15.5)
WBC: 7.9 10*3/uL (ref 4.0–10.5)

## 2014-06-11 LAB — CARBAMAZEPINE LEVEL, TOTAL: Carbamazepine Lvl: 16.9 ug/mL (ref 4.0–12.0)

## 2014-06-11 LAB — PREGNANCY, URINE: Preg Test, Ur: NEGATIVE

## 2014-06-11 LAB — SALICYLATE LEVEL: Salicylate Lvl: <2 mg/dL — ABNORMAL LOW (ref 2.8–20.0)

## 2014-06-11 LAB — MAGNESIUM: Magnesium: 2.1 mg/dL (ref 1.5–2.5)

## 2014-06-11 LAB — ETHANOL: Alcohol, Ethyl (B): <11 mg/dL (ref 0–11)

## 2014-06-11 LAB — ACETAMINOPHEN LEVEL: Acetaminophen (Tylenol), Serum: <15 ug/mL (ref 10–30)

## 2014-06-11 MED ORDER — SODIUM CHLORIDE 0.9 % IV SOLN
Freq: Once | INTRAVENOUS | Status: AC
  Administered 2014-06-11: 125 mL/h via INTRAVENOUS

## 2014-06-11 MED ORDER — ONDANSETRON HCL 4 MG PO TABS: 4.0000 mg | ORAL_TABLET | Freq: Four times a day (QID) | ORAL | Status: DC | PRN

## 2014-06-11 MED ORDER — SODIUM CHLORIDE 0.9 % IJ SOLN: 3.0000 mL | Freq: Two times a day (BID) | INTRAMUSCULAR | Status: DC

## 2014-06-11 MED ORDER — SODIUM CHLORIDE 0.9 % IV SOLN
INTRAVENOUS | Status: DC
  Administered 2014-06-11: 21:00:00 via INTRAVENOUS

## 2014-06-11 MED ORDER — ACETAMINOPHEN 325 MG PO TABS
650.0000 mg | ORAL_TABLET | Freq: Four times a day (QID) | ORAL | Status: DC | PRN
  Administered 2014-06-13 – 2014-06-14 (×3): 650 mg via ORAL
  Filled 2014-06-11 (×3): qty 2

## 2014-06-11 MED ORDER — ACTIDOSE WITH SORBITOL 50 GM/240ML PO LIQD
50.0000 g | Freq: Once | ORAL | Status: AC
  Administered 2014-06-11: 50 g via ORAL
  Filled 2014-06-11: qty 240

## 2014-06-11 MED ORDER — SODIUM CHLORIDE 0.9 % IV SOLN: Freq: Once | INTRAVENOUS | Status: AC

## 2014-06-11 MED ORDER — ACETAMINOPHEN 650 MG RE SUPP: 650.0000 mg | Freq: Four times a day (QID) | RECTAL | Status: DC | PRN

## 2014-06-11 MED ORDER — ONDANSETRON HCL 4 MG/2ML IJ SOLN: 4.0000 mg | Freq: Four times a day (QID) | INTRAMUSCULAR | Status: DC | PRN

## 2014-06-11 MED ORDER — ENOXAPARIN SODIUM 40 MG/0.4ML ~~LOC~~ SOLN
40.0000 mg | SUBCUTANEOUS | Status: DC
  Administered 2014-06-12 – 2014-06-14 (×3): 40 mg via SUBCUTANEOUS
  Filled 2014-06-11 (×5): qty 0.4

## 2014-06-11 NOTE — ED Notes (Signed)
Pt has pants,shirt, socks, and undergarments locked in locker 26

## 2014-06-11 NOTE — ED Notes (Signed)
Sitter at bedside.

## 2014-06-11 NOTE — ED Notes (Signed)
Lupita LeashDonna (Mother) [phone] and Montez MoritaDarien (husband) [phone]

## 2014-06-11 NOTE — ED Notes (Signed)
IV infusion started

## 2014-06-11 NOTE — ED Provider Notes (Signed)
CSN: [id number]     Arrival date & time 06/11/14  1653 History   First MD Initiated Contact with Patient 06/11/14 1656     Chief Complaint  Patient presents with  . Suicide Attempt     (Consider location/radiation/quality/duration/timing/severity/associated sxs/prior Treatment) Patient is a 28 y.o. female presenting with Overdose. The history is provided by the patient and the EMS personnel. No language interpreter was used.  Drug Overdose This is a new problem. The current episode started today. Pertinent negatives include no abdominal pain, chest pain, coughing, fever, myalgias, vomiting or weakness. Associated symptoms comments: Per EMS report from patient and husband, the patient took an overdose of approximately 40 Tegretol 45 minutes prior to arrival in the ED. The patient is tearful and reports that she felt her family would be better off without her in their lives. She denies any vomiting since the ingestion. It was her husband that called for EMS.No history of suicide attempt in the past. .    Past Medical History  Diagnosis Date  . Gallstones   . Nausea   . Abdominal pain   . Vaginal bleeding   . Anemia 10 yrs ago  . Anxiety   . Genital herpes     no current meds for  . Complication of anesthesia 04-08-2009    low blood pressure after epidural, had to take iv meds for  . Migraine   . Asthma   . Seizures   . Obesity    Past Surgical History  Procedure Laterality Date  . No past surgeries     Family History  Problem Relation Age of Onset  . Cancer Maternal Grandfather     colon   History  Substance Use Topics  . Smoking status: Current Every Day Smoker -- 0.50 packs/day for 10 years    Types: Cigarettes  . Smokeless tobacco: Never Used  . Alcohol Use: Yes     Comment: 6 pack per week   OB History   Grav Para Term Preterm Abortions TAB SAB Ect Mult Living                 Review of Systems  Constitutional: Negative for fever.  Respiratory: Negative for  cough and shortness of breath.   Cardiovascular: Negative for chest pain.  Gastrointestinal: Negative for vomiting and abdominal pain.  Musculoskeletal: Negative.  Negative for myalgias.  Skin: Negative.   Neurological: Negative for syncope and weakness.  Psychiatric/Behavioral: Positive for suicidal ideas and self-injury.      Allergies  Review of patient's allergies indicates no known allergies.  Home Medications   Prior to Admission medications   Medication Sig Start Date End Date Taking? Authorizing Provider  clonazePAM (KLONOPIN) 1 MG tablet Take 1 mg by mouth 3 (three) times daily as needed for anxiety.   Yes Historical Provider, MD  pantoprazole (PROTONIX) 20 MG tablet Take 20 mg by mouth daily as needed. Heart burn  12/13/11 12/12/12  Thomasene LotBrigitte Nguyen, PA-C   BP 124/73  Pulse 86  Resp 22  SpO2 100% Physical Exam  Constitutional: She is oriented to person, place, and time. She appears well-developed and well-nourished. No distress.  Eyes: Conjunctivae are normal.  Neck: Normal range of motion.  Cardiovascular: Normal rate.   No murmur heard. Pulmonary/Chest: Effort normal. She has no wheezes. She has no rales.  Abdominal: Soft. There is no tenderness. There is no rebound and no guarding.  Neurological: She is alert and oriented to person, place, and time.  Skin:  Skin is warm and dry.  Psychiatric: Her speech is normal. She exhibits a depressed mood. She expresses suicidal ideation.  Tearful. Continues to endorse suicidal ideation.    ED Course  Procedures (including critical care time) Labs Review Labs Reviewed  CBC WITH DIFFERENTIAL  URINE RAPID DRUG SCREEN (HOSP PERFORMED)  PREGNANCY, URINE  COMPREHENSIVE METABOLIC PANEL  ETHANOL  CARBAMAZEPINE LEVEL, TOTAL  SALICYLATE LEVEL  ACETAMINOPHEN LEVEL    Imaging Review No results found.   EKG Interpretation None      MDM   Final diagnoses:  None    1. Suicide attempt 2. Drug overdose  Poison  control contacted:  EKG to look for QRS or QT prolongation Advises overdose peak of 33-72 hours Possible affects: urinary retention; dystonia; tachycardia; rhabdomyolysis; renal failure  Will need medical admission to observe until overdose peak known.  Re-eval: VS stable. Tegretol level critically high at 16.9. Discussed with [facility], Dr. Gerri Lins, who accepts for admission.   Arnoldo Hooker, PA-C 06/11/14 1940

## 2014-06-11 NOTE — H&P (Signed)
Patty Valdez is an 28 y.o. female.   Chief Complaint: Tegretol overdose. HPI:  Pt is a 28 yr old woman who took 41 200 mg tegretol tablets with the intent of killing herself. She apparently almost immediately reported the overdose to her husband who called EMS.  She received charcoal in the [facility] ED within an hour of ingestion.  She is found to have a tegretol level of 16.  At the time that I am seeing her there is no family around and the patient is quite lethargic.  Poison control has recommended monitoring the patient for QRS changes and QT prolongation for a period from 33 - 72 hours.  Past Medical History  Diagnosis Date  . Gallstones   . Nausea   . Abdominal pain   . Vaginal bleeding   . Anemia 10 yrs ago  . Anxiety   . Genital herpes     no current meds for  . Complication of anesthesia 04-08-2009    low blood pressure after epidural, had to take iv meds for  . Migraine   . Asthma   . Seizures   . Obesity     Past Surgical History  Procedure Laterality Date  . No past surgeries      Family History  Problem Relation Age of Onset  . Cancer Maternal Grandfather     colon   Social History:  reports that she has been smoking Cigarettes.  She has a 5 pack-year smoking history. She has never used smokeless tobacco. She reports that she drinks alcohol. She reports that she uses illicit drugs (Marijuana).  Allergies: No Known Allergies   (Not in a hospital admission)  Results for orders placed during the hospital encounter of 06/11/14 (from the past 48 hour(s))  CBC WITH DIFFERENTIAL     Status: None   Collection Time    06/11/14  5:13 PM      Result Value Ref Range   WBC 7.9  4.0 - 10.5 K/uL   RBC 4.15  3.87 - 5.11 MIL/uL   Hemoglobin 13.1  12.0 - 15.0 g/dL   HCT 38.8  36.0 - 46.0 %   MCV 93.5  78.0 - 100.0 fL   MCH 31.6  26.0 - 34.0 pg   MCHC 33.8  30.0 - 36.0 g/dL   RDW 13.1  11.5 - 15.5 %   Platelets 253  150 - 400 K/uL   Neutrophils Relative % 62  43 - 77 %   Neutro Abs 4.9  1.7 - 7.7 K/uL   Lymphocytes Relative 27  12 - 46 %   Lymphs Abs 2.2  0.7 - 4.0 K/uL   Monocytes Relative 8  3 - 12 %   Monocytes Absolute 0.7  0.1 - 1.0 K/uL   Eosinophils Relative 2  0 - 5 %   Eosinophils Absolute 0.2  0.0 - 0.7 K/uL   Basophils Relative 1  0 - 1 %   Basophils Absolute 0.1  0.0 - 0.1 K/uL  COMPREHENSIVE METABOLIC PANEL     Status: Abnormal   Collection Time    06/11/14  5:13 PM      Result Value Ref Range   Sodium 137  137 - 147 mEq/L   Potassium 4.4  3.7 - 5.3 mEq/L   Chloride 102  96 - 112 mEq/L   CO2 23  19 - 32 mEq/L   Glucose, Bld 91  70 - 99 mg/dL   BUN 11  6 - 23  mg/dL   Creatinine, Ser 0.54  0.50 - 1.10 mg/dL   Calcium 9.5  8.4 - 10.5 mg/dL   Total Protein 7.9  6.0 - 8.3 g/dL   Albumin 3.9  3.5 - 5.2 g/dL   AST 57 (*) 0 - 37 U/L   ALT 118 (*) 0 - 35 U/L   Alkaline Phosphatase 117  39 - 117 U/L   Total Bilirubin <0.2 (*) 0.3 - 1.2 mg/dL   GFR calc non Af Amer >90  >90 mL/min   GFR calc Af Amer >90  >90 mL/min   Comment: (NOTE)     The eGFR has been calculated using the CKD EPI equation.     This calculation has not been validated in all clinical situations.     eGFR's persistently <90 mL/min signify possible Chronic Kidney     Disease.   Anion gap 12  5 - 15  ETHANOL     Status: None   Collection Time    06/11/14  5:13 PM      Result Value Ref Range   Alcohol, Ethyl (B) <11  0 - 11 mg/dL   Comment:            LOWEST DETECTABLE LIMIT FOR     SERUM ALCOHOL IS 11 mg/dL     FOR MEDICAL PURPOSES ONLY  CARBAMAZEPINE LEVEL, TOTAL     Status: Abnormal   Collection Time    06/11/14  5:13 PM      Result Value Ref Range   Carbamazepine Lvl 16.9 (*) 4.0 - 12.0 ug/mL   Comment: CRITICAL RESULT CALLED TO, READ BACK BY AND VERIFIED WITH:     Eligha Bridegroom 06/11/14 WBOND     Performed at [place] LEVEL     Status: Abnormal   Collection Time    06/11/14  5:13 PM      Result Value Ref Range   Salicylate Lvl  <7.0 (*) 2.8 - 20.0 mg/dL  ACETAMINOPHEN LEVEL     Status: None   Collection Time    06/11/14  5:13 PM      Result Value Ref Range   Acetaminophen (Tylenol), Serum <15.0  10 - 30 ug/mL   Comment:            THERAPEUTIC CONCENTRATIONS VARY     SIGNIFICANTLY. A RANGE OF 10-30     ug/mL MAY BE AN EFFECTIVE     CONCENTRATION FOR MANY PATIENTS.     HOWEVER, SOME ARE BEST TREATED     AT CONCENTRATIONS OUTSIDE THIS     RANGE.     ACETAMINOPHEN CONCENTRATIONS     >150 ug/mL AT 4 HOURS AFTER     INGESTION AND >50 ug/mL AT 12     HOURS AFTER INGESTION ARE     OFTEN ASSOCIATED WITH TOXIC     REACTIONS.  MAGNESIUM     Status: None   Collection Time    06/11/14  5:13 PM      Result Value Ref Range   Magnesium 2.1  1.5 - 2.5 mg/dL  URINE RAPID DRUG SCREEN (HOSP PERFORMED)     Status: None   Collection Time    06/11/14  5:27 PM      Result Value Ref Range   Opiates NONE DETECTED  NONE DETECTED   Cocaine NONE DETECTED  NONE DETECTED   Benzodiazepines NONE DETECTED  NONE DETECTED   Amphetamines NONE DETECTED  NONE DETECTED   Tetrahydrocannabinol NONE DETECTED  NONE DETECTED   Barbiturates NONE DETECTED  NONE DETECTED   Comment:            DRUG SCREEN FOR MEDICAL PURPOSES     ONLY.  IF CONFIRMATION IS NEEDED     FOR ANY PURPOSE, NOTIFY LAB     WITHIN 5 DAYS.                LOWEST DETECTABLE LIMITS     FOR URINE DRUG SCREEN     Drug Class       Cutoff (ng/mL)     Amphetamine      1000     Barbiturate      200     Benzodiazepine   431     Tricyclics       540     Opiates          300     Cocaine          300     THC              50  PREGNANCY, URINE     Status: None   Collection Time    06/11/14  5:27 PM      Result Value Ref Range   Preg Test, Ur NEGATIVE  NEGATIVE   Comment:            THE SENSITIVITY OF THIS     METHODOLOGY IS >20 mIU/mL.   No results found.  Review of Systems  Unable to perform ROS: medical condition    Blood pressure 102/54, pulse 93, resp. rate 21,  SpO2 95.00%. Physical Exam  Constitutional: She appears well-developed and well-nourished.  Pt is chemically obtunded as a result of her overdose of tegretol.  She will stir to stimulation, but will not awaken or follow commands.  She is unable to participate in history or elements of the physical exam.  HENT:  Head: Normocephalic and atraumatic.  Pt is unable to cooperate with oropharyngeal exam.  Eyes: Pupils are equal, round, and reactive to light.  Sclera are injected bilaterally.  She is unable to cooperate with EOM exam.  Neck: Normal range of motion. Neck supple. No JVD present. No tracheal deviation present. No thyromegaly present.  Cardiovascular: Normal rate, regular rhythm, normal heart sounds and intact distal pulses.  Exam reveals no gallop and no friction rub.   No murmur heard. Respiratory: Effort normal and breath sounds normal. No stridor. No respiratory distress. She has no wheezes. She has no rales. She exhibits no tenderness.  GI: Soft. Bowel sounds are normal. She exhibits no distension and no mass. There is no tenderness. There is no rebound and no guarding.  Musculoskeletal: Normal range of motion. She exhibits no edema and no tenderness.  Lymphadenopathy:    She has no cervical adenopathy.  Neurological: She displays abnormal reflex. She exhibits abnormal muscle tone.  As previously stated the patient is chemically obtunded and unable to participate with history and/or elements of the physical exam.  When stimulated the patient does move and rolls from one side to the other.  She appears to be moving all extremities.  Skin: Skin is warm and dry. No rash noted. No erythema. No pallor.  Psychiatric:  Pt is unable to cooperate with exam. I believe that by virtue of the fact that the patient has taken a life threatening overdose she has exhibited poor judgement and questionable thought content.     Assessment/Plan 1. Poisoning by intentional overdose  of carbamezapine. The  patient has taken #41 200 mg tegretol tablets.  She did receive charcoal within an hour of ingestion, but she remains chemically obtunded.  Poison control has recommended observation on telemetry for changes in QRS morphology and QT prolongation for a period from 33 hours to 72 hours from the time of ingestion which was roughly 1600 06/11/2014.  She will be kept NPO, receive IV fluids, and will be monitored on telemetry with an EKG to be performed for comparison sake in the am.  Her electrolytes and LFT's will also be monitored. 2. Hypotension due to medication.  The patient's systolic blood pressure has been in the 90's. She will be closely monitored and receive IV fluids to treat her blood pressure.  Pressors will be used if it becomes necessary. 3. Decreased level of consciousness.  Pt will be placed on aspiration precautions, although she appears to be protecting her airway.  She will be closely monitores 4. Suicide attempt.  The patient should have a psychiatric evaluation and go for inpatient psychiatric treatment for this life threatening suicide attempt and depression.  Miel Wisener 06/11/2014, 8:37 PM

## 2014-06-11 NOTE — ED Notes (Signed)
Per EMS, 40 minutes ago, she took 7242 Tegretols that was her boyfriends as well as some other pills, but was not sure how many. She states that has been stressed out and just "wanted to end it." No complaints of pain. Generalized weakness due to psych drugs she took. This was the first time she has attempted suicide. She has superficial cut marks on left wrist made last night. No alcohol involved.

## 2014-06-11 NOTE — ED Notes (Signed)
Attempted report times one.

## 2014-06-11 NOTE — ED Notes (Signed)
Charcoal given

## 2014-06-12 LAB — CBC
HEMATOCRIT: 35.7 % — AB (ref 36.0–46.0)
HEMOGLOBIN: 11.8 g/dL — AB (ref 12.0–15.0)
MCH: 30.9 pg (ref 26.0–34.0)
MCHC: 33.1 g/dL (ref 30.0–36.0)
MCV: 93.5 fL (ref 78.0–100.0)
Platelets: 173 10*3/uL (ref 150–400)
RBC: 3.82 MIL/uL — AB (ref 3.87–5.11)
RDW: 13.2 % (ref 11.5–15.5)
WBC: 8.4 10*3/uL (ref 4.0–10.5)

## 2014-06-12 LAB — URINALYSIS, ROUTINE W REFLEX MICROSCOPIC
BILIRUBIN URINE: NEGATIVE
Glucose, UA: NEGATIVE mg/dL
Hgb urine dipstick: NEGATIVE
KETONES UR: NEGATIVE mg/dL
Leukocytes, UA: NEGATIVE
Nitrite: NEGATIVE
PH: 6 (ref 5.0–8.0)
Protein, ur: 30 mg/dL — AB
Specific Gravity, Urine: 1.017 (ref 1.005–1.030)
Urobilinogen, UA: 0.2 mg/dL (ref 0.0–1.0)

## 2014-06-12 LAB — CARBAMAZEPINE LEVEL, TOTAL
CARBAMAZEPINE LVL: 34.4 ug/mL — AB (ref 4.0–12.0)
Carbamazepine Lvl: 39.4 ug/mL (ref 4.0–12.0)
Carbamazepine Lvl: 41.4 ug/mL (ref 4.0–12.0)

## 2014-06-12 LAB — URINE MICROSCOPIC-ADD ON

## 2014-06-12 LAB — HEPATIC FUNCTION PANEL
ALK PHOS: 95 U/L (ref 39–117)
ALT: 104 U/L — AB (ref 0–35)
AST: 53 U/L — ABNORMAL HIGH (ref 0–37)
Albumin: 3.5 g/dL (ref 3.5–5.2)
Bilirubin, Direct: <0.2 mg/dL (ref 0.0–0.3)
TOTAL PROTEIN: 7.1 g/dL (ref 6.0–8.3)
Total Bilirubin: 0.2 mg/dL — ABNORMAL LOW (ref 0.3–1.2)

## 2014-06-12 LAB — BASIC METABOLIC PANEL
Anion gap: 11 (ref 5–15)
BUN: 10 mg/dL (ref 6–23)
CALCIUM: 8.1 mg/dL — AB (ref 8.4–10.5)
CO2: 24 mEq/L (ref 19–32)
CREATININE: 0.59 mg/dL (ref 0.50–1.10)
Chloride: 103 mEq/L (ref 96–112)
GFR calc non Af Amer: >90 mL/min (ref >90)
Glucose, Bld: 88 mg/dL (ref 70–99)
Potassium: 3.9 mEq/L (ref 3.7–5.3)
Sodium: 138 mEq/L (ref 137–147)

## 2014-06-12 MED ORDER — LORAZEPAM 2 MG/ML IJ SOLN: 2.0000 mg | Freq: Four times a day (QID) | INTRAMUSCULAR | Status: DC | PRN

## 2014-06-12 MED ORDER — KCL IN DEXTROSE-NACL 20-5-0.45 MEQ/L-%-% IV SOLN
INTRAVENOUS | Status: DC
  Administered 2014-06-12 – 2014-06-14 (×7): via INTRAVENOUS
  Filled 2014-06-12 (×10): qty 1000

## 2014-06-12 NOTE — Progress Notes (Addendum)
[facility] PROGRESS NOTE  Patty GentaHeather E Valdez WUJ:[id number]:[id number] DOB: [birth year] DOA: 06/11/2014 PCP: Jearld LeschWILLIAMS,DWIGHT M, MD  Assessment/Plan:  Active Problems:   Drug overdose/Decreased level of consciousness addendum: Tegretol od - Will monitor in telemetry  - provide supportive therapy - keep patient npo and place on mivf's    Hypotension due to drugs - resolved    Suicide attempt by drug ingestion - Consulted psychiatrist for further evaluation and recommendations.    Poisoning by carbamazepine - Per HPI poison control has recommended monitoring patient for 33-72 hours. Ingestion was at 06/11/14 1600   Code Status: full Family Communication: discussed with husband at bedside. Disposition Plan: Continue to monitor on telemetry   Consultants:  Psychiatry  Procedures:  none  Antibiotics:  none   HPI/Subjective: No new complaints. No acute issues reported overnight.  Objective: Filed Vitals:   06/12/14 0508  BP: 122/84  Pulse: 81  Temp: 98.1 F (36.7 C)  Resp: 16   No intake or output data in the 24 hours ending 06/12/14 1205 Filed Weights   06/11/14 2116  Weight: 79.833 kg (176 lb)    Exam:   General:  Pt in nad, confused with incomprehensible speech, laying in bed  Cardiovascular: rrr, no mrg  Respiratory: cta bl, no wheezes  Abdomen: soft, NT, ND   Musculoskeletal: no cyanosis or clubbing   Data Reviewed: Basic Metabolic Panel:  Recent Labs Lab 06/11/14 1713 06/12/14 0623  NA 137 138  K 4.4 3.9  CL 102 103  CO2 23 24  GLUCOSE 91 88  BUN 11 10  CREATININE 0.54 0.59  CALCIUM 9.5 8.1*  MG 2.1  --    Liver Function Tests:  Recent Labs Lab 06/11/14 1713 06/12/14 1000  AST 57* 53*  ALT 118* 104*  ALKPHOS 117 95  BILITOT <0.2* 0.2*  PROT 7.9 7.1  ALBUMIN 3.9 3.5   No results found for this basename: LIPASE, AMYLASE,  in the last 168 hours No results found for this basename: AMMONIA,  in the last 168 hours CBC:  Recent  Labs Lab 06/11/14 1713 06/12/14 0623  WBC 7.9 8.4  NEUTROABS 4.9  --   HGB 13.1 11.8*  HCT 38.8 35.7*  MCV 93.5 93.5  PLT 253 173   Cardiac Enzymes: No results found for this basename: CKTOTAL, CKMB, CKMBINDEX, TROPONINI,  in the last 168 hours BNP (last 3 results) No results found for this basename: PROBNP,  in the last 8760 hours CBG: No results found for this basename: GLUCAP,  in the last 168 hours  No results found for this or any previous visit (from the past 240 hour(s)).   Studies: No results found.  Scheduled Meds: . enoxaparin (LOVENOX) injection  40 mg Subcutaneous Q24H  . sodium chloride  3 mL Intravenous Q12H   Continuous Infusions: . dextrose 5 % and 0.45 % NaCl with KCl 20 mEq/L 100 mL/hr at 06/12/14 1056    Time spent: > 35 minutes    Penny PiaVEGA, Hassie Mandt  [facility] Pager [phone]. If 7PM-7AM, please contact night-coverage at [email or web address], password [email or web address] 06/12/2014, 12:05 PM  LOS: 1 day

## 2014-06-12 NOTE — Progress Notes (Signed)
CRITICAL VALUE ALERT  Critical value received: Tegretol level 41.4  Date of notification:  06/12/14  Time of notification:  1255  Critical value read back: yes  Nurse who received alert: Judith BlonderAnita Kruti Horacek,RN  MD notified (1st page): Cena BentonVega  Time of first page:  1255  MD notified (2nd page):no 2nd page  Time of second page:no 2nd page  Responding MD:  Cena BentonVega  Time MD responded:  1257

## 2014-06-12 NOTE — Progress Notes (Signed)
CRITICAL VALUE ALERT  Critical value received:  Tegretol level 34.4  Date of notification:  06/12/14  Time of notification:  1320  Critical value read back: yes  Nurse who received alert: Philbert RiserAnita Santiago Stenzel, RN  MD notified (1st page):  [facility] aware of elevated value which is an improvement from last  Time of first page:  1320  MD notified (2nd page): no 2nd page  Time of second page: no 2nd page  Responding MD:    Time MD responded:

## 2014-06-12 NOTE — Progress Notes (Signed)
CRITICAL VALUE ALERT  Critical value received: Carbamazepine 39.4   Date of notification:  06/12/14   Time of notification:  0340   Critical value read back:Yes.    Nurse who received alert:  Rohm and HaasQueen Core    Midlevel notified (1st page):  Burnadette PeterLynch  Time of first page:  [phone]

## 2014-06-13 LAB — CARBAMAZEPINE LEVEL, TOTAL
CARBAMAZEPINE LVL: 1.4 ug/mL — AB (ref 4.0–12.0)
Carbamazepine Lvl: 28.4 ug/mL (ref 4.0–12.0)
Carbamazepine Lvl: 33.1 ug/mL (ref 4.0–12.0)
Carbamazepine Lvl: 18.8 ug/mL (ref 4.0–12.0)
Carbamazepine Lvl: 14.2 ug/mL — ABNORMAL HIGH (ref 4.0–12.0)

## 2014-06-13 LAB — COMPREHENSIVE METABOLIC PANEL
ALBUMIN: 3.3 g/dL — AB (ref 3.5–5.2)
ALT: 92 U/L — ABNORMAL HIGH (ref 0–35)
ANION GAP: 13 (ref 5–15)
AST: 45 U/L — ABNORMAL HIGH (ref 0–37)
Alkaline Phosphatase: 90 U/L (ref 39–117)
BILIRUBIN TOTAL: 0.3 mg/dL (ref 0.3–1.2)
BUN: 6 mg/dL (ref 6–23)
CO2: 22 meq/L (ref 19–32)
CREATININE: 0.54 mg/dL (ref 0.50–1.10)
Calcium: 8.2 mg/dL — ABNORMAL LOW (ref 8.4–10.5)
Chloride: 99 mEq/L (ref 96–112)
GFR calc Af Amer: >90 mL/min (ref >90)
GFR calc non Af Amer: >90 mL/min (ref >90)
Glucose, Bld: 103 mg/dL — ABNORMAL HIGH (ref 70–99)
Potassium: 3.7 mEq/L (ref 3.7–5.3)
Sodium: 134 mEq/L — ABNORMAL LOW (ref 137–147)
Total Protein: 6.6 g/dL (ref 6.0–8.3)

## 2014-06-13 LAB — CK: Total CK: 75 U/L (ref 7–177)

## 2014-06-13 MED ORDER — VITAMINS A & D EX OINT
TOPICAL_OINTMENT | CUTANEOUS | Status: AC
  Administered 2014-06-13: 16:00:00
  Filled 2014-06-13: qty 5

## 2014-06-13 NOTE — Progress Notes (Signed)
[facility] PROGRESS NOTE  Renold GentaHeather E Valdez ZOX:[id number] DOB: July 07, 1986 DOA: 06/11/2014 PCP: Jearld LeschWILLIAMS,DWIGHT M, MD  Assessment/Plan:  Active Problems:   Drug overdose/Decreased level of consciousness (Tegretol od) - Tegretol level trending down. - provide supportive therapy - Given improvement in mentation will plan on advancing diet.    Hypotension due to drugs - resolved    Suicide attempt by drug ingestion - Consulted psychiatrist for further evaluation and recommendations.    Poisoning by carbamazepine - Per HPI poison control has recommended monitoring patient for 33-72 hours. Ingestion was at 06/11/14 1600   Code Status: full Family Communication: discussed with husband at bedside. Disposition Plan: With continued improvement [facility] next am 06/14/14   Consultants:  Psychiatry  Procedures:  none  Antibiotics:  none   HPI/Subjective: Pt reports feeling better. She would like to eat.  Objective: Filed Vitals:   06/13/14 0627  BP: 106/66  Pulse: 77  Temp: 98.6 F (37 C)  Resp: 16    Intake/Output Summary (Last 24 hours) at 06/13/14 1313 Last data filed at 06/13/14 0630  Gross per 24 hour  Intake 709.67 ml  Output    300 ml  Net 409.67 ml   Filed Weights   06/11/14 2116  Weight: 79.833 kg (176 lb)    Exam:   General:  Pt in nad, Alert and awake  Cardiovascular: rrr, no mrg  Respiratory: cta bl, no wheezes  Abdomen: soft, NT, ND   Musculoskeletal: no cyanosis or clubbing   Data Reviewed: Basic Metabolic Panel:  Recent Labs Lab 06/11/14 1713 06/12/14 0623 06/13/14 0613  NA 137 138 134*  K 4.4 3.9 3.7  CL 102 103 99  CO2 23 24 22   GLUCOSE 91 88 103*  BUN 11 10 6   CREATININE 0.54 0.59 0.54  CALCIUM 9.5 8.1* 8.2*  MG 2.1  --   --    Liver Function Tests:  Recent Labs Lab 06/11/14 1713 06/12/14 1000 06/13/14 0613  AST 57* 53* 45*  ALT 118* 104* 92*  ALKPHOS 117  95 90  BILITOT <0.2* 0.2* 0.3  PROT 7.9 7.1 6.6  ALBUMIN 3.9 3.5 3.3*   No results found for this basename: LIPASE, AMYLASE,  in the last 168 hours No results found for this basename: AMMONIA,  in the last 168 hours CBC:  Recent Labs Lab 06/11/14 1713 06/12/14 0623  WBC 7.9 8.4  NEUTROABS 4.9  --   HGB 13.1 11.8*  HCT 38.8 35.7*  MCV 93.5 93.5  PLT 253 173   Cardiac Enzymes:  Recent Labs Lab 06/13/14 1202  CKTOTAL 75   BNP (last 3 results) No results found for this basename: PROBNP,  in the last 8760 hours CBG: No results found for this basename: GLUCAP,  in the last 168 hours  No results found for this or any previous visit (from the past 240 hour(s)).   Studies: No results found.  Scheduled Meds: . enoxaparin (LOVENOX) injection  40 mg Subcutaneous Q24H  . sodium chloride  3 mL Intravenous Q12H   Continuous Infusions: . dextrose 5 % and 0.45 % NaCl with KCl 20 mEq/L 150 mL/hr at 06/13/14 0436    Time spent: > 35 minutes    Penny PiaVEGA, Abrey Bradway  [facility] Pager [phone]. If 7PM-7AM, please contact night-coverage at [email or web address], password [email or web address] 06/13/2014, 1:13 PM  LOS: 2 days

## 2014-06-13 NOTE — Progress Notes (Signed)
CRITICAL VALUE ALERT  Critical value received:  Tegretol level 28.4   Date of notification:  06/13/14  Time of notification:  0400          Critical value read back:Yes.    Nurse who received alert:  Shayne AlkenJemalyn Bay Wayson, RN  MD notified (1st page):  M. Lynch made aware of elevated value which is an improvement from last  Time of first page:  0400  MD notified (2nd page):  Time of second page:  Responding MD:  M. Lynch  Time MD responded:  [phone]

## 2014-06-13 NOTE — Progress Notes (Signed)
CRITICAL VALUE ALERT  Critical value received:  Tegretol Level 18.8  Date of notification:  06/13/14  Time of notification:  0855  Critical value read back: yes  Nurse who received alert:  Judith BlonderAnita Chasitie Passey,RN  MD notified (1st page):  Dr. Cena BentonVega aware of elevated level; current level is an improvement  Time of first page:  No page  MD notified (2nd page): no page  Time of second page: no page  Responding MD:  No page  Time MD responded:  No page

## 2014-06-14 ENCOUNTER — Encounter (HOSPITAL_COMMUNITY): Payer: Self-pay | Admitting: *Deleted

## 2014-06-14 ENCOUNTER — Inpatient Hospital Stay (HOSPITAL_COMMUNITY)
Admission: EM | Admit: 2014-06-14 | Discharge: 2014-06-17 | DRG: 885 | Disposition: A | Discharge status: Home / Self Care | Payer: Medicaid Other | Source: Intra-hospital | Attending: Psychiatry | Admitting: Psychiatry

## 2014-06-14 DIAGNOSIS — R45851 Suicidal ideations: Secondary | ICD-10-CM | POA: Diagnosis not present

## 2014-06-14 DIAGNOSIS — K219 Gastro-esophageal reflux disease without esophagitis: Secondary | ICD-10-CM | POA: Diagnosis present

## 2014-06-14 DIAGNOSIS — F332 Major depressive disorder, recurrent severe without psychotic features: Secondary | ICD-10-CM | POA: Diagnosis present

## 2014-06-14 DIAGNOSIS — G4700 Insomnia, unspecified: Secondary | ICD-10-CM | POA: Diagnosis present

## 2014-06-14 DIAGNOSIS — F411 Generalized anxiety disorder: Secondary | ICD-10-CM | POA: Diagnosis present

## 2014-06-14 DIAGNOSIS — Z5989 Other problems related to housing and economic circumstances: Secondary | ICD-10-CM | POA: Diagnosis not present

## 2014-06-14 DIAGNOSIS — F17200 Nicotine dependence, unspecified, uncomplicated: Secondary | ICD-10-CM | POA: Diagnosis present

## 2014-06-14 DIAGNOSIS — Z598 Other problems related to housing and economic circumstances: Secondary | ICD-10-CM

## 2014-06-14 DIAGNOSIS — Z5987 Material hardship due to limited financial resources, not elsewhere classified: Secondary | ICD-10-CM

## 2014-06-14 DIAGNOSIS — F4310 Post-traumatic stress disorder, unspecified: Secondary | ICD-10-CM | POA: Diagnosis present

## 2014-06-14 DIAGNOSIS — T50902A Poisoning by unspecified drugs, medicaments and biological substances, intentional self-harm, initial encounter: Secondary | ICD-10-CM

## 2014-06-14 LAB — URINE CULTURE
Colony Count: NO GROWTH
Culture: NO GROWTH

## 2014-06-14 LAB — CARBAMAZEPINE LEVEL, TOTAL
CARBAMAZEPINE LVL: 10.6 ug/mL (ref 4.0–12.0)
CARBAMAZEPINE LVL: 9.4 ug/mL (ref 4.0–12.0)
Carbamazepine Lvl: 12.3 ug/mL — ABNORMAL HIGH (ref 4.0–12.0)
Carbamazepine Lvl: 7 ug/mL (ref 4.0–12.0)

## 2014-06-14 MED ORDER — ALUM & MAG HYDROXIDE-SIMETH 200-200-20 MG/5ML PO SUSP: 30.0000 mL | ORAL | Status: DC | PRN

## 2014-06-14 MED ORDER — HYDROXYZINE HCL 25 MG PO TABS
25.0000 mg | ORAL_TABLET | Freq: Four times a day (QID) | ORAL | Status: DC | PRN
  Administered 2014-06-14 – 2014-06-17 (×7): 25 mg via ORAL
  Filled 2014-06-14 (×6): qty 1
  Filled 2014-06-14: qty 30
  Filled 2014-06-14: qty 1

## 2014-06-14 MED ORDER — TRAZODONE HCL 50 MG PO TABS
50.0000 mg | ORAL_TABLET | Freq: Every evening | ORAL | Status: DC | PRN
  Administered 2014-06-14 – 2014-06-16 (×3): 50 mg via ORAL
  Filled 2014-06-14 (×7): qty 1
  Filled 2014-06-14: qty 28
  Filled 2014-06-14 (×2): qty 1

## 2014-06-14 MED ORDER — VALACYCLOVIR HCL 500 MG PO TABS
500.0000 mg | ORAL_TABLET | Freq: Two times a day (BID) | ORAL | Status: DC
  Administered 2014-06-15 – 2014-06-17 (×5): 500 mg via ORAL
  Filled 2014-06-14 (×10): qty 1

## 2014-06-14 MED ORDER — PAROXETINE HCL 10 MG PO TABS
10.0000 mg | ORAL_TABLET | Freq: Every day | ORAL | Status: DC
  Administered 2014-06-15: 10 mg via ORAL
  Filled 2014-06-14 (×4): qty 1

## 2014-06-14 MED ORDER — PANTOPRAZOLE SODIUM 20 MG PO TBEC
20.0000 mg | DELAYED_RELEASE_TABLET | Freq: Every day | ORAL | Status: DC
  Administered 2014-06-15 – 2014-06-17 (×3): 20 mg via ORAL
  Filled 2014-06-14 (×5): qty 1

## 2014-06-14 MED ORDER — NAPROXEN 500 MG PO TABS
500.0000 mg | ORAL_TABLET | Freq: Two times a day (BID) | ORAL | Status: DC
  Administered 2014-06-15 – 2014-06-17 (×4): 500 mg via ORAL
  Filled 2014-06-14 (×9): qty 1

## 2014-06-14 MED ORDER — MAGNESIUM HYDROXIDE 400 MG/5ML PO SUSP: 30.0000 mL | Freq: Every day | ORAL | Status: DC | PRN

## 2014-06-14 NOTE — Progress Notes (Signed)
Report called to Gigi GinPeggy, RN at [facility].

## 2014-06-14 NOTE — Progress Notes (Signed)
Clinical Social Work Department BRIEF PSYCHOSOCIAL ASSESSMENT 06/14/2014  Patient:  Patty Valdez, Patty Valdez     Account Number:  [id number]     Admit date:  06/11/2014  Clinical Social Worker:  Earlie Server  Date/Time:  06/14/2014 04:00 PM  Referred by:  Physician  Date Referred:  06/14/2014 Referred for  Psychosocial assessment   Other Referral:   Interview type:  Patient Other interview type:    PSYCHOSOCIAL DATA Living Status:  FAMILY Admitted from facility:   Level of care:   Primary support name:  Butch Penny Primary support relationship to patient:  PARENT Degree of support available:   Adequate    CURRENT CONCERNS Current Concerns  Post-Acute Placement   Other Concerns:    SOCIAL WORK ASSESSMENT / PLAN CSW received referral due to patient being admitted with an overdose attempt. CSW spoke with psych MD who evaluated patient and reports that patient needs inpatient placement. CSW reviewed chart and met with patient and husband at bedside. CSW introduced myself and explained role. Patient agreeable for husband to be involved in assessment.    Patient reports that she was admitted after an overdose attempt. Patient reports that she currently receives treatment through a therapist at [facility]. Patient does not have a psychiatrist and PCP prescribes medications. Patient is aware of inpatient placement and reports she will consider seeing a psychiatrist on outpatient basis.    Patient has been accepted to [facility] at 7pm. Patient signed voluntary form which was faxed to [facility] and original copy placed on chart. RN to call report to [phone]. CSW arranged [place] to provide transportation at [place] is signing off but available if needed.   Assessment/plan status:  No Further Intervention Required Other assessment/ plan:   Information/referral to community resources:   Will DC to [facility]    PATIENT'S/FAMILY'S RESPONSE TO PLAN OF CARE: Patient alert and oriented with flat  affect. Patient minimally engaged and reports she "just wants to get better." Patient aware that inpatient treatment could be beneficial and wants her husband to be involved with treatment team. Patient agreeable to go to [facility] and signed voluntary form. Patient reports no further questions and thanked CSW for explaining information.       [place], [place] [phone]
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
## 2014-06-14 NOTE — Progress Notes (Addendum)
Clinical Social Work  Per MD, patient is medically stable to DC. CSW spoke with psych MD who reports that patient requires inpatient placement. CSW contacted the following facilities and will fax referral once psych MD note is placed in chart.  [facility]- unsure of bed availability but encouraged CSW to send information.   [facility]- AC Minerva Areola(Eric) reports bed status will be determined around 1 pm  [facility].   [facility]- no available beds  [facility]- does not accept patient's insurance  CSW will continue to follow and will complete full assessment.  Unk LightningHolly Elvyn Krohn, [place] [phone]  Addendum [phone] Referrals faxed to [facility] and [facility].

## 2014-06-14 NOTE — Progress Notes (Signed)
Clinical Social Work  CSW spoke with AC (Tina) at [facility] who anticipates bed will be available today. AC reports she will call CSW back once bed is available or encouraged CSW to call her back at 3:30pm if CSW has not heard from AC. CSW will continue to follow.  Fleetwood Pierron, LCSW [phone]  

## 2014-06-14 NOTE — Discharge Summary (Signed)
Physician Discharge Summary  Patty GentaHeather E Valdez ZOX:[id number] DOB: 1986/08/28 DOA: 06/11/2014  PCP: Jearld LeschWILLIAMS,DWIGHT M, MD  Admit date: 06/11/2014 Discharge date: 06/14/2014  Time spent: > 35 minutes  Recommendations for Outpatient Follow-up:  1. Pt will need continued psychiatric services given recent intentional drug overdose with tegretol  Discharge Diagnoses:  Active Problems:   Drug overdose   Decreased level of consciousness   Hypotension due to drugs   Suicide attempt by drug ingestion   Poisoning by carbamazepine   Discharge Condition: stable with [facility]  Diet recommendation: regular diet.  Filed Weights   06/11/14 2116  Weight: 79.833 kg (176 lb)    History of present illness:  28 y/o who presented to the hospital after intentional overdose on tegretol  Hospital Course:  Active Problems:  Drug overdose/Decreased level of consciousness (Tegretol od)  - Tegretol level within normal limits on day of discharge - Given improvement in mentation will plan on advancing diet.   Seizure d/o - May have pharmacy confirm home dose and if this does not interfere with psychiatrist recommendations may consider continuing home tegretol. - If any other questions arise regarding medication management would consult neurologist for further recommendations. - Clonazepam on board.  Hypotension due to drugs  - resolved   Suicide attempt by drug ingestion  - Consulted psychiatrist for further evaluation and recommendations.  - Disposition per psychiatry.  Medically cleared [facility] out of hospital and [facility] other [facility].  Poisoning by carbamazepine  - resolving and tegretol levels within normal limits on last check.   Procedures:  None  Consultations:  Psychiatry  Discharge Exam: Filed Vitals:   06/14/14 0523  BP: 121/70  Pulse: 67  Temp: 98.2 F (36.8 C)  Resp: 18    General: pt in nad, alert and  awake Cardiovascular: rrr, no mrg Respiratory: cta bl, no wheezes  Discharge Instructions You were cared for by a hospitalist during your hospital stay. If you have any questions about your discharge medications or the care you received while you were in the hospital after you are discharged, you can call the unit and asked to speak with the hospitalist on call if the hospitalist that took care of you is not available. Once you are discharged, your primary care physician will handle any further medical issues. Please note that NO REFILLS for any discharge medications will be authorized once you are discharged, as it is imperative that you return to your primary care physician (or establish a relationship with a primary care physician if you do not have one) for your aftercare needs so that they can reassess your need for medications and monitor your lab values.  Discharge Instructions   Call MD for:  extreme fatigue    Complete by:  As directed      Diet - low sodium heart healthy    Complete by:  As directed      Discharge instructions    Complete by:  As directed   To be determined by psychiatrist.     Increase activity slowly    Complete by:  As directed             Medication List    TAKE these medications       clonazePAM 1 MG tablet  Commonly known as:  KLONOPIN  Take 1 mg by mouth 2 (two) times daily as needed for anxiety.     fenofibrate 48 MG tablet  Commonly known as:  TRICOR  Take 48 mg by mouth daily.     PARoxetine 10 MG tablet  Commonly known as:  PAXIL  Take 10 mg by mouth daily.     pravastatin 20 MG tablet  Commonly known as:  PRAVACHOL  Take 20 mg by mouth daily.     valACYclovir 500 MG tablet  Commonly known as:  VALTREX  Take 500 mg by mouth 2 (two) times daily.      ASK your doctor about these medications       pantoprazole 20 MG tablet  Commonly known as:  PROTONIX  - Take 20 mg by mouth daily as needed. Heart burn  -        No Known  Allergies    The results of significant diagnostics from this hospitalization (including imaging, microbiology, ancillary and laboratory) are listed below for reference.    Significant Diagnostic Studies: No results found.  Microbiology: No results found for this or any previous visit (from the past 240 hour(s)).   Labs: Basic Metabolic Panel:  Recent Labs Lab 06/11/14 1713 06/12/14 0623 06/13/14 0613  NA 137 138 134*  K 4.4 3.9 3.7  CL 102 103 99  CO2 23 24 22   GLUCOSE 91 88 103*  BUN 11 10 6   CREATININE 0.54 0.59 0.54  CALCIUM 9.5 8.1* 8.2*  MG 2.1  --   --    Liver Function Tests:  Recent Labs Lab 06/11/14 1713 06/12/14 1000 06/13/14 0613  AST 57* 53* 45*  ALT 118* 104* 92*  ALKPHOS 117 95 90  BILITOT <0.2* 0.2* 0.3  PROT 7.9 7.1 6.6  ALBUMIN 3.9 3.5 3.3*   No results found for this basename: LIPASE, AMYLASE,  in the last 168 hours No results found for this basename: AMMONIA,  in the last 168 hours CBC:  Recent Labs Lab 06/11/14 1713 06/12/14 0623  WBC 7.9 8.4  NEUTROABS 4.9  --   HGB 13.1 11.8*  HCT 38.8 35.7*  MCV 93.5 93.5  PLT 253 173   Cardiac Enzymes:  Recent Labs Lab 06/13/14 1202  CKTOTAL 75   BNP: BNP (last 3 results) No results found for this basename: PROBNP,  in the last 8760 hours CBG: No results found for this basename: GLUCAP,  in the last 168 hours     Signed:  Penny Pia  [facility] 06/14/2014, 10:17 AM

## 2014-06-14 NOTE — Tx Team (Signed)
Initial Interdisciplinary Treatment Plan  PATIENT STRENGTHS: (choose at least two) Ability for insight Active sense of humor Average or above average intelligence Capable of independent living Communication skills General fund of knowledge Motivation for treatment/growth Physical Health Special hobby/interest Supportive family/friends Work skills  PATIENT STRESSORS: Paediatric nurseinancial difficulties Legal issue Marital or family conflict Occupational concerns Traumatic event   PROBLEM LIST: Problem List/Patient Goals Date to be addressed Date deferred Reason deferred Estimated date of resolution  "I guess my depression" 06/14/14     "To appreciate myself, to feel better about myself" 06/14/14     Depression 06/14/14     Increased risk for suicide 06/14/14                                    DISCHARGE CRITERIA:  Ability to meet basic life and health needs Adequate post-discharge living arrangements Improved stabilization in mood, thinking, and/or behavior Medical problems require only outpatient monitoring Motivation to continue treatment in a less acute level of care Need for constant or close observation no longer present Reduction of life-threatening or endangering symptoms to within safe limits Safe-care adequate arrangements made Verbal commitment to aftercare and medication compliance  PRELIMINARY DISCHARGE PLAN: Outpatient therapy Participate in family therapy Return to previous living arrangement  PATIENT/FAMIILY INVOLVEMENT: This treatment plan has been presented to and reviewed with the patient, Renold GentaHeather E Valdez, and/or family member.  The patient and family have been given the opportunity to ask questions and make suggestions.  Fransico MichaelBrooks, Jan Walters [facility] 06/14/2014, 8:33 PM

## 2014-06-15 MED ORDER — CITALOPRAM HYDROBROMIDE 10 MG PO TABS
10.0000 mg | ORAL_TABLET | Freq: Every day | ORAL | Status: DC
  Administered 2014-06-15 – 2014-06-17 (×3): 10 mg via ORAL
  Filled 2014-06-15 (×4): qty 1

## 2014-06-15 NOTE — [facility] Suicide Risk Assessment (Signed)
[facility] INPATIENT:  Family/Significant Other Suicide Prevention Education  Suicide Prevention Education:  Education Completed; Patty NakayamaDarion Valdez (pt's husband) [phone] has been identified by the patient as the family member/significant other with whom the patient will be residing, and identified as the person(s) who will aid the patient in the event of a mental health crisis (suicidal ideations/suicide attempt).  With written consent from the patient, the family member/significant other has been provided the following suicide prevention education, prior to the and/or following the discharge of the patient.  The suicide prevention education provided includes the following:  Suicide risk factors  Suicide prevention and interventions  National Suicide Hotline telephone number  [facility] assessment telephone number  [facility] Emergency Assistance 911  [facility] and/or Residential Mobile Crisis Unit telephone number  Request made of family/significant other to:  Remove weapons (e.g., guns, rifles, knives), all items previously/currently identified as safety concern.    Remove drugs/medications (over-the-counter, prescriptions, illicit drugs), all items previously/currently identified as a safety concern.  The family member/significant other verbalizes understanding of the suicide prevention education information provided.  The family member/significant other agrees to remove the items of safety concern listed above.  Smart, Kashlyn LCSWA 06/15/2014, 1:58 PM
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
## 2014-06-15 NOTE — Progress Notes (Signed)
Patient ID: Patty Valdez, female   DOB: Oct 06, 1986, 28 y.o.   MRN: [id number]   D: Pt was pleasant and cooperative, but still sad during the assessment. Pt smiled on approach and informed the writer that she may be discharged Wed or Thurs. Stated, "I'm trying not to cry". Stated she had family visit today and was sad when they left. Pt also stated, "when I hear these people stories I realize it's not so bad." When asked about her discharge pt stated "I plan to look for a job". Writer also encouraged pt to continue treatment.  A:  Support and encouragement was offered. 15 min checks continued for safety.  R: Pt remains safe.

## 2014-06-15 NOTE — Progress Notes (Signed)
Recreation Therapy Notes  Animal-Assisted Activity/Therapy (AAA/T) Program Checklist/Progress Notes Patient Eligibility Criteria Checklist & Daily Group note for Rec Tx Intervention  Date: 07.28.2015 Time: 3:15pm Location: [address] Dayroom   AAA/T Program Assumption of Risk Form signed by Patient/ or Parent Legal Guardian yes  Patient is free of allergies or sever asthma yes  Patient reports no fear of animals yes  Patient reports no history of cruelty to animals yes   Patient understands his/her participation is voluntary yes  Behavioral Response: Did not attend.    Marvin Grabill L Jarquis Walker, LRT/CTRS  Eneida Evers L 06/15/2014 4:33 PM 

## 2014-06-15 NOTE — Progress Notes (Signed)
Patient ID: Patty GentaHeather E Valdez, female   DOB: Sep 20, 1986, 28 y.o.   MRN: [id number] She has been up most of the day and to groups when up.  Has interaction with peers and staaff. Self inventory this AM: depression and anxiety at 9, hopelessness 2 , denies SI thoughts and c/o headache. Did not request prn.  Her goal : getting better and going home,how by talking to the MD.

## 2014-06-15 NOTE — Progress Notes (Signed)
The focus of this group is to educate the patient on the purpose and policies of crisis stabilization and provide a format to answer questions about their admission.  The group details unit policies and expectations of patients while admitted. Did not attend.  

## 2014-06-15 NOTE — B[facility] Group Notes (Signed)
[facility] 06/15/2014 1:15pm  Type of Therapy: Mental Health Association Presentation  Participation Level: Active  Participation Quality: Attentive  Affect: Appropriate  Cognitive: Oriented  Insight: Developing/Improving  Engagement in Therapy: Engaged  Modes of Intervention: Discussion, Education and Socialization  Summary of Progress/Problems: Mental Health Association (MHA) Speaker came to talk about his personal journey with substance abuse and addiction. The pt processed ways by which to relate to the speaker. MHA speaker provided handouts and educational information pertaining to groups and services offered by [facility]. Pt was attentive to speaker and took materials about MHA.     Chad CordialLauren Carter, LCSWA 06/15/2014 2:38 PM
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
## 2014-06-15 NOTE — Progress Notes (Addendum)
Writer took over this pt's care at 0300. D: Pt in bed resting with eyes closed. Respirations even and unlabored. Pt appears to be in no signs of distress at this time. A: Q1215min checks remains for this pt. R: Pt remains safe at this time.
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
## 2014-06-15 NOTE — Progress Notes (Signed)
Patient attended AA meeting. Tonight we had a speaker meeting. They actively listened. Patty Valdez A 11:23 PM  

## 2014-06-16 DIAGNOSIS — F411 Generalized anxiety disorder: Secondary | ICD-10-CM

## 2014-06-16 DIAGNOSIS — F332 Major depressive disorder, recurrent severe without psychotic features: Principal | ICD-10-CM

## 2014-06-16 NOTE — Progress Notes (Signed)
Patient ID: Patty GentaHeather E Valdez, female   DOB: Mar 12, 1986, 28 y.o.   MRN: [id number]  D: Writer observed pt laying in bed prior to the assessment.  When asked pt stated she left group when she found out it was NA. "I don't have have substance abuse issues". Pt was slightly upset that her CM told her husband that she might be discharged Thurs or Fri. However, pt stated that she was initially informed that she would be discharged today (Wed) or Thurs.  Pt states she goes to groups during the day but that theres "a girl on the hall that she doesn't want to be around". Stated the female peer says what she wants and gets into people's business. Pt stated she'd rather not be around her if she has a choice.   A:  Support and encouragement was offered. 15 min checks continued for safety.  R: Pt remains safe.

## 2014-06-16 NOTE — [facility] Group Notes (Signed)
[facility] L[facility] Dis[facility] Planning Group Note   06/16/2014 10:38 AM  Participation Quality:  DID NOT ATTEND-pt in room sleeping.   Smart, American FinancialHeather LCSWA

## 2014-06-16 NOTE — Progress Notes (Signed)
Patient ID: Patty Valdez, female   DOB: 1986-10-17, 28 y.o.   MRN: [id number]  D: Patient pleasant on approach this am. Complains of anxiety this am in which she received vistaril prn. Gives depression "5" on scale but says that it has improved since admission. Gives "0" to feelings of hopelessness. Reports feeling drowsy this am but slept good during the night. No withdrawals from alcohol or drugs. Currently denies any SI at present.  A: Staff will monitor on q 15 minute checks, follow treatment plan, and give meds as ordered. R: Cooperative on the unit. No immediate issues.

## 2014-06-16 NOTE — [facility] Group Notes (Signed)
[facility] LCSW Group Therapy  06/16/2014 3:05 PM  Type of Therapy:  Group Therapy  Participation Level:  Minimal  Participation Quality:  Drowsy  Affect:  Flat  Cognitive:  Oriented  Insight:  Limited  Engagement in Therapy:  Limited  Modes of Intervention:  Confrontation, Discussion, Education, Exploration, Problem-solving, Rapport Building, Socialization and Support  Summary of Progress/Problems: Emotion Regulation: This group focused on both positive and negative emotion identification and allowed group members to process ways to identify feelings, regulate negative emotions, and find healthy ways to manage internal/external emotions. Group members were asked to reflect on a time when their reaction to an emotion led to a negative outcome and explored how alternative responses using emotion regulation would have benefited them. Group members were also asked to discuss a time when emotion regulation was utilized when a negative emotion was experienced. Herbert SetaHeather was drowsy during today's therapy group. She did not actively participate in group discussion unless directly asked a questions by CSW. Herbert SetaHeather shows some progress in the group setting AEB her ability to listen as others in the group shared. Herbert SetaHeather could not identify an emotion that she struggles with and demonstrates limited insight into her suicide attempt and emotional regulation problems.    Smart, Asiah LCSWA  06/16/2014, 3:05 PM

## 2014-06-16 NOTE — Progress Notes (Signed)
Pt did not attend NA group this evening.  

## 2014-06-16 NOTE — Progress Notes (Signed)
[facility] MD Progress Note  06/16/2014 12:01 PM Patty Valdez  MRN:  [id number] Subjective:  Pt seen and chart reviewed. Pt denies SI, HI, and AVH, contracts for safety. Pt reports that she had trouble walking shortly after her overdose but that she is feeling well now with no known side effects. Pt reports that she "made a very stupid decision" and that she "has to live for her children and husband".   HPI: Patty Valdez is a 28 year old female who was admitted to the [facility] after overdosing on 41 tablets of 200 mg Tegretol in a suicide attempt. Patient endorsed multiple stressors including unable to find a job due to prison record, living with mother, and being unable to visit her two children. The patient was recently started on Paxil by her Primary Care Provider about one month ago. Patient states during her psychiatric admission assessment "I took a bunch of pills. I am on probation. My mother nags me. I have to live with her. I've been very depressed. I did not think that anyone cared about me. I took my husband's medication. But I really did not want to die. I told my husband what I did before the medicine started kicking in. I did a very stupid thing. I tried to take it back. I really want to go home. I think the Paxil was making me worse. I took Celexa when I was in prison. I remember that I was not really depressed at all. That medication seemed to work good for me. I realize now that many people care about me. I would not try to hurt myself again." Patient is cooperative with her admission assessment. She appears severely depressed throughout assessment. Patient seems to be minimizing her suicide attempt by attempting to justify how it was not serious. However, she admits to being severely affected by the medication on the way to the hospital stating "I was so drowsy. I had to use a bedpan and was a mess." The patient remains at risk for self harm due to the presence of  multiple stressors.    Diagnosis:   DSM5: Depressive Disorders:  Major Depressive Disorder - Severe (296.23) Total Time spent with patient: 25 minutes  Axis I: Generalized Anxiety Disorder and Major Depression, Recurrent severe Axis II: Deferred Axis III:  Past Medical History  Diagnosis Date  . Gallstones   . Nausea   . Abdominal pain   . Vaginal bleeding   . Anemia 10 yrs ago  . Anxiety   . Genital herpes     no current meds for  . Complication of anesthesia 04-08-2009    low blood pressure after epidural, had to take iv meds for  . Migraine   . Obesity    Axis IV: other psychosocial or environmental problems and problems related to social environment Axis V: 41-50 serious symptoms  ADL's:  Intact  Sleep: Good  Appetite:  Good  Suicidal Ideation:  Denies Homicidal Ideation:  Denies AEB (as evidenced by):  Psychiatric Specialty Exam: Physical Exam  Review of Systems  Constitutional: Negative.   HENT: Negative.   Eyes: Negative.   Respiratory: Negative.   Cardiovascular: Negative.   Gastrointestinal: Negative.   Genitourinary: Negative.   Musculoskeletal: Negative.   Skin: Negative.   Neurological: Negative.   Endo/Heme/Allergies: Negative.   Psychiatric/Behavioral: Positive for depression. Negative for suicidal ideas. The patient is nervous/anxious.     Blood pressure 121/77, pulse 61, temperature 98.5 F (36.9  C), temperature source Oral, resp. rate 16, height 5\' 2"  (1.575 m), weight 82.101 kg (181 lb), last menstrual period 06/14/2014.Body mass index is 33.1 kg/(m^2).  General Appearance: Casual  Eye Contact::  Good  Speech:  Clear and Coherent  Volume:  Normal  Mood:  Anxious and Depressed  Affect:  Appropriate and Congruent  Thought Process:  Coherent and Goal Directed  Orientation:  Full (Time, Place, and Person)  Thought Content:  WDL  Suicidal Thoughts:  No  Homicidal Thoughts:  No  Memory:  Immediate;   Fair Recent;   Fair Remote;   Fair   Judgement:  Fair  Insight:  Fair  Psychomotor Activity:  Normal  Concentration:  Good  Recall:  Fair  Fund of Knowledge:Good  Language: Good  Akathisia:  No  Handed:    AIMS (if indicated):     Assets:  Communication Skills Desire for Improvement Resilience  Sleep:  Number of Hours: 6   Musculoskeletal: Strength & Muscle Tone: within normal limits Gait & Station: normal Patient leans: N/A  Current Medications: Current Facility-Administered Medications  Medication Dose Route Frequency Provider Last Rate Last Dose  . alum & mag hydroxide-simeth (MAALOX/MYLANTA) 200-200-20 MG/5ML suspension 30 mL  30 mL Oral Q4H PRN Kerry Hough, PA-C      . citalopram (CELEXA) tablet 10 mg  10 mg Oral Daily Fransisca Kaufmann, NP   10 mg at 06/16/14 0810  . hydrOXYzine (ATARAX/VISTARIL) tablet 25 mg  25 mg Oral Q6H PRN Kerry Hough, PA-C   25 mg at 06/16/14 0813  . magnesium hydroxide (MILK OF MAGNESIA) suspension 30 mL  30 mL Oral Daily PRN Kerry Hough, PA-C      . naproxen (NAPROSYN) tablet 500 mg  500 mg Oral BID WC Kerry Hough, PA-C   500 mg at 06/15/14 1724  . pantoprazole (PROTONIX) EC tablet 20 mg  20 mg Oral Daily Kerry Hough, PA-C   20 mg at 06/16/14 0810  . traZODone (DESYREL) tablet 50 mg  50 mg Oral QHS,MR X 1 Kerry Hough, PA-C   50 mg at 06/15/14 2129  . valACYclovir (VALTREX) tablet 500 mg  500 mg Oral BID Kerry Hough, PA-C   500 mg at 06/16/14 1610    Lab Results:  Results for orders placed during the hospital encounter of 06/11/14 (from the past 48 hour(s))  CARBAMAZEPINE LEVEL, TOTAL     Status: None   Collection Time    06/14/14 12:08 PM      Result Value Ref Range   Carbamazepine Lvl 9.4  4.0 - 12.0 ug/mL   Comment: Performed at [facility]  CARBAMAZEPINE LEVEL, TOTAL     Status: None   Collection Time    06/14/14  6:22 PM      Result Value Ref Range   Carbamazepine Lvl 7.0  4.0 - 12.0 ug/mL   Comment: Performed at [facility]     Physical Findings: AIMS: Facial and Oral Movements Muscles of Facial Expression: None, normal Lips and Perioral Area: None, normal Jaw: None, normal Tongue: None, normal,Extremity Movements Upper (arms, wrists, hands, fingers): None, normal Lower (legs, knees, ankles, toes): None, normal, Trunk Movements Neck, shoulders, hips: None, normal, Overall Severity Severity of abnormal movements (highest score from questions above): None, normal Incapacitation due to abnormal movements: None, normal Patient's awareness of abnormal movements (rate only patient's report): No Awareness, Dental Status Current problems with teeth and/or dentures?: No Does patient usually wear dentures?:  No  CIWA:  CIWA-Ar Total: 3 COWS:     Treatment Plan Summary: Daily contact with patient to assess and evaluate symptoms and progress in treatment Medication management  Plan: Review of chart, vital signs, medications, and notes.  1-Individual and group therapy  2-Medication management for depression and anxiety: Medications reviewed with the patient and she stated no untoward effects, unchanged. 3-Coping skills for depression, anxiety  4-Continue crisis stabilization and management  5-Address health issues--monitoring vital signs, stable  6-Treatment plan in progress to prevent relapse of depression and anxiety  Medical Decision Making Problem Points:  Established problem, stable/improving (1), Review of last therapy session (1) and Review of psycho-social stressors (1) Data Points:  Order Aims Assessment (2) Review or order medicine tests (1) Review and summation of old records (2)  I certify that inpatient services furnished can reasonably be expected to improve the patient's condition.   Beau FannyWithrow, John C, FNP-BC 06/16/2014, 12:01 PM

## 2014-06-17 LAB — HEPATIC FUNCTION PANEL
ALT: 85 U/L — ABNORMAL HIGH (ref 0–35)
AST: 46 U/L — ABNORMAL HIGH (ref 0–37)
Albumin: 3.7 g/dL (ref 3.5–5.2)
Alkaline Phosphatase: 118 U/L — ABNORMAL HIGH (ref 39–117)
Bilirubin, Direct: <0.2 mg/dL (ref 0.0–0.3)
Total Bilirubin: <0.2 mg/dL — ABNORMAL LOW (ref 0.3–1.2)
Total Protein: 7.7 g/dL (ref 6.0–8.3)

## 2014-06-17 MED ORDER — HYDROXYZINE HCL 25 MG PO TABS: ORAL_TABLET | ORAL | Status: DC

## 2014-06-17 MED ORDER — HYDROXYZINE HCL 25 MG PO TABS
25.0000 mg | ORAL_TABLET | Freq: Three times a day (TID) | ORAL | Status: DC | PRN
  Filled 2014-06-17 (×2): qty 1

## 2014-06-17 MED ORDER — HYDROXYZINE HCL 25 MG PO TABS
25.0000 mg | ORAL_TABLET | Freq: Three times a day (TID) | ORAL | Status: DC | PRN
  Filled 2014-06-17: qty 1

## 2014-06-17 MED ORDER — VALACYCLOVIR HCL 500 MG PO TABS: 500.0000 mg | ORAL_TABLET | Freq: Two times a day (BID) | ORAL | Status: DC

## 2014-06-17 MED ORDER — CITALOPRAM HYDROBROMIDE 10 MG PO TABS: 10.0000 mg | ORAL_TABLET | Freq: Every day | ORAL | Status: DC

## 2014-06-17 MED ORDER — PANTOPRAZOLE SODIUM 20 MG PO TBEC
20.0000 mg | DELAYED_RELEASE_TABLET | Freq: Every day | ORAL | Status: DC | PRN
Start: 2014-06-17 — End: 2014-12-22

## 2014-06-17 MED ORDER — TRAZODONE HCL 50 MG PO TABS: 50.0000 mg | ORAL_TABLET | Freq: Every evening | ORAL | Status: DC | PRN

## 2014-06-17 MED ORDER — PRAVASTATIN SODIUM 20 MG PO TABS
20.0000 mg | ORAL_TABLET | Freq: Every day | ORAL | Status: DC
Start: 2014-06-17 — End: 2014-12-22
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
## 2014-06-17 NOTE — Progress Notes (Signed)
Patient ID: Renold GentaHeather E Valdez, female   DOB: 11-18-1986, 28 y.o.   MRN: [id number] She has been discharged home and was picked up by a friend She voiced understanding of  Discharge instruction and of follow up plan. She denies thoughts of SI. All belongings were taken home with her. She stated "This  Is one of the happiest days of my life".

## 2014-06-17 NOTE — Discharge Summary (Signed)
Physician Discharge Summary Note  Patient:  Patty GentaHeather E Valdez is an 28 y.o., female MRN:  [id number] DOB:  Jun 12, 1986 Patient phone:  [phone] (home)  Patient address:   [address] [place] [address],  Total Time spent with patient: Greater than 30 minutes  Date of Admission:  06/14/2014 Date of Discharge: 06/17/14  Reason for Admission: Mood stabilization  Discharge Diagnoses: Active Problems:   MDD (major depressive disorder), recurrent episode, severe   Psychiatric Specialty Exam: Physical Exam  Psychiatric: Her speech is normal and behavior is normal. Judgment and thought content normal. Her mood appears not anxious. Her affect is not angry, not blunt, not labile and not inappropriate. Cognition and memory are normal. She does not exhibit a depressed mood.    Review of Systems  Constitutional: Negative.   HENT: Negative.   Eyes: Negative.   Respiratory: Negative.   Cardiovascular: Negative.   Gastrointestinal: Negative.   Genitourinary: Negative.   Musculoskeletal: Negative.   Skin: Negative.   Neurological: Negative.   Endo/Heme/Allergies: Negative.   Psychiatric/Behavioral: Positive for depression (Stable). Negative for suicidal ideas, hallucinations, memory loss and substance abuse. The patient has insomnia (Stable). The patient is not nervous/anxious.     Blood pressure 105/77, pulse 78, temperature 98.3 F (36.8 C), temperature source Oral, resp. rate 18, height 5\' 2"  (1.575 m), weight 82.101 kg (181 lb), last menstrual period 06/14/2014.Body mass index is 33.1 kg/(m^2).   General Appearance: Well Groomed   Patent attorneyye Contact:: Good   Speech: Normal Rate   Volume: Normal   Mood: reports improved mood   Affect: Appropriate   Thought Process: Goal Directed and Linear   Orientation: Full (Time, Place, and Person)   Thought Content: no hallucinations, no delusions   Suicidal Thoughts: No- denies any suicidal or homicidal ideations   Homicidal Thoughts: No    Memory: NA   Judgement: Fair   Insight: Fair   Psychomotor Activity: Normal   Concentration: Good   Recall: Good   Fund of Knowledge:Fair   Language: Good   Akathisia: NA   Handed: Right   AIMS (if indicated):   Assets: Communication Skills  Desire for Improvement  Resilience   Sleep: Number of Hours: 6.75    Past Psychiatric History: Diagnosis: MDD (major depressive disorder), recurrent episode, severe  Hospitalizations: [facility] adult unit  [facility]  Substance Abuse Care: NA  Self-Mutilation: NA  Suicidal Attempts: "Yes, hx of by OD"  Violent Behaviors: NA   Musculoskeletal: Strength & Muscle Tone: within normal limits Gait & Station: normal Patient leans: N/A  DSM5: Schizophrenia Disorders:  NA Obsessive-Compulsive Disorders:  NA Trauma-Stressor Disorders:  NA Substance/Addictive Disorders:  Nicotine addiction Depressive Disorders:  MDD (major depressive disorder), recurrent episode, severe  Axis Diagnosis:  AXIS I:  MDD (major depressive disorder), recurrent episode, severe AXIS II:  Deferred AXIS III:   Past Medical History  Diagnosis Date  . Gallstones   . Nausea   . Abdominal pain   . Vaginal bleeding   . Anemia 10 yrs ago  . Anxiety   . Genital herpes     no current meds for  . Complication of anesthesia 04-08-2009    low blood pressure after epidural, had to take iv meds for  . Migraine   . Obesity    AXIS IV:  economic problems, housing problems, occupational problems and problems related to legal system/crime AXIS V:  63  Level of Care:  OP  Hospital Course:  Patty Valdez  is a 28 year old female who was admitted to the [facility] after overdosing on 41 tablets of 200 mg Tegretol in a suicide attempt. Patient endorsed multiple stressors including unable to find a job due to prison record, living with mother, and being unable to visit her two children. The patient was recently started on Paxil  by her Primary Care Provider about one month ago. Patient states during her psychiatric admission assessment "I took a bunch of pills. I am on probation.  Patty Valdez was admitted to the hospital for mood stabilization treatment. She was battling mood instability induced by both familial, legal and financial stressors. She attempted suicide by overdose. And while a patient in this hospital, Patty Valdez was medicated and discharged on Citalopram 10 mg daily for depression, Hydroxyzine 25 mg three times daily as needed for anxiety and Trazodone 50 mg Q bedtime for sleep. She was resumed on her pertinent home medications for her other pre-existing medical conditions. She tolerated her treatment regimen without any adverse effects and or reactions.  Patty Valdez was also enrolled and participated in the group counseling sessions being offered and held on this unit. She learned coping skills. She responded well to her treatment regimen and has presented with a stable mood. This is also evidenced by her reports of improved mood and absence of suicidal ideations. She is currently being discharged to continue psychiatric treatment at the [facility] here in [place], [place]. She is provided with all the necessary information required to make this appointment without problems.   Upon discharge, she adamantly denies any SIHI, AVH, delusional thoughts and or paranoia.She received from the [facility] pharmcy, a 14 days worth, supply samples of her [facility] dicharge medications. She left [facility] with all belongings in no distress. Transportation per husband.   Consults:  psychiatry  Significant Diagnostic Studies:  labs: CBC with diff, CMP, UDS, toxicology tests, U/A  Discharge Vitals:   Blood pressure 105/77, pulse 78, temperature 98.3 F (36.8 C), temperature source Oral, resp. rate 18, height 5\' 2"  (1.575 m), weight 82.101 kg (181 lb), last menstrual period 06/14/2014. Body mass index is 33.1 kg/(m^2). Lab Results:    Results for orders placed during the hospital encounter of 06/14/14 (from the past 72 hour(s))  HEPATIC FUNCTION PANEL     Status: Abnormal   Collection Time    06/17/14  6:25 AM      Result Value Ref Range   Total Protein 7.7  6.0 - 8.3 g/dL   Albumin 3.7  3.5 - 5.2 g/dL   AST 46 (*) 0 - 37 U/L   ALT 85 (*) 0 - 35 U/L   Alkaline Phosphatase 118 (*) 39 - 117 U/L   Total Bilirubin <0.2 (*) 0.3 - 1.2 mg/dL   Bilirubin, Direct <4.0  0.0 - 0.3 mg/dL   Indirect Bilirubin NOT CALCULATED  0.3 - 0.9 mg/dL   Comment: Performed at [facility]    Physical Findings: AIMS: Facial and Oral Movements Muscles of Facial Expression: None, normal Lips and Perioral Area: None, normal Jaw: None, normal Tongue: None, normal,Extremity Movements Upper (arms, wrists, hands, fingers): None, normal Lower (legs, knees, ankles, toes): None, normal, Trunk Movements Neck, shoulders, hips: None, normal, Overall Severity Severity of abnormal movements (highest score from questions above): None, normal Incapacitation due to abnormal movements: None, normal Patient's awareness of abnormal movements (rate only patient's report): No Awareness, Dental Status Current problems with teeth and/or dentures?: No Does patient usually  wear dentures?: No  CIWA:  CIWA-Ar Total: 3 COWS:     Psychiatric Specialty Exam: See Psychiatric Specialty Exam and Suicide Risk Assessment completed by Attending Physician prior to discharge.  Discharge destination:  Home  Is patient on multiple antipsychotic therapies at discharge:  No   Has Patient had three or more failed trials of antipsychotic monotherapy by history:  No  Recommended Plan for Multiple Antipsychotic Therapies: NA     Medication List    STOP taking these medications       clonazePAM 1 MG tablet  Commonly known as:  KLONOPIN     fenofibrate 48 MG tablet  Commonly known as:  TRICOR     PARoxetine 10 MG tablet  Commonly known as:   PAXIL      TAKE these medications     Indication   citalopram 10 MG tablet  Commonly known as:  CELEXA  Take 1 tablet (10 mg total) by mouth daily. For depression   Indication:  Depression     hydrOXYzine 25 MG tablet  Commonly known as:  ATARAX/VISTARIL  Take 1 tablet (25 mg) three times daily as needed: For anxiety   Indication:  Anxiety associated with Organic Disease, Tension     pantoprazole 20 MG tablet  Commonly known as:  PROTONIX  Take 1 tablet (20 mg total) by mouth daily as needed. Heart burn   Indication:  Gastroesophageal Reflux Disease     pravastatin 20 MG tablet  Commonly known as:  PRAVACHOL  Take 1 tablet (20 mg total) by mouth daily. For high cholesterol   Indication:  Inherited Heterozygous Hypercholesterolemia     traZODone 50 MG tablet  Commonly known as:  DESYREL  Take 1 tablet (50 mg total) by mouth at bedtime and may repeat dose one time if needed. For sleep   Indication:  Trouble Sleeping     valACYclovir 500 MG tablet  Commonly known as:  VALTREX  Take 1 tablet (500 mg total) by mouth 2 (two) times daily. For herpes infection   Indication:  Herpes infection       Follow-up Information   Follow up with [facility]. (Call office on day of discharge to make provider aware of your discharge and schedule next IOP group visit. )    Contact information:   [address] [place], [place] [address] Phone: [phone] Fax: [phone]      Follow up with [facility]. (Walk in for hospital follow-up on day after discharge. Walk in hours from 8am-5pm Monday, Tuesday, Thursday, and Friday. )    Contact information:   ATTN: Dr. Mayford Knife [address] [place], [place] [address] Phone: [phone] Fax: [phone]     Follow-up recommendations: Activity:  As tolerated Diet: As recommended by your primary care doctor. Keep all scheduled follow-up appointments as recommended.   Comments: Take all your medications as  prescribed by your mental healthcare provider. Report any adverse effects and or reactions from your medicines to your outpatient provider promptly. Patient is instructed and cautioned to not engage in alcohol and or illegal drug use while on prescription medicines. In the event of worsening symptoms, patient is instructed to call the crisis hotline, 911 and or go to the nearest ED for appropriate evaluation and treatment of symptoms. Follow-up with your primary care provider for your other medical issues, concerns and or health care needs.    Total Discharge Time:  Greater than 30 minutes.  Signed: Sanjuana Kava, PMHNP 06/17/2014,  11:04 AM  Patient seen, Suicide Assessment Completed.  Disposition Plan Reviewed

## 2014-06-17 NOTE — [facility] Group Notes (Signed)
0900 nursing orientation group   The focus of this group is to educate the patient on the purpose and policies of crisis stabilization and provide a format to answer questions about their admission.  The group details unit policies and expectations of patients while admitted.  Pt did not attend she was with the case manager.

## 2014-06-17 NOTE — Progress Notes (Signed)
[facility] :  Will you be returning to the same living situation after discharge: Yes,  home At discharge, do you have transportation home?:Yes,  husband Do you have the ability to pay for your medications:Yes,  [facility] Medicaid  Release of information consent forms completed and submitted to Medical Records by CSW.  Patient to Follow up at: Follow-up Information   [facility]. (Call office on day of discharge to make provider aware of your discharge and schedule next IOP group visit. )    Contact information:   [address] [place], [place] [address] Phone: [phone] Fax: [phone]      Follow [facility] with [facility]. (Walk in for hospital follow-up on day after discharge. Walk in hours from 8am-5pm Monday, Tuesday, Thursday, and Friday. )    Contact information:   ATTN: Dr. Mayford KnifeWilliams [address] [place], [place] [address] Phone: [phone] Fax: [phone]      Patient denies SI/HI:   Yes,  during group/self report.     Safety Planning and Suicide Prevention discussed:  Yes,  SPE completed with pt's husband. SPI pamphlet provided to pt and she was encouraged to share information with support network, ask questions, and talk about any concerns relating to SPE.  Smart, Daisa LCSWA  06/17/2014, 9:23 AM

## 2014-06-18 ENCOUNTER — Ambulatory Visit: Payer: Medicaid Other | Admitting: Gastroenterology

## 2014-06-18 NOTE — ED Provider Notes (Signed)
Medical screening examination/treatment/procedure(s) were performed by non-physician practitioner and as supervising physician I was immediately available for consultation/collaboration.   EKG Interpretation   Date/Time:  Friday June 11 2014 16:56:44 EDT Ventricular Rate:  79 PR Interval:  123 QRS Duration: 87 QT Interval:  337 QTC Calculation: 386 R Axis:   74 Text Interpretation:  Sinus rhythm ED PHYSICIAN INTERPRETATION AVAILABLE  IN [facility] Confirmed by TEST, Record ([id number]) on 06/13/2014 9:08:38  AM        Rolland PorterMark Hortencia Martire, MD 06/18/14 [phone]

## 2014-06-21 NOTE — Progress Notes (Signed)
Patient Discharge Instructions:  After Visit Summary (AVS):   Faxed to:  06/21/14 Discharge Summary Note:   Faxed to:  06/21/14 Psychiatric Admission Assessment Note:   Faxed to:  06/21/14 Suicide Risk Assessment - Discharge Assessment:   Faxed to:  06/21/14 Faxed/Sent to the Next Level Care provider:  06/21/14 [facility]axed to [facility] @ [phone] [facility]axed to [facility] @ [phone]  Jerelene ReddenSheena E Orleans, 06/21/2014, 3:38 PM

## 2014-08-05 ENCOUNTER — Encounter (HOSPITAL_COMMUNITY): Payer: Self-pay | Admitting: Emergency Medicine

## 2014-08-05 ENCOUNTER — Emergency Department (HOSPITAL_COMMUNITY)
Admission: EM | Admit: 2014-08-05 | Discharge: 2014-08-05 | Disposition: A | Discharge status: Home / Self Care | Payer: Medicaid Other | Attending: Emergency Medicine | Admitting: Emergency Medicine

## 2014-08-05 ENCOUNTER — Emergency Department (HOSPITAL_COMMUNITY): Payer: Medicaid Other

## 2014-08-05 DIAGNOSIS — W010XXA Fall on same level from slipping, tripping and stumbling without subsequent striking against object, initial encounter: Secondary | ICD-10-CM | POA: Diagnosis not present

## 2014-08-05 DIAGNOSIS — Z79899 Other long term (current) drug therapy: Secondary | ICD-10-CM | POA: Diagnosis not present

## 2014-08-05 DIAGNOSIS — Y9289 Other specified places as the place of occurrence of the external cause: Secondary | ICD-10-CM | POA: Diagnosis not present

## 2014-08-05 DIAGNOSIS — Z8719 Personal history of other diseases of the digestive system: Secondary | ICD-10-CM | POA: Diagnosis not present

## 2014-08-05 DIAGNOSIS — Z3202 Encounter for pregnancy test, result negative: Secondary | ICD-10-CM | POA: Insufficient documentation

## 2014-08-05 DIAGNOSIS — S99929A Unspecified injury of unspecified foot, initial encounter: Secondary | ICD-10-CM | POA: Diagnosis not present

## 2014-08-05 DIAGNOSIS — Z8742 Personal history of other diseases of the female genital tract: Secondary | ICD-10-CM | POA: Insufficient documentation

## 2014-08-05 DIAGNOSIS — F411 Generalized anxiety disorder: Secondary | ICD-10-CM | POA: Insufficient documentation

## 2014-08-05 DIAGNOSIS — E669 Obesity, unspecified: Secondary | ICD-10-CM | POA: Diagnosis not present

## 2014-08-05 DIAGNOSIS — S99919A Unspecified injury of unspecified ankle, initial encounter: Principal | ICD-10-CM

## 2014-08-05 DIAGNOSIS — Y9389 Activity, other specified: Secondary | ICD-10-CM | POA: Insufficient documentation

## 2014-08-05 DIAGNOSIS — S8990XA Unspecified injury of unspecified lower leg, initial encounter: Secondary | ICD-10-CM | POA: Diagnosis not present

## 2014-08-05 DIAGNOSIS — F17200 Nicotine dependence, unspecified, uncomplicated: Secondary | ICD-10-CM | POA: Diagnosis not present

## 2014-08-05 DIAGNOSIS — Z862 Personal history of diseases of the blood and blood-forming organs and certain disorders involving the immune mechanism: Secondary | ICD-10-CM | POA: Insufficient documentation

## 2014-08-05 DIAGNOSIS — Z8669 Personal history of other diseases of the nervous system and sense organs: Secondary | ICD-10-CM | POA: Insufficient documentation

## 2014-08-05 DIAGNOSIS — S99921A Unspecified injury of right foot, initial encounter: Secondary | ICD-10-CM

## 2014-08-05 LAB — POC URINE PREG, ED: PREG TEST UR: NEGATIVE

## 2014-08-05 MED ORDER — TRAMADOL HCL 50 MG PO TABS: 50.0000 mg | ORAL_TABLET | Freq: Four times a day (QID) | ORAL | Status: DC | PRN

## 2014-08-05 NOTE — ED Provider Notes (Signed)
CSN: [id number]     Arrival date & time 08/05/14  8657 History   First MD Initiated Contact with Patient 08/05/14 [phone]     Chief Complaint  Patient presents with  . Foot Injury     (Consider location/radiation/quality/duration/timing/severity/associated sxs/prior Treatment) Patient is a 28 y.o. female presenting with foot injury. The history is provided by the patient and medical records.  Foot Injury  This is a 28 y.o. F with PMH significant for anemia, migraine headaches, presenting to the ED for right foot injury. Patient states last night she tripped on stairs and injured her right foot, unknown mechanism.  No head injury or loss of consciousness. Patient states now she has severe pain to the top of her right foot. She also notes some paresthesias throughout her right lower leg and foot. She has been unable to bear weight since fall. No intervention tried prior to arrival.  Past Medical History  Diagnosis Date  . Gallstones   . Nausea   . Abdominal pain   . Vaginal bleeding   . Anemia 10 yrs ago  . Anxiety   . Genital herpes     no current meds for  . Complication of anesthesia 04-08-2009    low blood pressure after epidural, had to take iv meds for  . Migraine   . Obesity    Past Surgical History  Procedure Laterality Date  . No past surgeries    . Cholecystectomy     Family History  Problem Relation Age of Onset  . Cancer Maternal Grandfather     colon   History  Substance Use Topics  . Smoking status: Current Every Day Smoker -- 0.50 packs/day for 10 years    Types: Cigarettes  . Smokeless tobacco: Never Used  . Alcohol Use: No     Comment: last drink was 1 yr ago   OB History   Grav Para Term Preterm Abortions TAB SAB Ect Mult Living                 Review of Systems  Musculoskeletal: Positive for arthralgias.  All other systems reviewed and are negative.     Allergies  Review of patient's allergies indicates no known allergies.  Home Medications     Prior to Admission medications   Medication Sig Start Date End Date Taking? Authorizing Provider  citalopram (CELEXA) 10 MG tablet Take 1 tablet (10 mg total) by mouth daily. For depression 06/17/14   Sanjuana Kava, NP  hydrOXYzine (ATARAX/VISTARIL) 25 MG tablet Take 1 tablet (25 mg) three times daily as needed: For anxiety 06/17/14   Sanjuana Kava, NP  pantoprazole (PROTONIX) 20 MG tablet Take 1 tablet (20 mg total) by mouth daily as needed. Heart burn 06/17/14 06/17/15  Sanjuana Kava, NP  pravastatin (PRAVACHOL) 20 MG tablet Take 1 tablet (20 mg total) by mouth daily. For high cholesterol 06/17/14   Sanjuana Kava, NP  traZODone (DESYREL) 50 MG tablet Take 1 tablet (50 mg total) by mouth at bedtime and may repeat dose one time if needed. For sleep 06/17/14   Sanjuana Kava, NP  valACYclovir (VALTREX) 500 MG tablet Take 1 tablet (500 mg total) by mouth 2 (two) times daily. For herpes infection 06/17/14   Sanjuana Kava, NP   BP 120/75  Pulse 81  Temp(Src) 97.9 F (36.6 C) (Oral)  Resp 18  SpO2 100%  LMP 07/05/2014  Physical Exam  Nursing note and vitals reviewed. Constitutional: She is oriented  to person, place, and time. She appears well-developed and well-nourished. No distress.  HENT:  Head: Normocephalic and atraumatic.  Mouth/Throat: Oropharynx is clear and moist.  Eyes: Conjunctivae and EOM are normal. Pupils are equal, round, and reactive to light.  Neck: Normal range of motion. Neck supple.  Cardiovascular: Normal rate, regular rhythm and normal heart sounds.   Pulmonary/Chest: Effort normal and breath sounds normal. No respiratory distress. She has no wheezes.  Musculoskeletal: Normal range of motion.       Right foot: She exhibits tenderness, bony tenderness and swelling.       Feet:  Right foot with mild swelling along proximal dorsal aspect; small amount of bruising noted; full ROM of ankle and all toes without difficulty; foot remains NVI  Neurological: She is alert and  oriented to person, place, and time.  Skin: Skin is warm and dry. She is not diaphoretic.  Psychiatric: She has a normal mood and affect.    ED Course  Procedures (including critical care time) Labs Review Labs Reviewed  POC URINE PREG, ED    Imaging Review Dg Foot Complete Right  08/05/2014   CLINICAL DATA:  Status post fall striking the dorsum of the right foot now with generalized right foot pain extending into the lower leg.  EXAM: RIGHT FOOT COMPLETE - 3+ VIEW  COMPARISON:  None.  FINDINGS: The bones of the right foot are adequately mineralized. There is no acute fracture nor dislocation. The interphalangeal, metatarsophalangeal, and tarsometatarsal joints are normal. The bones of the hindfoot are intact. There is minimal soft tissue swelling over the dorsum of the midfoot.  IMPRESSION: There is no acute bony abnormality of the right foot.   Electronically Signed   By: David  [place]   On: 08/05/2014 10:05     EKG Interpretation None      MDM   Final diagnoses:  Foot injury, right, initial encounter   Imaging negative for acute fx.  Foot remains NVI.  Will treat as ankle sprain-- patient placed in ASO splint with crutches. Encouraged RICE routine at home. Orthopedic FU in 1 week if no improvement.  Discussed plan with patient, he/she acknowledged understanding and agreed with plan of care.  Return precautions given for new or worsening symptoms.  Garlon Hatchet, PA-C 08/05/14 1115

## 2014-08-05 NOTE — Discharge Instructions (Signed)
Take the prescribed medication as directed.  Progress back to weight bearing as tolerated. Follow-up with orthopedics if no improvement in 1 week. Return to the ED for new or worsening symptoms.

## 2014-08-05 NOTE — ED Notes (Signed)
Per pt, states she tripped on some stairs and injured right foot-states pain shooting up right leg and making it numb

## 2014-08-06 NOTE — ED Provider Notes (Signed)
Medical screening examination/treatment/procedure(s) were performed by non-physician practitioner and as supervising physician I was immediately available for consultation/collaboration.     Temekia Caskey E Victorious Cosio, MD 08/06/14 0736 

## 2014-09-12 ENCOUNTER — Inpatient Hospital Stay (HOSPITAL_COMMUNITY)
Admission: AD | Admit: 2014-09-12 | Discharge: 2014-09-12 | Disposition: A | Discharge status: Home / Self Care | Payer: Medicaid Other | Source: Ambulatory Visit | Attending: Obstetrics & Gynecology | Admitting: Obstetrics & Gynecology

## 2014-09-12 ENCOUNTER — Encounter (HOSPITAL_COMMUNITY): Payer: Self-pay | Admitting: *Deleted

## 2014-09-12 ENCOUNTER — Emergency Department (HOSPITAL_COMMUNITY)
Admission: EM | Admit: 2014-09-12 | Discharge: 2014-09-12 | Discharge status: Against Medical Advice | Payer: Medicaid Other

## 2014-09-12 DIAGNOSIS — Z3201 Encounter for pregnancy test, result positive: Secondary | ICD-10-CM | POA: Diagnosis present

## 2014-09-12 LAB — POCT PREGNANCY, URINE: Preg Test, Ur: POSITIVE — AB

## 2014-09-12 NOTE — MAU Note (Signed)
Pt presents to MAU for confirmation of pregnancy Denies any vaginal bleeding of pain

## 2014-09-12 NOTE — MAU Provider Note (Signed)
S: 28 y.o. G1P0 @[redacted]w[redacted]d  by LMP presents to MAU for pregnancy confirmation.  She denies vaginal bleeding or abdominal pain.  O: BP 124/76  Pulse 92  Resp 18  LMP 08/08/2014 Physical Examination: General appearance - alert, well appearing, and in no distress, oriented to person, place, and time and acyanotic, in no respiratory distress  A: Positive pregnancy test  P: Verification letter and list of providers given  Sharen CounterLisa Leftwich-Kirby Certified Nurse-Midwife

## 2014-09-12 NOTE — MAU Provider Note (Signed)
Attestation of Attending Supervision of Advanced Practitioner (PA/CNM/NP): Evaluation and management procedures were performed by the Advanced Practitioner under my supervision and collaboration.  I have reviewed the Advanced Practitioner's note and chart, and I agree with the management and plan.  Saniyah Mondesir, MD, FACOG Attending Obstetrician & Gynecologist Faculty Practice, [facility]   

## 2014-09-20 ENCOUNTER — Encounter (HOSPITAL_COMMUNITY): Payer: Self-pay | Admitting: *Deleted

## 2014-09-21 LAB — OB RESULTS CONSOLE ABO/RH: RH Type: POSITIVE

## 2014-09-21 LAB — OB RESULTS CONSOLE GC/CHLAMYDIA
Chlamydia: NEGATIVE
Gonorrhea: NEGATIVE

## 2014-09-21 LAB — OB RESULTS CONSOLE RUBELLA ANTIBODY, IGM: RUBELLA: IMMUNE

## 2014-09-21 LAB — OB RESULTS CONSOLE ANTIBODY SCREEN: Antibody Screen: NEGATIVE

## 2014-09-21 LAB — OB RESULTS CONSOLE HEPATITIS B SURFACE ANTIGEN: Hepatitis B Surface Ag: NEGATIVE

## 2014-09-21 LAB — OB RESULTS CONSOLE RPR: RPR: NONREACTIVE

## 2014-09-21 LAB — OB RESULTS CONSOLE HIV ANTIBODY (ROUTINE TESTING): HIV: NONREACTIVE

## 2014-10-21 ENCOUNTER — Encounter: Payer: Medicaid Other | Admitting: Obstetrics

## 2014-11-22 ENCOUNTER — Emergency Department (HOSPITAL_COMMUNITY): Payer: No Typology Code available for payment source

## 2014-11-22 ENCOUNTER — Encounter (HOSPITAL_COMMUNITY): Payer: Self-pay | Admitting: Emergency Medicine

## 2014-11-22 ENCOUNTER — Emergency Department (HOSPITAL_COMMUNITY)
Admission: EM | Admit: 2014-11-22 | Discharge: 2014-11-22 | Disposition: A | Discharge status: Home / Self Care | Payer: No Typology Code available for payment source | Attending: Emergency Medicine | Admitting: Emergency Medicine

## 2014-11-22 DIAGNOSIS — Z79899 Other long term (current) drug therapy: Secondary | ICD-10-CM | POA: Insufficient documentation

## 2014-11-22 DIAGNOSIS — Y998 Other external cause status: Secondary | ICD-10-CM | POA: Insufficient documentation

## 2014-11-22 DIAGNOSIS — Y9389 Activity, other specified: Secondary | ICD-10-CM | POA: Diagnosis not present

## 2014-11-22 DIAGNOSIS — M545 Low back pain, unspecified: Secondary | ICD-10-CM

## 2014-11-22 DIAGNOSIS — K219 Gastro-esophageal reflux disease without esophagitis: Secondary | ICD-10-CM | POA: Diagnosis not present

## 2014-11-22 DIAGNOSIS — Z8742 Personal history of other diseases of the female genital tract: Secondary | ICD-10-CM | POA: Diagnosis not present

## 2014-11-22 DIAGNOSIS — Z3A16 16 weeks gestation of pregnancy: Secondary | ICD-10-CM | POA: Diagnosis not present

## 2014-11-22 DIAGNOSIS — Y92410 Unspecified street and highway as the place of occurrence of the external cause: Secondary | ICD-10-CM | POA: Insufficient documentation

## 2014-11-22 DIAGNOSIS — F17210 Nicotine dependence, cigarettes, uncomplicated: Secondary | ICD-10-CM | POA: Insufficient documentation

## 2014-11-22 DIAGNOSIS — Z8619 Personal history of other infectious and parasitic diseases: Secondary | ICD-10-CM | POA: Insufficient documentation

## 2014-11-22 DIAGNOSIS — S3992XA Unspecified injury of lower back, initial encounter: Secondary | ICD-10-CM | POA: Diagnosis not present

## 2014-11-22 DIAGNOSIS — S29092A Other injury of muscle and tendon of back wall of thorax, initial encounter: Secondary | ICD-10-CM | POA: Diagnosis not present

## 2014-11-22 DIAGNOSIS — F419 Anxiety disorder, unspecified: Secondary | ICD-10-CM | POA: Diagnosis not present

## 2014-11-22 DIAGNOSIS — Z3490 Encounter for supervision of normal pregnancy, unspecified, unspecified trimester: Secondary | ICD-10-CM

## 2014-11-22 DIAGNOSIS — E669 Obesity, unspecified: Secondary | ICD-10-CM | POA: Insufficient documentation

## 2014-11-22 DIAGNOSIS — Z8679 Personal history of other diseases of the circulatory system: Secondary | ICD-10-CM | POA: Diagnosis not present

## 2014-11-22 DIAGNOSIS — O9A212 Injury, poisoning and certain other consequences of external causes complicating pregnancy, second trimester: Secondary | ICD-10-CM | POA: Diagnosis present

## 2014-11-22 DIAGNOSIS — S3991XA Unspecified injury of abdomen, initial encounter: Secondary | ICD-10-CM | POA: Insufficient documentation

## 2014-11-22 DIAGNOSIS — R1030 Lower abdominal pain, unspecified: Secondary | ICD-10-CM

## 2014-11-22 DIAGNOSIS — O99332 Smoking (tobacco) complicating pregnancy, second trimester: Secondary | ICD-10-CM | POA: Insufficient documentation

## 2014-11-22 LAB — BASIC METABOLIC PANEL
ANION GAP: 7 (ref 5–15)
BUN: 6 mg/dL (ref 6–23)
CHLORIDE: 104 meq/L (ref 96–112)
CO2: 23 mmol/L (ref 19–32)
Calcium: 8.9 mg/dL (ref 8.4–10.5)
Creatinine, Ser: 0.43 mg/dL — ABNORMAL LOW (ref 0.50–1.10)
GFR calc Af Amer: >90 mL/min (ref >90)
GFR calc non Af Amer: >90 mL/min (ref >90)
Glucose, Bld: 82 mg/dL (ref 70–99)
Potassium: 3.6 mmol/L (ref 3.5–5.1)
Sodium: 134 mmol/L — ABNORMAL LOW (ref 135–145)

## 2014-11-22 LAB — URINALYSIS, ROUTINE W REFLEX MICROSCOPIC
Bilirubin Urine: NEGATIVE
GLUCOSE, UA: NEGATIVE mg/dL
HGB URINE DIPSTICK: NEGATIVE
Ketones, ur: NEGATIVE mg/dL
Leukocytes, UA: NEGATIVE
Nitrite: NEGATIVE
Protein, ur: NEGATIVE mg/dL
Specific Gravity, Urine: 1.017 (ref 1.005–1.030)
Urobilinogen, UA: 0.2 mg/dL (ref 0.0–1.0)
pH: 6 (ref 5.0–8.0)

## 2014-11-22 LAB — CBC
HCT: 35.4 % — ABNORMAL LOW (ref 36.0–46.0)
Hemoglobin: 11.9 g/dL — ABNORMAL LOW (ref 12.0–15.0)
MCH: 31.1 pg (ref 26.0–34.0)
MCHC: 33.6 g/dL (ref 30.0–36.0)
MCV: 92.4 fL (ref 78.0–100.0)
PLATELETS: 248 10*3/uL (ref 150–400)
RBC: 3.83 MIL/uL — AB (ref 3.87–5.11)
RDW: 12.7 % (ref 11.5–15.5)
WBC: 10.3 10*3/uL (ref 4.0–10.5)

## 2014-11-22 LAB — WET PREP, GENITAL
Trich, Wet Prep: NONE SEEN
Yeast Wet Prep HPF POC: NONE SEEN

## 2014-11-22 LAB — ABO/RH: ABO/RH(D): O POS
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
## 2014-11-22 NOTE — Discharge Instructions (Signed)
1. Medications: Tylenol, usual home medications 2. Treatment: rest, drink plenty of fluids, heat and back exercises.   3. Follow Up: Please followup with your OB/GYN in 2 days for discussion of your diagnoses and further evaluation after today's visit; if you do not have a primary care doctor use the resource guide provided to find one;  Return to the ER for worsening back pain, difficulty walking, loss of bowel or bladder control or other concerning symptoms; return to the [facility] for worsening or persistent abd pain or vaginal bleeding.   Back Exercises Back exercises help treat and prevent back injuries. The goal of back exercises is to increase the strength of your abdominal and back muscles and the flexibility of your back. These exercises should be started when you no longer have back pain. Back exercises include:  Pelvic Tilt. Lie on your back with your knees bent. Tilt your pelvis until the lower part of your back is against the floor. Hold this position 5 to 10 sec and repeat 5 to 10 times.  Knee to Chest. Pull first 1 knee up against your chest and hold for 20 to 30 seconds, repeat this with the other knee, and then both knees. This may be done with the other leg straight or bent, whichever feels better.  Sit-Ups or Curl-Ups. Bend your knees 90 degrees. Start with tilting your pelvis, and do a partial, slow sit-up, lifting your trunk only 30 to 45 degrees off the floor. Take at least 2 to 3 seconds for each sit-up. Do not do sit-ups with your knees out straight. If partial sit-ups are difficult, simply do the above but with only tightening your abdominal muscles and holding it as directed.  Hip-Lift. Lie on your back with your knees flexed 90 degrees. Push down with your feet and shoulders as you raise your hips a couple inches off the floor; hold for 10 seconds, repeat 5 to 10 times.  Back arches. Lie on your stomach, propping yourself up on bent elbows. Slowly press on your  hands, causing an arch in your low back. Repeat 3 to 5 times. Any initial stiffness and discomfort should lessen with repetition over time.  Shoulder-Lifts. Lie face down with arms beside your body. Keep hips and torso pressed to floor as you slowly lift your head and shoulders off the floor. Do not overdo your exercises, especially in the beginning. Exercises may cause you some mild back discomfort which lasts for a few minutes; however, if the pain is more severe, or lasts for more than 15 minutes, do not continue exercises until you see your caregiver. Improvement with exercise therapy for back problems is slow.  See your caregivers for assistance with developing a proper back exercise program. Document Released: 12/13/2004 Document Revised: 01/28/2012 Document Reviewed: 09/06/2011 [facility] Patient Information 2015 [place], [place]. This information is not intended to replace advice given to you by your health care provider. Make sure you discuss any questions you have with your health care provider.

## 2014-11-24 LAB — GC/CHLAMYDIA PROBE AMP
CT Probe RNA: NEGATIVE
GC Probe RNA: NEGATIVE

## 2014-12-22 ENCOUNTER — Inpatient Hospital Stay (HOSPITAL_COMMUNITY)
Admission: AD | Admit: 2014-12-22 | Discharge: 2014-12-22 | Disposition: A | Discharge status: Home / Self Care | Payer: Medicaid Other | Source: Ambulatory Visit | Attending: Obstetrics | Admitting: Obstetrics

## 2014-12-22 ENCOUNTER — Encounter (HOSPITAL_COMMUNITY): Payer: Self-pay | Admitting: *Deleted

## 2014-12-22 DIAGNOSIS — Z3A19 19 weeks gestation of pregnancy: Secondary | ICD-10-CM | POA: Diagnosis not present

## 2014-12-22 DIAGNOSIS — O368120 Decreased fetal movements, second trimester, not applicable or unspecified: Secondary | ICD-10-CM | POA: Insufficient documentation

## 2014-12-22 DIAGNOSIS — O9989 Other specified diseases and conditions complicating pregnancy, childbirth and the puerperium: Secondary | ICD-10-CM

## 2014-12-22 DIAGNOSIS — R102 Pelvic and perineal pain: Secondary | ICD-10-CM

## 2014-12-22 DIAGNOSIS — N949 Unspecified condition associated with female genital organs and menstrual cycle: Secondary | ICD-10-CM

## 2014-12-22 DIAGNOSIS — O99332 Smoking (tobacco) complicating pregnancy, second trimester: Secondary | ICD-10-CM | POA: Diagnosis not present

## 2014-12-22 DIAGNOSIS — F17210 Nicotine dependence, cigarettes, uncomplicated: Secondary | ICD-10-CM | POA: Diagnosis not present

## 2014-12-22 LAB — URINALYSIS, ROUTINE W REFLEX MICROSCOPIC
Bilirubin Urine: NEGATIVE
Glucose, UA: NEGATIVE mg/dL
Hgb urine dipstick: NEGATIVE
Ketones, ur: NEGATIVE mg/dL
Leukocytes, UA: NEGATIVE
Nitrite: NEGATIVE
PROTEIN: NEGATIVE mg/dL
SPECIFIC GRAVITY, URINE: 1.02 (ref 1.005–1.030)
Urobilinogen, UA: 0.2 mg/dL (ref 0.0–1.0)
pH: 6.5 (ref 5.0–8.0)

## 2014-12-22 LAB — RAPID URINE DRUG SCREEN, HOSP PERFORMED
AMPHETAMINES: NOT DETECTED
BARBITURATES: NOT DETECTED
Benzodiazepines: POSITIVE — AB
Cocaine: NOT DETECTED
Opiates: NOT DETECTED
Tetrahydrocannabinol: POSITIVE — AB

## 2014-12-22 NOTE — Discharge Instructions (Signed)
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
## 2014-12-22 NOTE — MAU Provider Note (Signed)
History     CSN: [id number]  Arrival date and time: 12/22/14 1337   First Provider Initiated Contact with Patient 12/22/14 1539      Chief Complaint  Patient presents with  . Pelvic Pain  . Decreased Fetal Movement   HPI   [name] is 29 y.o. female G3P2002 at 5368w3d who presents with pelvic pain and decreased fetal movement. She has been seen by Dr. Gaynell FaceMarshall for prenatal care, however feels like she wants to change practices.   The pain seems to worsen when she gets up off the couch and when she gets out of the bed. The pain is worse when she is changing positions in the bed or walking.   Denies bleeding or leaking of fluid + fetal movements occasionally. Does not feel movements regularly. She was seen in January and had an US that showed a previa. She is scheduled for an anatomy US this Friday with Dr. Gaynell FaceMarshall. She has not had any vaginal bleeding in this pregnancy.   OB History    Gravida Para Term Preterm AB TAB SAB Ectopic Multiple Living   3 2 2       2       Past Medical History  Diagnosis Date  . Gallstones   . Nausea   . Abdominal pain   . Vaginal bleeding   . Anemia 10 yrs ago  . Anxiety   . Genital herpes     no current meds for  . Complication of anesthesia 04-08-2009    low blood pressure after epidural, had to take iv meds for  . Migraine   . Obesity     Past Surgical History  Procedure Laterality Date  . No past surgeries    . Cholecystectomy      Family History  Problem Relation Age of Onset  . Cancer Maternal Grandfather     colon    History  Substance Use Topics  . Smoking status: Current Every Day Smoker -- 0.50 packs/day for 10 years    Types: Cigarettes  . Smokeless tobacco: Never Used  . Alcohol Use: No     Comment: last drink was 1 yr ago    Allergies: No Known Allergies  Prescriptions prior to admission  Medication Sig Dispense Refill Last Dose  . ibuprofen (ADVIL,MOTRIN) 200 MG tablet Take 200-800 mg by mouth  every 6 (six) hours as needed for fever or moderate pain.   Past Week at Unknown time  . Prenatal Vit-Fe Fumarate-FA (MULTIVITAMIN-PRENATAL) 27-0.8 MG TABS tablet Take 1 tablet by mouth daily at 12 noon.   12/22/2014 at Unknown time  . hydrOXYzine (ATARAX/VISTARIL) 25 MG tablet Take 1 tablet (25 mg) three times daily as needed: For anxiety (Patient not taking: Reported on 12/22/2014) 30 tablet 0 unknown  . pantoprazole (PROTONIX) 20 MG tablet Take 1 tablet (20 mg total) by mouth daily as needed. Heart burn (Patient not taking: Reported on 12/22/2014)   Past Week at Unknown time  . pravastatin (PRAVACHOL) 20 MG tablet Take 1 tablet (20 mg total) by mouth daily. For high cholesterol (Patient not taking: Reported on 12/22/2014)   Past Month at Unknown time  . traMADol (ULTRAM) 50 MG tablet Take 1 tablet (50 mg total) by mouth every 6 (six) hours as needed. (Patient not taking: Reported on 12/22/2014) 15 tablet 0   . traZODone (DESYREL) 50 MG tablet Take 1 tablet (50 mg total) by mouth at bedtime and may repeat dose one time if needed.  For sleep (Patient not taking: Reported on 12/22/2014) 60 tablet 0 Past Month at Unknown time  . valACYclovir (VALTREX) 500 MG tablet Take 1 tablet (500 mg total) by mouth 2 (two) times daily. For herpes infection (Patient not taking: Reported on 12/22/2014)   unknown   Results for orders placed or performed during the hospital encounter of 12/22/14 (from the past 48 hour(s))  Urinalysis, Routine w reflex microscopic     Status: None   Collection Time: 12/22/14  1:54 PM  Result Value Ref Range   Color, Urine YELLOW YELLOW   APPearance CLEAR CLEAR   Specific Gravity, Urine 1.020 1.005 - 1.030   pH 6.5 5.0 - 8.0   Glucose, UA NEGATIVE NEGATIVE mg/dL   Hgb urine dipstick NEGATIVE NEGATIVE   Bilirubin Urine NEGATIVE NEGATIVE   Ketones, ur NEGATIVE NEGATIVE mg/dL   Protein, ur NEGATIVE NEGATIVE mg/dL   Urobilinogen, UA 0.2 0.0 - 1.0 mg/dL   Nitrite NEGATIVE NEGATIVE   Leukocytes, UA  NEGATIVE NEGATIVE    Comment: MICROSCOPIC NOT DONE ON URINES WITH NEGATIVE PROTEIN, BLOOD, LEUKOCYTES, NITRITE, OR GLUCOSE <1000 mg/dL.    Review of Systems  Constitutional: Negative for fever and chills.  Gastrointestinal: Positive for abdominal pain (Bilateral lower abdominal pain; worse with movement ). Negative for diarrhea and constipation.  Genitourinary: Negative for dysuria, urgency, frequency and hematuria.       Denies vaginal bleeding    Physical Exam   Blood pressure 120/82, pulse 77, temperature 97.7 F (36.5 C), temperature source Temporal, resp. rate 18, height 5' 0.5" (1.537 m), weight 85.276 kg (188 lb), last menstrual period 08/08/2014, SpO2 100 %.  Physical Exam  Constitutional: She is oriented to person, place, and time. She appears well-developed and well-nourished.  Non-toxic appearance. She does not have a sickly appearance. She does not appear ill. No distress.  HENT:  Head: Normocephalic.  Eyes: Pupils are equal, round, and reactive to light.  Neck: Neck supple.  Respiratory: Effort normal.  Musculoskeletal: Normal range of motion.  Neurological: She is alert and oriented to person, place, and time.  Skin: Skin is warm. She is not diaphoretic.  Psychiatric: Her behavior is normal.    MAU Course  Procedures  None  MDM + fetal heart tones  Drug screen pending   The patient denies any type of exam. Denies STI testing. Denies speculum exam.   Assessment and Plan   A:  1. Round ligament pain    P:  Discharge home in stable condition  Keep scheduled appointment with Dr. Gaynell Face Return to MAU as needed, if symptoms worsen Pregnancy support belt recommended  Iona Hansen Rasch, NP 12/22/2014 5:07 PM

## 2014-12-28 ENCOUNTER — Other Ambulatory Visit (HOSPITAL_COMMUNITY): Payer: Self-pay | Admitting: Obstetrics

## 2014-12-28 DIAGNOSIS — O283 Abnormal ultrasonic finding on antenatal screening of mother: Secondary | ICD-10-CM

## 2014-12-28 DIAGNOSIS — Z3689 Encounter for other specified antenatal screening: Secondary | ICD-10-CM

## 2014-12-28 DIAGNOSIS — O4403 Placenta previa specified as without hemorrhage, third trimester: Secondary | ICD-10-CM

## 2015-01-05 ENCOUNTER — Encounter (HOSPITAL_COMMUNITY): Payer: Self-pay

## 2015-01-05 ENCOUNTER — Other Ambulatory Visit (HOSPITAL_COMMUNITY): Payer: Self-pay | Admitting: Maternal and Fetal Medicine

## 2015-01-05 ENCOUNTER — Other Ambulatory Visit (HOSPITAL_COMMUNITY): Payer: Self-pay | Admitting: Obstetrics

## 2015-01-05 ENCOUNTER — Other Ambulatory Visit (HOSPITAL_COMMUNITY): Payer: Self-pay

## 2015-01-05 ENCOUNTER — Ambulatory Visit (HOSPITAL_COMMUNITY)
Admission: RE | Admit: 2015-01-05 | Discharge: 2015-01-05 | Disposition: A | Discharge status: Home / Self Care | Payer: Medicaid Other | Source: Ambulatory Visit | Attending: Obstetrics | Admitting: Obstetrics

## 2015-01-05 DIAGNOSIS — O4402 Placenta previa specified as without hemorrhage, second trimester: Secondary | ICD-10-CM | POA: Diagnosis not present

## 2015-01-05 DIAGNOSIS — Z36 Encounter for antenatal screening of mother: Secondary | ICD-10-CM | POA: Diagnosis not present

## 2015-01-05 DIAGNOSIS — Z3A21 21 weeks gestation of pregnancy: Secondary | ICD-10-CM | POA: Diagnosis not present

## 2015-01-05 DIAGNOSIS — Z3689 Encounter for other specified antenatal screening: Secondary | ICD-10-CM

## 2015-01-05 DIAGNOSIS — O4403 Placenta previa specified as without hemorrhage, third trimester: Secondary | ICD-10-CM

## 2015-01-05 DIAGNOSIS — O283 Abnormal ultrasonic finding on antenatal screening of mother: Secondary | ICD-10-CM

## 2015-01-05 DIAGNOSIS — O4440 Low lying placenta NOS or without hemorrhage, unspecified trimester: Secondary | ICD-10-CM | POA: Insufficient documentation

## 2015-01-24 ENCOUNTER — Encounter (HOSPITAL_COMMUNITY): Payer: Self-pay | Admitting: Emergency Medicine

## 2015-01-24 ENCOUNTER — Emergency Department (HOSPITAL_COMMUNITY)
Admission: EM | Admit: 2015-01-24 | Discharge: 2015-01-24 | Disposition: A | Discharge status: Home / Self Care | Payer: Medicaid Other | Attending: Emergency Medicine | Admitting: Emergency Medicine

## 2015-01-24 DIAGNOSIS — R05 Cough: Secondary | ICD-10-CM | POA: Insufficient documentation

## 2015-01-24 DIAGNOSIS — Z87448 Personal history of other diseases of urinary system: Secondary | ICD-10-CM | POA: Diagnosis not present

## 2015-01-24 DIAGNOSIS — Z79899 Other long term (current) drug therapy: Secondary | ICD-10-CM | POA: Diagnosis not present

## 2015-01-24 DIAGNOSIS — Z8619 Personal history of other infectious and parasitic diseases: Secondary | ICD-10-CM | POA: Insufficient documentation

## 2015-01-24 DIAGNOSIS — R0602 Shortness of breath: Secondary | ICD-10-CM | POA: Diagnosis not present

## 2015-01-24 DIAGNOSIS — Z8679 Personal history of other diseases of the circulatory system: Secondary | ICD-10-CM | POA: Insufficient documentation

## 2015-01-24 DIAGNOSIS — F419 Anxiety disorder, unspecified: Secondary | ICD-10-CM | POA: Insufficient documentation

## 2015-01-24 DIAGNOSIS — R079 Chest pain, unspecified: Secondary | ICD-10-CM | POA: Diagnosis not present

## 2015-01-24 DIAGNOSIS — Z8719 Personal history of other diseases of the digestive system: Secondary | ICD-10-CM | POA: Diagnosis not present

## 2015-01-24 DIAGNOSIS — Z720 Tobacco use: Secondary | ICD-10-CM | POA: Insufficient documentation

## 2015-01-24 DIAGNOSIS — Z862 Personal history of diseases of the blood and blood-forming organs and certain disorders involving the immune mechanism: Secondary | ICD-10-CM | POA: Insufficient documentation

## 2015-01-24 DIAGNOSIS — E669 Obesity, unspecified: Secondary | ICD-10-CM | POA: Insufficient documentation

## 2015-01-24 DIAGNOSIS — R002 Palpitations: Secondary | ICD-10-CM | POA: Diagnosis not present

## 2015-01-24 LAB — I-STAT CHEM 8, ED
BUN: 4 mg/dL — AB (ref 6–23)
CALCIUM ION: 1.11 mmol/L — AB (ref 1.12–1.23)
CREATININE: 0.5 mg/dL (ref 0.50–1.10)
Chloride: 104 mmol/L (ref 96–112)
GLUCOSE: 90 mg/dL (ref 70–99)
HCT: 34 % — ABNORMAL LOW (ref 36.0–46.0)
HEMOGLOBIN: 11.6 g/dL — AB (ref 12.0–15.0)
Potassium: 3.7 mmol/L (ref 3.5–5.1)
Sodium: 136 mmol/L (ref 135–145)
TCO2: 18 mmol/L (ref 0–100)

## 2015-01-24 NOTE — Discharge Instructions (Signed)
Please follow-up with your doctor for further evaluation of your condition. You may need to have your thyroid level checked. Her electrolytes and your hemoglobin today appears normal. The EKG is normal as well. Best of luck with your pregnancy.     Emergency Department Resource Guide 1) Find a Doctor and Pay Out of Pocket Although you won't have to find out who is covered by your insurance plan, it is a good idea to ask around and get recommendations. You will then need to call the office and see if the doctor you have chosen will accept you as a new patient and what types of options they offer for patients who are self-pay. Some doctors offer discounts or will set up payment plans for their patients who do not have insurance, but you will need to ask so you aren't surprised when you get to your appointment.  2) Contact Your Local Health Department Not all health departments have doctors that can see patients for sick visits, but many do, so it is worth a call to see if yours does. If you don't know where your local health department is, you can check in your phone book. The CDC also has a tool to help you locate your state's health department, and many state websites also have listings of all of their local health departments.  3) Find a Walk-in Clinic If your illness is not likely to be very severe or complicated, you may want to try a walk in clinic. These are popping up all over the country in pharmacies, drugstores, and shopping centers. They're usually staffed by nurse practitioners or physician assistants that have been trained to treat common illnesses and complaints. They're usually fairly quick and inexpensive. However, if you have serious medical issues or chronic medical problems, these are probably not your best option.  No Primary Care Doctor: - Call Health Connect at  [phone] - they can help you locate a primary care doctor that  accepts your insurance, provides certain services,  etc. - Physician Referral Service- [phone]  Chronic Pain Problems: Organization         Address  Phone   Notes  [facility]  [phone] Patients need to be referred by their primary care doctor.   Medication Assistance: Organization         Address  Phone   Notes  [facility] Medication [facility] [address] [place], [place] [address] [phone] --Must be a resident of [facility] [place] -- Must have NO insurance coverage whatsoever (no Medicaid/ Medicare, etc.) -- The pt. MUST have a primary care doctor that directs their care regularly and follows them in the community   MedAssist  [phone]   [employer]  [phone]    Agencies that provide inexpensive medical care: Organization         Address  Phone   Notes  [facility]  [phone]   [facility]    [phone]   [facility] [address] [place], [place] [address] [phone]   [facility] [address], [place] [phone]   [facility]anned [facility]    [phone]   [facility]    [phone]   [facility]  [address], [place] Phone:  [phone], Fax:  [phone] Hours of Operation:  9 am - 6 pm, M-F.  Also accepts  Medicaid/Medicare and self-pay.  [facility] for [address], [address], [place] Phone: [phone], Fax: [phone]. Hours of Operation:  8:30 am - 5:30 pm, M-F.  Also accepts Medicaid and self-pay.  [facility] [place] [address], [place] Phone: [phone]   [place], [place], [place] [phone], Ext. [phone] Mondays & Thursdays: 7-9 AM.  First 15 patients are seen on a first come, first serve basis.    [place] Providers:  Organization         Address  Phone   Notes  [facility] [address], [place] [phone] Also accepts self-pay patients.  [facility] [address], [place]  [phone]   [place], [address], [place] [phone]   [facility] [address], [place] [phone]   Lucianne Lei [address], [place]   [phone] Only accepts [place] Access [place] patients after they have their name applied to their card.   Self-Pay (no insurance) in [facility]:  Organization         Address  Phone   Notes  Sickle Cell Patients, [facility] [place] [phone]   [facility] [place] [phone]   [facility] [place]  [place], [place], [place] [phone]   [facility]/Dr. Osei-Bonsu  [address], [place] or [address], [place] [phone] Phone number for both [place] and [place] locations is the same.  Urgent Medical and [facility] [address], [place] [phone]   [facility] [address], [place] or [address] [phone]   [facility] [address], [place] [phone], phone; [phone], fax Sees patients 1st and 3rd Saturday of every month.  Must not qualify for public or private insurance (i.e. Medicaid, Medicare, [place] Health Choice, Veterans' Benefits)  Household income should be no more than 200% of the poverty level The clinic cannot treat you if you are pregnant or think you are pregnant  Sexually transmitted diseases are not treated at the clinic.    Dental Care: Organization         Address  Phone  Notes  [facility] [place] [phone] Accepts children up to  age 83 who are enrolled in [place] or [place]; pregnant women with a Medicaid card; and children who have applied for Medicaid or [place] Health Choice, but were declined, whose parents can pay a reduced fee at time of service.  [facility] Department of [facility]  [address], [place] [phone] Accepts children up to age 81 who are enrolled in [place] or [place]; pregnant women with a Medicaid card; and children who have applied for Medicaid or [place] Health Choice, but were declined, whose parents can pay a reduced fee at time of service.  [facility] PRO[facility]  [place] [phone] Patients are seen by appointment only. Walk-ins are not accepted. J[place]re will see patients 27 years of age and older. Monday - Tuesday (8am-5pm) Most  Wednesdays (8:30-5pm) $30 per visit, cash only  [facility] Adult Dental Access PROGRAM  [address], [facility] [phone] Patients are seen by appointment only. Walk-ins are not accepted. Natalia will see patients 31 years of age and older. One Wednesday Evening (Monthly: Volunteer Based).  $30 per visit, cash only  [place]  [phone] for adults; Children under age 20, call Graduate Pediatric Dentistry at [phone]. Children aged 59-14, please call [phone] to request a pediatric application.  Dental services are provided in all areas of dental care including fillings, crowns and bridges, complete and partial dentures, implants, gum treatment, root canals, and extractions. Preventive care is also provided. Treatment is provided to both adults and children. Patients are selected via a lottery and there is often a waiting list.   [facility] [address], [place]  [phone] [email or web address]   [facility] [address] [place], [place] [phone], Ext. [phone] Second and Fourth Thursday of  each month, opens at 6:30 AM; Clinic ends at 9 AM.  Patients are seen on a first-come first-served basis, and a limited number are seen during each clinic.   [facility]  [address] Hillard Danker [place], [place] [phone]   Eligibility Requirements You must have lived in [place], [place], or [place] counties for at least the last three months.   You cannot be eligible for state or federal sponsored [employer], including [employer], [place], or [employer].   You generally cannot be eligible for healthcare insurance through your employer.    How to apply: Eligibility screenings are held every Tuesday and Wednesday afternoon from 1:00 pm until 4:00 pm. You do not need an appointment for the interview!  [facility] [address] [place], [place]   [place]  [place] Department  [place]  [phone]    Behavioral Health Resources in the Community: Intensive Outpatient Programs Organization         Address  Phone  Notes  [facility] [place]. [address], [place], [place] [phone]   [facility] [address], [place], [place]   ADS: Alcohol & Drug Svcs [address] [place], [place]   [place] [address]  [place], [place] or [phone]   Substance Abuse Resources Organization         Address  Phone  Notes  Alcohol and Drug Services  [phone]   [place]  [phone]   [facility] [place]   Chinita Pester  [phone]   Residential & Outpatient Substance Abuse Program  [phone]   Psychological Services Organization         Address  Phone  Notes  [facility] [place]  [place]  [phone]   [place] [address],  [place] or [phone]    Mobile Crisis Teams Organization         Address  Phone  Notes  [facility], Mobile Crisis Care Unit  [phone]   [facility]  [address] [place], [place]   [place] Levels [address] [place] [phone]    Self-Help/Support Groups Organization         Address  Phone  Notes  [facility]ental Health Assoc[facility] of [place] - variety of support groups  [place] Call for more information  Narcotics Anonymous (NA), [facility] [address], [employer] [place]  2 meetings at this location   Special educational needs teacher         Address  Phone  Notes  [facility] Residential Treatment [place],    [place]  [phone]   [facility]  [address], [place] [id number], [place], [place]   [place] [address] [place], [place] [phone] Admissions: 8am-3pm M-F  Incentives Substance [place] [address],    [place], [place] [phone]   [facility] [address] [place], [place], [place]   [facility] [address],  [place], [place]   [facility] Programs - Intensive Outpatient [address], [place], [place]   [facility] ([place].,  [place], [place] [phone] or [phone]   [facility] Services (RTS) [address], [place], [place] Accepts Medicaid  [facility] [place] [address],  [place] [phone] Substance Abuse/Addiction Treatment   [facility] Organization         Address  Phone  Notes  [facility]  [phone]   Domenic Schwab, PhD [address] Arlis Porta [place], [place]   [phone] or [phone]   [place], [place] [phone]     [facility] [address] [place], [place] [phone] Insurance/Medicaid/sponsorship through [facility] and Families [address], [address] [place]                                    [place], [place] [phone] [place] [address], [place] [phone]    Dr. Adele Schilder  [phone]   [facility] Dept. 1) [address], [place] 2) [place] 3)  [place] [address], [place] [phone]  [phone]   [place] [phone] or [phone] (After Hours)

## 2015-01-24 NOTE — ED Provider Notes (Signed)
CSN: [id number]     Arrival date & time 01/24/15  1214 History  This chart was scribed for non-physician practitioner [name], PA-C working with Doug SouSam Jacubowitz, MD by Littie Deedsichard Sun, ED Scribe. This patient was seen in room WTR6/WTR6 and the patient's care was started at 12:33 PM.      Chief Complaint  Patient presents with  . Anxiety   The history is provided by the patient. No language interpreter was used.   HPI Comments: Patty Valdez is a 29 y.o. female with a hx of anxiety who presents to the Emergency Department complaining of increased anxiety that started about an hour ago while waiting in line at a store. Patient notes that her eyes started crossing and could not hold them open and her chest felt heavy. She also reports difficulty breathing, diaphoresis, near-syncope and palpitations. She also feels as if she needs to get fresh air. Patient has had a history of anxiety for a long time and has been on medications which provide relief. She has had several episodes this past month, about 3-4 times per week. Her episodes are usually resolved by sitting down; however, she notes that sitting down made her symptoms worsen today. Her current episode has been going on for about an hour, and it has improved since onset. She does state that she has been feeling more stress than usual due to personal issues, but her episodes are not usually triggered by stress. Patient is currently 6 months pregnant.  Patient also reports having a cough.  Past Medical History  Diagnosis Date  . Gallstones   . Nausea   . Abdominal pain   . Vaginal bleeding   . Anemia 10 yrs ago  . Anxiety   . Genital herpes     no current meds for  . Complication of anesthesia 04-08-2009    low blood pressure after epidural, had to take iv meds for  . Migraine   . Obesity    Past Surgical History  Procedure Laterality Date  . No past surgeries    . Cholecystectomy     Family History  Problem Relation Age of Onset  .  Cancer Maternal Grandfather     colon   History  Substance Use Topics  . Smoking status: Current Every Day Smoker -- 0.50 packs/day for 10 years    Types: Cigarettes  . Smokeless tobacco: Never Used  . Alcohol Use: No     Comment: last drink was 1 yr ago   OB History    Gravida Para Term Preterm AB TAB SAB Ectopic Multiple Living   3 2 2       2      Review of Systems  Constitutional: Positive for diaphoresis.  Respiratory: Positive for cough and shortness of breath.   Cardiovascular: Positive for chest pain and palpitations.  Psychiatric/Behavioral: The patient is nervous/anxious.       Allergies  Review of patient's allergies indicates no known allergies.  Home Medications   Prior to Admission medications   Medication Sig Start Date End Date Taking? Authorizing Provider  Prenatal Vit-Fe Fumarate-FA (MULTIVITAMIN-PRENATAL) 27-0.8 MG TABS tablet Take 1 tablet by mouth daily at 12 noon.    Historical Provider, MD  valACYclovir (VALTREX) 500 MG tablet Take 1 tablet (500 mg total) by mouth 2 (two) times daily. For herpes infection Patient not taking: Reported on 12/22/2014 06/17/14   Sanjuana KavaAgnes I Nwoko, NP   BP 112/62 mmHg  Pulse 99  Temp(Src) 98.1 F (36.7  C) (Oral)  Resp 18  SpO2 100%  LMP 08/08/2014 Physical Exam  Constitutional: She is oriented to person, place, and time. She appears well-developed and well-nourished. No distress.  HENT:  Head: Normocephalic and atraumatic.  Mouth/Throat: Oropharynx is clear and moist. No oropharyngeal exudate.  Eyes: Pupils are equal, round, and reactive to light.  Neck: Neck supple.  Cardiovascular: Normal rate, regular rhythm and normal heart sounds.  Exam reveals no gallop and no friction rub.   No murmur heard. Pulmonary/Chest: Effort normal and breath sounds normal. No respiratory distress. She has no wheezes. She has no rales.  Abdominal: Soft. There is no tenderness.  Musculoskeletal: She exhibits no edema.  Neurological: She is  alert and oriented to person, place, and time. No cranial nerve deficit.  Skin: Skin is warm and dry. No rash noted.  Psychiatric: Her speech is normal and behavior is normal. Judgment normal. Her mood appears anxious. Thought content is not paranoid. Cognition and memory are normal. She expresses no homicidal and no suicidal ideation.  Nursing note and vitals reviewed.   ED Course  Procedures  DIAGNOSTIC STUDIES: Oxygen Saturation is 100% on room air, normal by my interpretation.    COORDINATION OF CARE: 12:42 PM-Discussed treatment plan which includes labs with pt at bedside and pt agreed to plan.   1:43 PM Patient with recurrent episodes of what appears to be anxiety attack or heart palpitation. Longest episode was today lasting for more than hour but that has resolved. She has no significant thyroid disease, does not take any medication to triggered these episodes. She does report feeling stressed out and she is currently pregnant but denies SI or HI. She does have a PCP to follow-up. On today's exam, she is afebrile with stable normal vital sign and no signs of hypoxia. Normal orthostatic vital sign. EKG shows no acute ischemic changes or arrhythmia. Her electrolytes and her H&H are reassuring. Given her active pregnancy, I will not prescribed medication and request patient to follow-up palpation for further care including a thyroid examination. Return precautions discussed. Low suspicion for PE, aortic dissection or other acute emergent medical condition.   Labs Review Labs Reviewed  I-STAT CHEM 8, ED - Abnormal; Notable for the following:    BUN 4 (*)    Calcium, Ion 1.11 (*)    Hemoglobin 11.6 (*)    HCT 34.0 (*)    All other components within normal limits    Imaging Review No results found.   EKG Interpretation None      Date: 01/24/2015  Rate: 84  Rhythm: normal sinus rhythm  QRS Axis: normal  Intervals: normal  ST/T Wave abnormalities: normal  Conduction  Disutrbances: none  Narrative Interpretation:   Old EKG Reviewed: none for comparison     MDM   Final diagnoses:  Anxiety    BP 112/62 mmHg  Pulse 99  Temp(Src) 98.1 F (36.7 C) (Oral)  Resp 18  SpO2 100%  LMP 08/08/2014   I personally performed the services described in this documentation, which was scribed in my presence. The recorded information has been reviewed and is accurate.     Fayrene Helper, PA-C 01/24/15 1346  Doug Sou, MD 01/24/15 [phone]

## 2015-01-24 NOTE — ED Notes (Signed)
Per pt, states she was having some anxiety and felt her eyes crossing-cough and congestion

## 2015-02-16 ENCOUNTER — Encounter (HOSPITAL_COMMUNITY): Payer: Self-pay

## 2015-02-16 ENCOUNTER — Ambulatory Visit (HOSPITAL_COMMUNITY)
Admission: RE | Admit: 2015-02-16 | Discharge: 2015-02-16 | Disposition: A | Discharge status: Home / Self Care | Payer: Medicaid Other | Source: Ambulatory Visit | Attending: Obstetrics | Admitting: Obstetrics

## 2015-02-16 DIAGNOSIS — O4402 Placenta previa specified as without hemorrhage, second trimester: Secondary | ICD-10-CM | POA: Diagnosis not present

## 2015-02-16 DIAGNOSIS — O283 Abnormal ultrasonic finding on antenatal screening of mother: Secondary | ICD-10-CM

## 2015-02-16 DIAGNOSIS — Z3A27 27 weeks gestation of pregnancy: Secondary | ICD-10-CM | POA: Diagnosis not present

## 2015-02-16 DIAGNOSIS — O4403 Placenta previa specified as without hemorrhage, third trimester: Secondary | ICD-10-CM

## 2015-04-26 ENCOUNTER — Encounter (HOSPITAL_COMMUNITY): Payer: Self-pay

## 2015-04-26 ENCOUNTER — Inpatient Hospital Stay (HOSPITAL_COMMUNITY)
Admission: AD | Admit: 2015-04-26 | Discharge: 2015-04-26 | Disposition: A | Discharge status: Home / Self Care | Payer: Medicaid Other | Source: Ambulatory Visit | Attending: Obstetrics | Admitting: Obstetrics

## 2015-04-26 DIAGNOSIS — O9989 Other specified diseases and conditions complicating pregnancy, childbirth and the puerperium: Secondary | ICD-10-CM | POA: Diagnosis present

## 2015-04-26 DIAGNOSIS — Z3A37 37 weeks gestation of pregnancy: Secondary | ICD-10-CM | POA: Insufficient documentation

## 2015-04-26 NOTE — MAU Note (Signed)
Pt reports contractions and increased pressure.

## 2015-05-13 ENCOUNTER — Encounter (HOSPITAL_COMMUNITY): Payer: Self-pay | Admitting: *Deleted

## 2015-05-13 ENCOUNTER — Telehealth (HOSPITAL_COMMUNITY): Payer: Self-pay | Admitting: *Deleted

## 2015-05-13 LAB — OB RESULTS CONSOLE GBS: STREP GROUP B AG: POSITIVE

## 2015-05-13 NOTE — Telephone Encounter (Signed)
Preadmission screen  

## 2015-05-15 ENCOUNTER — Encounter (HOSPITAL_COMMUNITY): Payer: Self-pay

## 2015-05-15 ENCOUNTER — Encounter (HOSPITAL_COMMUNITY)
Admission: RE | Disposition: A | Discharge status: Home / Self Care | Payer: Self-pay | Source: Ambulatory Visit | Attending: Obstetrics

## 2015-05-15 ENCOUNTER — Inpatient Hospital Stay (HOSPITAL_COMMUNITY)
Admission: RE | Admit: 2015-05-15 | Discharge: 2015-05-18 | DRG: 766 | Disposition: A | Discharge status: Home / Self Care | Payer: Medicaid Other | Source: Ambulatory Visit | Attending: Obstetrics | Admitting: Obstetrics

## 2015-05-15 ENCOUNTER — Inpatient Hospital Stay (HOSPITAL_COMMUNITY): Payer: Medicaid Other

## 2015-05-15 DIAGNOSIS — O480 Post-term pregnancy: Secondary | ICD-10-CM | POA: Diagnosis present

## 2015-05-15 DIAGNOSIS — O98313 Other infections with a predominantly sexual mode of transmission complicating pregnancy, third trimester: Secondary | ICD-10-CM | POA: Diagnosis present

## 2015-05-15 DIAGNOSIS — A6000 Herpesviral infection of urogenital system, unspecified: Secondary | ICD-10-CM | POA: Diagnosis present

## 2015-05-15 DIAGNOSIS — Z3A40 40 weeks gestation of pregnancy: Secondary | ICD-10-CM | POA: Diagnosis present

## 2015-05-15 DIAGNOSIS — Z98891 History of uterine scar from previous surgery: Secondary | ICD-10-CM

## 2015-05-15 DIAGNOSIS — O9832 Other infections with a predominantly sexual mode of transmission complicating childbirth: Principal | ICD-10-CM | POA: Diagnosis present

## 2015-05-15 DIAGNOSIS — Z3490 Encounter for supervision of normal pregnancy, unspecified, unspecified trimester: Secondary | ICD-10-CM

## 2015-05-15 DIAGNOSIS — O99824 Streptococcus B carrier state complicating childbirth: Secondary | ICD-10-CM | POA: Diagnosis present

## 2015-05-15 DIAGNOSIS — O0990 Supervision of high risk pregnancy, unspecified, unspecified trimester: Secondary | ICD-10-CM

## 2015-05-15 DIAGNOSIS — B009 Herpesviral infection, unspecified: Secondary | ICD-10-CM

## 2015-05-15 LAB — CBC
HEMATOCRIT: 31.4 % — AB (ref 36.0–46.0)
Hemoglobin: 10.9 g/dL — ABNORMAL LOW (ref 12.0–15.0)
MCH: 31.4 pg (ref 26.0–34.0)
MCHC: 34.7 g/dL (ref 30.0–36.0)
MCV: 90.5 fL (ref 78.0–100.0)
PLATELETS: 263 10*3/uL (ref 150–400)
RBC: 3.47 MIL/uL — AB (ref 3.87–5.11)
RDW: 14.1 % (ref 11.5–15.5)
WBC: 9.5 10*3/uL (ref 4.0–10.5)

## 2015-05-15 LAB — TYPE AND SCREEN
ABO/RH(D): O POS
Antibody Screen: NEGATIVE

## 2015-05-15 LAB — RPR: RPR: NONREACTIVE

## 2015-05-15 SURGERY — Surgical Case
Anesthesia: Spinal

## 2015-05-15 MED ORDER — KETOROLAC TROMETHAMINE 30 MG/ML IJ SOLN: 30.0000 mg | Freq: Four times a day (QID) | INTRAMUSCULAR | Status: DC | PRN

## 2015-05-15 MED ORDER — LANOLIN HYDROUS EX OINT: 1.0000 "application " | TOPICAL_OINTMENT | CUTANEOUS | Status: DC | PRN

## 2015-05-15 MED ORDER — OXYCODONE-ACETAMINOPHEN 5-325 MG PO TABS: 2.0000 | ORAL_TABLET | ORAL | Status: DC | PRN

## 2015-05-15 MED ORDER — DIBUCAINE 1 % RE OINT
1.0000 | TOPICAL_OINTMENT | RECTAL | Status: DC | PRN
Start: 2015-05-15 — End: 2015-05-18

## 2015-05-15 MED ORDER — WITCH HAZEL-GLYCERIN EX PADS: 1.0000 "application " | MEDICATED_PAD | CUTANEOUS | Status: DC | PRN

## 2015-05-15 MED ORDER — OXYTOCIN 40 UNITS IN LACTATED RINGERS INFUSION - SIMPLE MED: 62.5000 mL/h | INTRAVENOUS | Status: DC

## 2015-05-15 MED ORDER — ONDANSETRON HCL 4 MG/2ML IJ SOLN
INTRAMUSCULAR | Status: DC | PRN
  Administered 2015-05-15: 4 mg via INTRAVENOUS

## 2015-05-15 MED ORDER — LACTATED RINGERS IV SOLN: INTRAVENOUS | Status: DC

## 2015-05-15 MED ORDER — SODIUM CHLORIDE 0.9 % IJ SOLN: 3.0000 mL | INTRAMUSCULAR | Status: DC | PRN

## 2015-05-15 MED ORDER — MIDAZOLAM HCL 2 MG/2ML IJ SOLN
INTRAMUSCULAR | Status: DC | PRN
  Administered 2015-05-15: 2 mg via INTRAVENOUS

## 2015-05-15 MED ORDER — OXYCODONE-ACETAMINOPHEN 5-325 MG PO TABS: 1.0000 | ORAL_TABLET | ORAL | Status: DC | PRN

## 2015-05-15 MED ORDER — ONDANSETRON HCL 4 MG/2ML IJ SOLN: 4.0000 mg | Freq: Three times a day (TID) | INTRAMUSCULAR | Status: DC | PRN

## 2015-05-15 MED ORDER — FENTANYL CITRATE (PF) 100 MCG/2ML IJ SOLN
INTRAMUSCULAR | Status: AC
  Filled 2015-05-15: qty 2

## 2015-05-15 MED ORDER — DIPHENHYDRAMINE HCL 25 MG PO CAPS
25.0000 mg | ORAL_CAPSULE | ORAL | Status: DC | PRN
  Filled 2015-05-15: qty 1

## 2015-05-15 MED ORDER — IBUPROFEN 600 MG PO TABS
600.0000 mg | ORAL_TABLET | Freq: Four times a day (QID) | ORAL | Status: DC
  Administered 2015-05-16 – 2015-05-18 (×9): 600 mg via ORAL
  Filled 2015-05-15 (×9): qty 1

## 2015-05-15 MED ORDER — MIDAZOLAM HCL 2 MG/2ML IJ SOLN
INTRAMUSCULAR | Status: AC
  Filled 2015-05-15: qty 2

## 2015-05-15 MED ORDER — OXYTOCIN 10 UNIT/ML IJ SOLN
INTRAMUSCULAR | Status: AC
  Filled 2015-05-15: qty 4

## 2015-05-15 MED ORDER — LACTATED RINGERS IV SOLN
INTRAVENOUS | Status: DC
  Administered 2015-05-15 (×2): via INTRAVENOUS

## 2015-05-15 MED ORDER — SCOPOLAMINE 1 MG/3DAYS TD PT72: 1.0000 | MEDICATED_PATCH | Freq: Once | TRANSDERMAL | Status: DC

## 2015-05-15 MED ORDER — ONDANSETRON HCL 4 MG/2ML IJ SOLN: 4.0000 mg | Freq: Four times a day (QID) | INTRAMUSCULAR | Status: DC | PRN

## 2015-05-15 MED ORDER — SCOPOLAMINE 1 MG/3DAYS TD PT72
MEDICATED_PATCH | TRANSDERMAL | Status: AC
  Filled 2015-05-15: qty 1

## 2015-05-15 MED ORDER — MEPERIDINE HCL 25 MG/ML IJ SOLN: 6.2500 mg | INTRAMUSCULAR | Status: DC | PRN

## 2015-05-15 MED ORDER — FENTANYL CITRATE (PF) 100 MCG/2ML IJ SOLN: 25.0000 ug | INTRAMUSCULAR | Status: DC | PRN

## 2015-05-15 MED ORDER — DEXTROSE 5 % IV SOLN
1.0000 ug/kg/h | INTRAVENOUS | Status: DC | PRN
  Filled 2015-05-15: qty 2

## 2015-05-15 MED ORDER — SIMETHICONE 80 MG PO CHEW
80.0000 mg | CHEWABLE_TABLET | ORAL | Status: DC
  Administered 2015-05-15 – 2015-05-17 (×3): 80 mg via ORAL
  Filled 2015-05-15 (×3): qty 1

## 2015-05-15 MED ORDER — DIPHENHYDRAMINE HCL 50 MG/ML IJ SOLN: 12.5000 mg | INTRAMUSCULAR | Status: DC | PRN

## 2015-05-15 MED ORDER — SCOPOLAMINE 1 MG/3DAYS TD PT72
MEDICATED_PATCH | TRANSDERMAL | Status: DC | PRN
  Administered 2015-05-15: 1 mg via TRANSDERMAL

## 2015-05-15 MED ORDER — ZOLPIDEM TARTRATE 5 MG PO TABS
5.0000 mg | ORAL_TABLET | Freq: Every evening | ORAL | Status: DC | PRN
Start: 2015-05-15 — End: 2015-05-18

## 2015-05-15 MED ORDER — MORPHINE SULFATE (PF) 0.5 MG/ML IJ SOLN
INTRAMUSCULAR | Status: DC | PRN
  Administered 2015-05-15: .1 mg via INTRATHECAL

## 2015-05-15 MED ORDER — FLEET ENEMA 7-19 GM/118ML RE ENEM: 1.0000 | ENEMA | RECTAL | Status: DC | PRN

## 2015-05-15 MED ORDER — KETOROLAC TROMETHAMINE 30 MG/ML IJ SOLN
30.0000 mg | Freq: Four times a day (QID) | INTRAMUSCULAR | Status: DC | PRN
  Administered 2015-05-15: 30 mg via INTRAMUSCULAR

## 2015-05-15 MED ORDER — MENTHOL 3 MG MT LOZG: 1.0000 | LOZENGE | OROMUCOSAL | Status: DC | PRN

## 2015-05-15 MED ORDER — KETOROLAC TROMETHAMINE 30 MG/ML IJ SOLN
INTRAMUSCULAR | Status: AC
  Administered 2015-05-15: 30 mg via INTRAVENOUS
  Filled 2015-05-15: qty 1

## 2015-05-15 MED ORDER — SENNOSIDES-DOCUSATE SODIUM 8.6-50 MG PO TABS
2.0000 | ORAL_TABLET | ORAL | Status: DC
  Administered 2015-05-15 – 2015-05-17 (×3): 2 via ORAL
  Filled 2015-05-15 (×3): qty 2

## 2015-05-15 MED ORDER — OXYTOCIN 40 UNITS IN LACTATED RINGERS INFUSION - SIMPLE MED: 62.5000 mL/h | INTRAVENOUS | Status: AC

## 2015-05-15 MED ORDER — ACETAMINOPHEN 325 MG PO TABS
650.0000 mg | ORAL_TABLET | ORAL | Status: DC | PRN
  Administered 2015-05-15: 650 mg via ORAL
  Filled 2015-05-15: qty 2

## 2015-05-15 MED ORDER — PRENATAL MULTIVITAMIN CH
1.0000 | ORAL_TABLET | Freq: Every day | ORAL | Status: DC
  Administered 2015-05-16 – 2015-05-17 (×2): 1 via ORAL
  Filled 2015-05-15 (×2): qty 1

## 2015-05-15 MED ORDER — SIMETHICONE 80 MG PO CHEW
80.0000 mg | CHEWABLE_TABLET | Freq: Three times a day (TID) | ORAL | Status: DC
  Administered 2015-05-16 – 2015-05-18 (×6): 80 mg via ORAL
  Filled 2015-05-15 (×6): qty 1

## 2015-05-15 MED ORDER — NALBUPHINE HCL 10 MG/ML IJ SOLN: 5.0000 mg | INTRAMUSCULAR | Status: DC | PRN

## 2015-05-15 MED ORDER — OXYTOCIN BOLUS FROM INFUSION: 500.0000 mL | INTRAVENOUS | Status: DC

## 2015-05-15 MED ORDER — ONDANSETRON HCL 4 MG/2ML IJ SOLN
INTRAMUSCULAR | Status: AC
  Filled 2015-05-15: qty 2

## 2015-05-15 MED ORDER — TETANUS-DIPHTH-ACELL PERTUSSIS 5-2.5-18.5 LF-MCG/0.5 IM SUSP: 0.5000 mL | Freq: Once | INTRAMUSCULAR | Status: DC

## 2015-05-15 MED ORDER — LIDOCAINE HCL (PF) 1 % IJ SOLN: 30.0000 mL | INTRAMUSCULAR | Status: DC | PRN

## 2015-05-15 MED ORDER — MORPHINE SULFATE 0.5 MG/ML IJ SOLN
INTRAMUSCULAR | Status: AC
  Filled 2015-05-15: qty 10

## 2015-05-15 MED ORDER — DIPHENHYDRAMINE HCL 25 MG PO CAPS: 25.0000 mg | ORAL_CAPSULE | Freq: Four times a day (QID) | ORAL | Status: DC | PRN

## 2015-05-15 MED ORDER — NALBUPHINE HCL 10 MG/ML IJ SOLN: 5.0000 mg | Freq: Once | INTRAMUSCULAR | Status: AC | PRN

## 2015-05-15 MED ORDER — VALACYCLOVIR HCL 500 MG PO TABS
500.0000 mg | ORAL_TABLET | Freq: Two times a day (BID) | ORAL | Status: DC
  Administered 2015-05-15 – 2015-05-17 (×5): 500 mg via ORAL
  Filled 2015-05-15 (×5): qty 1

## 2015-05-15 MED ORDER — LACTATED RINGERS IV SOLN
INTRAVENOUS | Status: DC | PRN
  Administered 2015-05-15: 14:00:00 via INTRAVENOUS

## 2015-05-15 MED ORDER — NALOXONE HCL 0.4 MG/ML IJ SOLN: 0.4000 mg | INTRAMUSCULAR | Status: DC | PRN

## 2015-05-15 MED ORDER — ACETAMINOPHEN 325 MG PO TABS
650.0000 mg | ORAL_TABLET | ORAL | Status: DC | PRN
Start: 2015-05-15 — End: 2015-05-15

## 2015-05-15 MED ORDER — CITRIC ACID-SODIUM CITRATE 334-500 MG/5ML PO SOLN
30.0000 mL | ORAL | Status: DC | PRN
  Administered 2015-05-15: 30 mL via ORAL
  Filled 2015-05-15: qty 15

## 2015-05-15 MED ORDER — FENTANYL CITRATE (PF) 100 MCG/2ML IJ SOLN
INTRAMUSCULAR | Status: DC | PRN
  Administered 2015-05-15: 50 ug via INTRAVENOUS
  Administered 2015-05-15: 12.5 ug via INTRATHECAL
  Administered 2015-05-15: 37.5 ug via INTRAVENOUS

## 2015-05-15 MED ORDER — OXYCODONE-ACETAMINOPHEN 5-325 MG PO TABS
2.0000 | ORAL_TABLET | ORAL | Status: DC | PRN
  Administered 2015-05-16 – 2015-05-18 (×12): 2 via ORAL
  Filled 2015-05-15 (×11): qty 2

## 2015-05-15 MED ORDER — KETOROLAC TROMETHAMINE 30 MG/ML IJ SOLN
30.0000 mg | Freq: Once | INTRAMUSCULAR | Status: AC
  Administered 2015-05-15: 30 mg via INTRAVENOUS
  Filled 2015-05-15: qty 1

## 2015-05-15 MED ORDER — LACTATED RINGERS IV SOLN: 500.0000 mL | INTRAVENOUS | Status: DC | PRN

## 2015-05-15 MED ORDER — SIMETHICONE 80 MG PO CHEW
80.0000 mg | CHEWABLE_TABLET | ORAL | Status: DC | PRN
  Administered 2015-05-17 (×2): 80 mg via ORAL

## 2015-05-15 MED ORDER — PROPOFOL 10 MG/ML IV BOLUS
INTRAVENOUS | Status: AC
  Filled 2015-05-15: qty 20

## 2015-05-15 MED ORDER — KETOROLAC TROMETHAMINE 30 MG/ML IJ SOLN: 30.0000 mg | Freq: Once | INTRAMUSCULAR | Status: DC

## 2015-05-15 MED ORDER — PHENYLEPHRINE 8 MG IN D5W 100 ML (0.08MG/ML) PREMIX OPTIME
INJECTION | INTRAVENOUS | Status: DC | PRN
  Administered 2015-05-15: 60 ug/min via INTRAVENOUS

## 2015-05-15 MED ORDER — OXYTOCIN 10 UNIT/ML IJ SOLN
40.0000 [IU] | INTRAVENOUS | Status: DC | PRN
  Administered 2015-05-15: 40 [IU] via INTRAVENOUS

## 2015-05-15 SURGICAL SUPPLY — 34 items
CLAMP CORD UMBIL (MISCELLANEOUS) IMPLANT
CLOTH BEACON ORANGE TIMEOUT ST (SAFETY) ×3 IMPLANT
DRAPE SHEET LG 3/4 BI-LAMINATE (DRAPES) IMPLANT
DRSG OPSITE POSTOP 4X10 (GAUZE/BANDAGES/DRESSINGS) ×3 IMPLANT
DURAPREP 26ML APPLICATOR (WOUND CARE) ×3 IMPLANT
ELECT REM PT RETURN 9FT ADLT (ELECTROSURGICAL) ×3
ELECTRODE REM PT RTRN 9FT ADLT (ELECTROSURGICAL) ×1 IMPLANT
EXTRACTOR VACUUM M CUP 4 TUBE (SUCTIONS) IMPLANT
EXTRACTOR VACUUM M CUP 4' TUBE (SUCTIONS)
GLOVE BIO SURGEON STRL SZ8.5 (GLOVE) ×3 IMPLANT
GOWN STRL REUS W/TWL 2XL LVL3 (GOWN DISPOSABLE) ×3 IMPLANT
GOWN STRL REUS W/TWL LRG LVL3 (GOWN DISPOSABLE) ×5 IMPLANT
KIT ABG SYR 3ML LUER SLIP (SYRINGE) IMPLANT
LIQUID BAND (GAUZE/BANDAGES/DRESSINGS) ×3 IMPLANT
NDL HYPO 25X5/8 SAFETYGLIDE (NEEDLE) IMPLANT
NEEDLE HYPO 25X5/8 SAFETYGLIDE (NEEDLE) IMPLANT
NS IRRIG 1000ML POUR BTL (IV SOLUTION) ×3 IMPLANT
PACK C SECTION WH (CUSTOM PROCEDURE TRAY) ×3 IMPLANT
PAD OB MATERNITY 4.3X12.25 (PERSONAL CARE ITEMS) ×3 IMPLANT
RTRCTR C-SECT PINK 25CM LRG (MISCELLANEOUS) ×2 IMPLANT
STAPLER VISISTAT 35W (STAPLE) IMPLANT
SUT CHROMIC 0 CT 802H (SUTURE) ×3 IMPLANT
SUT CHROMIC 0 MO4 CR (SUTURE) IMPLANT
SUT CHROMIC 1 CTX 36 (SUTURE) ×6 IMPLANT
SUT CHROMIC 2 0 SH (SUTURE) ×5 IMPLANT
SUT GUT PLAIN 0 CT-3 TAN 27 (SUTURE) IMPLANT
SUT MON AB 4-0 PS1 27 (SUTURE) ×3 IMPLANT
SUT PDS AB 0 CTX 36 PDP370T (SUTURE) IMPLANT
SUT VIC AB 0 CT1 18XCR BRD8 (SUTURE) IMPLANT
SUT VIC AB 0 CT1 8-18 (SUTURE)
SUT VIC AB 0 CTX 36 (SUTURE) ×6
SUT VIC AB 0 CTX36XBRD ANBCTRL (SUTURE) ×2 IMPLANT
TOWEL OR 17X24 6PK STRL BLUE (TOWEL DISPOSABLE) ×3 IMPLANT
TRAY FOLEY CATH SILVER 14FR (SET/KITS/TRAYS/PACK) ×3 IMPLANT

## 2015-05-15 NOTE — H&P (Signed)
This is Dr. Francoise Ceo dictating the history and physical on  Patty Valdez  she's a 29 year old gravida 3 para 202 at 40 weeks brought in for induction   positive GBS got penicillin EDC 05/15/2015 patient has a history of herpes and has been on Valtrex 500 by mouth daily since 35 weeks however she developed a lesion 2 days ago and she will be delivered by C-section because of active herpes she is not in labor Past medical history history of herpes in the past with very few outbreaks Past surgical history negative Social history smoker No drink no alcohol or drugs Family history mother hypertensive System review negative Physical exam well-developed female not in labor HEENT negative Lungs clear to P&A Heart regular rhythm no murmurs no gallops Breasts negative Abdomen term She has a active herpes lesion on the labia the pelvic was deferred Extremities negative

## 2015-05-15 NOTE — Op Note (Signed)
Preop diagnosis IUP at term with active herpes lesion not in labor membranes intact Postop diagnosis same Procedure low transverse cesarean section Anesthesia spinal Surgeon Dr. Francoise Ceo procedure after the spinal administered abdomen prepped and draped bladder emptied with a Foley catheter a transverse suprapubic incision made carried down to the rectus fascia fascia cleaned and incised the length of the incision recti muscles retracted laterally peritoneum incised longitudinally transverse incision made on the visceroperitoneum above the bladder and the bladder mobilized inferiorly transverse lower uterine incision made patient delivered from the op  position of a female Apgar 9 and 9 weighing 6 lbs. 10 oz. the fluid was meconium-stained the placenta was anterior and removed manually and sent to labor and delivery uterine cavity clean with dry laps the uterine incision closed in one layer with continuous suture of #1 chromic hemostasis was satisfactory bladder flap reattached  With 20 chromic uterus well contracted tubes and ovaries normal abdomen closed in layers peritoneum continuous with of 0 chromic fascia continuous with  0 dexon  skin closes subcuticular stitch of 4-0 Monocryl blood loss 600 cc patient tolerated procedure well

## 2015-05-15 NOTE — Transfer of Care (Signed)
Immediate Anesthesia Transfer of Care Note  [name]  Procedure(s) Performed: Procedure(s): CESAREAN SECTION (N/A)  Patient Location: PACU  Anesthesia Type:Spinal  Level of Consciousness: awake and alert   Airway & Oxygen Therapy: Patient Spontanous Breathing  Post-op Assessment: Report given to RN and Post -op Vital signs reviewed and stable  Post vital signs: Reviewed and stable  Last Vitals:  Filed Vitals:   05/15/15 1730  BP: 114/68  Pulse: 62  Temp: 36.6 C  Resp: 18    Complications: No apparent anesthesia complications

## 2015-05-15 NOTE — Anesthesia Procedure Notes (Signed)
Spinal  Patient location during procedure: OR Staffing Anesthesiologist: Biridiana Twardowski Performed by: anesthesiologist  Preanesthetic Checklist Completed: patient identified, site marked, surgical consent, pre-op evaluation, timeout performed, IV checked, risks and benefits discussed and monitors and equipment checked Spinal Block Patient position: sitting Prep: Betadine Patient monitoring: heart rate, continuous pulse ox and blood pressure Approach: right paramedian Location: L3-4 Injection technique: single-shot Needle Needle type: Sprotte  Needle gauge: 24 G Needle length: 9 cm Additional Notes Expiration date of kit checked and confirmed. Patient tolerated procedure well, without complications.     

## 2015-05-15 NOTE — Anesthesia Postprocedure Evaluation (Signed)
  Anesthesia Post-op Note  [name]  Procedure(s) Performed: Procedure(s) (LRB): CESAREAN SECTION (N/A)  Patient Location: PACU  Anesthesia Type: Spinal  Level of Consciousness: awake and alert   Airway and Oxygen Therapy: Patient Spontanous Breathing  Post-op Pain: mild  Post-op Assessment: Post-op Vital signs reviewed, Patient's Cardiovascular Status Stable, Respiratory Function Stable, Patent Airway and No signs of Nausea or vomiting  Last Vitals:  Filed Vitals:   05/15/15 1730  BP: 114/68  Pulse: 62  Temp: 36.6 C  Resp: 18    Post-op Vital Signs: stable   Complications: No apparent anesthesia complications

## 2015-05-15 NOTE — Anesthesia Preprocedure Evaluation (Addendum)
Anesthesia Evaluation  Patient identified by MRN, date of birth, ID band Patient awake    Reviewed: Allergy & Precautions, NPO status , Patient's Chart, lab work & pertinent test results  Airway Mallampati: II  TM Distance: >3 FB Neck ROM: Full    Dental no notable dental hx.    Pulmonary neg pulmonary ROS, Current Smoker,  breath sounds clear to auscultation  Pulmonary exam normal       Cardiovascular negative cardio ROS Normal cardiovascular examRhythm:Regular Rate:Normal     Neuro/Psych Anxiety negative neurological ROS     GI/Hepatic negative GI ROS, Neg liver ROS,   Endo/Other  negative endocrine ROS  Renal/GU negative Renal ROS  negative genitourinary   Musculoskeletal negative musculoskeletal ROS (+)   Abdominal   Peds negative pediatric ROS (+)  Hematology negative hematology ROS (+)   Anesthesia Other Findings   Reproductive/Obstetrics (+) Pregnancy                            Anesthesia Physical Anesthesia Plan  ASA: II  Anesthesia Plan: Spinal   Post-op Pain Management:    Induction:   Airway Management Planned:   Additional Equipment:   Intra-op Plan:   Post-operative Plan:   Informed Consent: I have reviewed the patients History and Physical, chart, labs and discussed the procedure including the risks, benefits and alternatives for the proposed anesthesia with the patient or authorized representative who has indicated his/her understanding and acceptance.   Dental advisory given  Plan Discussed with: CRNA  Anesthesia Plan Comments:         Anesthesia Quick Evaluation

## 2015-05-15 NOTE — Progress Notes (Signed)
Patient on valtrex for genital herpes, took her am dose however expresses she feels a lesion on labia today x several days - to inform MD for assessment prior to induction CHayes RN C 05/15/15

## 2015-05-16 ENCOUNTER — Encounter (HOSPITAL_COMMUNITY): Payer: Self-pay | Admitting: Obstetrics

## 2015-05-16 LAB — CBC
HCT: 22.4 % — ABNORMAL LOW (ref 36.0–46.0)
Hemoglobin: 7.7 g/dL — ABNORMAL LOW (ref 12.0–15.0)
MCH: 31.3 pg (ref 26.0–34.0)
MCHC: 34.4 g/dL (ref 30.0–36.0)
MCV: 91.1 fL (ref 78.0–100.0)
PLATELETS: 175 10*3/uL (ref 150–400)
RBC: 2.46 MIL/uL — ABNORMAL LOW (ref 3.87–5.11)
RDW: 14.1 % (ref 11.5–15.5)
WBC: 6.8 10*3/uL (ref 4.0–10.5)

## 2015-05-16 NOTE — Anesthesia Postprocedure Evaluation (Signed)
  Anesthesia Post-op Note  [name]  Procedure(s) Performed: Procedure(s): CESAREAN SECTION (N/A)  Patient Location: Mother/Baby  Anesthesia Type:Spinal  Level of Consciousness: awake, alert , oriented and patient cooperative  Airway and Oxygen Therapy: Patient Spontanous Breathing  Post-op Pain: none  Post-op Assessment: Post-op Vital signs reviewed, Patient's Cardiovascular Status Stable, Respiratory Function Stable, Patent Airway, No headache, No backache and Patient able to bend at knees  Post-op Vital Signs: Reviewed and stable  Last Vitals:  Filed Vitals:   05/16/15 0610  BP: 102/53  Pulse: 69  Temp: 36.9 C  Resp: 18    Complications: No apparent anesthesia complications

## 2015-05-16 NOTE — Progress Notes (Signed)
CSW acknowledges consult for maternal history of depression, anxiety, and suicide attempts.  CSW attempted to meet with MOB, but she had numerous visitors in her room.  CSW introduced self and reason for visit. MOB presented as receptive to meeting with CSW, but agreed that it would be better when there were fewer visitors in the room.   CSW to continue to follow. 

## 2015-05-16 NOTE — Progress Notes (Signed)
Patient ID: Patty Valdez, female   DOB: [birth year], 29 y.o.   MRN: [id number] Postop day 1 Blood pressure 129/75 respirations 16 pulse 106 afebrile Asian has no complaints Abdomen soft dressing dry Lochia moderate Legs negative doing well

## 2015-05-16 NOTE — Addendum Note (Signed)
Addendum  created 05/16/15 0726 by Yolonda KidaAlison L Destane Speas, CRNA   Modules edited: Notes Section   Notes Section:  File: [id number]

## 2015-05-16 NOTE — Lactation Note (Signed)
This note was copied from the chart of Patty Marvene Lashway. Lactation Consultation Note  Patient Name: Patty Valdez ZSWFU'X Date: 05/16/2015 Reason for consult: Initial assessment  Baby 25 hours old. Mom states that she has decided that she wants to try to pump and bottle feed EBM. Mom has given formula already. Discussed benefits of EBM. Set mom up with DEBP and enc to pump ever 2-3 hours for 15 minutes. Mom kept eyes closed while this LC attempted to discuss use of DEBP. When LC returned to room with additional supplies, mom up and taking pictures of baby. Enc mom to feed baby when baby cueing to feed. Enc mom to call out for assistance with pumping as needed. Mom given [facility] brochure, aware of OP/BFSG, community resources, and [facility] phone line assistance after D/C. Maternal Data Has patient been taught Hand Expression?: Yes (Per mom.) Does the patient have breastfeeding experience prior to this delivery?: No  Feeding    LATCH Score/Interventions                      Lactation Tools Discussed/Used Pump Review: Setup, frequency, and cleaning;Milk Storage Initiated by:: JW Date initiated:: 05/17/15   Consult Status Consult Status: Follow-up Date: 05/17/15 Follow-up type: In-patient    Geralynn Ochs 05/16/2015, 3:45 PM

## 2015-05-17 NOTE — Progress Notes (Signed)
Patient ID: Patty GentaHeather E Valdez, female   DOB: [birth year], 29 y.o.   MRN: [id number] Postop day 2 Blood pressure 105/52 respiration 20 pulse 65 Patient has no complaints Dressing dry abdomen soft Legs negative doing well

## 2015-05-17 NOTE — Progress Notes (Signed)
CLINICAL SOCIAL WORK MATERNAL/CHILD NOTE  Patient Details  Name: Patty Valdez MRN: [id number] Date of Birth: 05/15/2016  Date:  05/17/2015  Clinical Social Worker Initiating Note:  Loleta BooksSarah Tonio Seider, LCSW Date/ Time Initiated:  05/17/15/0900     Child's Name:  Patty Valdez   Legal Guardian:  Patty Valdez (mother) and Patty Valdez (father)  Need for Interpreter:  None   Date of Referral:  05/15/15     Reason for Referral:  Behavioral Health Issues, including SI    [facility] Source[facility]   Address:  [address] [place], [place] [address]  Phone number:  [phone]   Household Members:  Minor Children (has shared custody with their father), Spouse   Natural Supports (not living in the home):  Immediate Family, Extended Family   Professional Supports: MOB reported that she has previously received mental health care at [facility]. She shared belief that she was supported by this agency and has plans to re-start care.  Employment: Unemployed   Type of Work:   N/A  Education:    N/A  Architectinancial Resources:  Medicaid   Other Resources:  Sales executive[employer] Stamps , WIC   Cultural/Religious Considerations Which May Impact Care:  None reported  Strengths:  Ability to meet basic needs , Home prepared for child , Pediatrician chosen    Risk Factors/Current Problems:   1)Mental Health Concerns: MOB presents with history of depression/depression, with a suicide attempt and subsequent admission to [facility] in July 2015.  MOB is currently not participating in mental health treatment, but voiced goal of re-starting care now that she is postpartum. 2) Psychosocial stressors: MOB reported history of custody battle with father of her 2 oldest sons (6 and 7). She stated that she has participated in mediation, and now has more frequent contact and visits with them. She shared belief that this is a positive change.   Cognitive State:  Able to Concentrate ,  Alert , Goal Oriented , Linear Thinking , Insightful    Mood/Affect:  Interested , Flat    CSW Assessment:  CSW received request for consult due to MOB presenting with a history of depression, anxiety, and suicide attempt in July 2015.  MOB presented as easily engaged and receptive to the visit. The FOB was also present in the room, but he did not participate as he was observed to be resting.  MOB presented as tired, affect congruent, but she did smile and display a full range in affect when she looked at and interacted with the infant, and discussed her other sons.  MOB did not present with acute mental health symptoms, but presents with insight and self-awareness about her mental health needs as she transitions to the postpartum period.   MOB denied current mental health concerns as she transitions to the postpartum period, but acknowledged that she experienced depression/anxiety during the pregnancy, and has a significant history of depression and anxiety. Per MOB, she was diagnosed with depression/anxiety 6-7 years ago.  She stated that she has learned to cope with symptoms through the years, and has participated in therapy and medication management.  MOB shared that she has been tried on numerous medications, but "nothing seems to work".  MOB endorsed mental health crisis in July 2015 which resulted in MOB attempting to overdose on 42 Tegretol pills that resulted in an inpatient admission [facility] once she stabilized medically.  MOB shared that this suicide attempt and subsequent hospitalization was "life changing"  and discussed how she has put forth effort to "move forward".  She stated that at that time, "everything was going wrong", and discussed how she had minimal contact with her 2 oldest sons, she was unemployed, and was living in a crowded home with her mother. She stated that regrets attempting suicide since she has realized how it would have impacted her family and  friends.  MOB stated that since her inpatient admission she communicates her thoughts, feelings instead of isolating and internalizing her feelings.  She shared that talking to her support system helps her to "feel better".    In addition to talking to her support system about her feelings, she stated that stress has been reduced since she now has more contact and visits with her two oldest sons. She stated that she participated in meditation and now has more visits with her sons. MOB shared that this is helpful since she loves being a mother and loves spending time with her children. She discussed how spending time with them helps to distract her from negative thoughts and feelings of depression.  MOB also expressed gratitude for the birth of the infant since she has additional motivation to address her mental health needs.   Per MOB, she is currently not participating in any mental health care. MOB stated that she most recently was receiving therapy at [facility], but discontinued services since she did not like the care she was receiving.  MOB shared that she intends to re-start mental health services at [facility], where she previously received treatment . She discussed how she postponed her initial evaluation with this agency due to being in late stages of her pregnancy.  MOB presents with motivation to attend follow up appointment since she is able to reflect upon and look forward to potential outcome if she continues to have untreated mental health symptoms.  She stated that she also intends to be in contact with her PCP who previously prescribed her medications for anxiety since she continues to have a difficult time coping with racing thoughts which leads to insomnia.  MOB discussed how she has realized depression/anxiety impact her ability to the woman, mother, and wife she wants to be, and presents with insight on potential gains if she were able to have symptoms well controlled.    MOB denied additional questions, concerns, or needs at this time. She presents with awareness of importance of mental health follow and also presents with a plan to address her needs as she transitions to the postpartum period. She denies history of postpartum depression/anxiety, but acknowledged increased risk due to mental health history and mental health crisis less than one year ago.   MOB expressed appreciation for the visit and agreed to contact CSW if needs arise.  CSW Plan/Description:   1)Patient/Family Education: Perinatal mood and anxiety disorders 2)Information/Referral to [employer]: MOB reported desire to follow up with [facility] for mental health care in the postpartum. She also stated goal of contacting her PCP in order to re-start medications for anxiety. 3)No Further Intervention Required/No Barriers to Discharge    Kelby Fam 05/17/2015, 10:27 AM

## 2015-05-17 NOTE — Lactation Note (Signed)
This note was copied from the chart of Patty Valdez. Lactation Consultation Note  Patient Name: Patty Valdez ZOXWR'UToday's Date: 05/17/2015 Reason for consult: Follow-up assessment Baby 51 hours old. Mom reports that she has put the baby to breast today, but states that it is "uncomfortable" and "weird." Mom states that she wishes she did not feel that way, but would prefer to pump and bottle feed EBM. So, mom states that although she has not pumped today, she intends to pump through the night. Enc mom to pump with each feeding. Discussed normal progression of milk coming in and enc mom to continue to supplement with formula until her milk comes in. Enc mom to give whatever EBM she gets while pumping and hand expressing to baby.  Maternal Data    Feeding Feeding Type: Formula  LATCH Score/Interventions                      Lactation Tools Discussed/Used     Consult Status Consult Status: Follow-up Date: 05/18/15 Follow-up type: In-patient    Geralynn OchsWILLIARD, [place]etta 05/17/2015, 6:02 PM

## 2015-05-18 NOTE — Discharge Instructions (Signed)
Discharge instructions   You can wash your hair  Shower  Eat what you want  Drink what you want  See me in 6 weeks  Your ankles are going to swell more in the next 2 weeks than when pregnant  No sex for 6 weeks   Geana Walts A, MD 05/18/2015

## 2015-05-18 NOTE — Progress Notes (Signed)
Patient in a hurry to leave hospital. Informed her that I,RN, needed to check in my new admission and I would be there to check the patient.Grandmother stated that family had to be to work and that their truck was running out front. I found someone to check in my new admission and went straight to 146 to get them discharged.  Assessed the mother and the baby which was in WNL, discharge instructions reviewed. Patient's education was not complete but patient stated she didn't have any education questions. When walking patient to the car, their truck was parked out in the parking lot and was not running.

## 2015-05-18 NOTE — Progress Notes (Signed)
Patient ID: Patty Valdez, female   DOB: Sep 27, 1986, 29 y.o.   MRN: [id number] Postop day 3 Vital signs normal Blood pressure 118/63 respiration 19 pulse 79 Incision clean and dry Legs negative Home today

## 2015-05-18 NOTE — Discharge Summary (Signed)
Obstetric Discharge Summary Reason for Admission: induction of labor Prenatal Procedures: none Intrapartum Procedures: cesarean: low cervical, transverse Postpartum Procedures: none Complications-Operative and Postpartum: none HEMOGLOBIN  Date Value Ref Range Status  05/16/2015 7.7* 12.0 - 15.0 g/dL Final    Comment:    DELTA CHECK NOTED REPEATED TO VERIFY    HCT  Date Value Ref Range Status  05/16/2015 22.4* 36.0 - 46.0 % Final    Physical Exam:  General: alert Lochia: appropriate Uterine Fundus: firm Incision: healing well DVT Evaluation: No evidence of DVT seen on physical exam.  Discharge Diagnoses: Term Pregnancy-delivered  Discharge Information: Date: 05/18/2015 Activity: pelvic rest Diet: routine Medications: Percocet Condition: stable Instructions: refer to practice specific booklet Discharge to: home Follow-up Information    Follow up with MARSHALL,BERNARD A, MD In 6 weeks.   Specialty:  Obstetrics and Gynecology   Contact information:   [address] [place] [address] [phone]       Newborn Data: Live born female  Birth Weight: 6 lb 14 oz (3118 g) APGAR: 9, 9  Home with mother.  MARSHALL,BERNARD A 05/18/2015, 6:37 AM

## 2015-06-02 ENCOUNTER — Emergency Department (HOSPITAL_COMMUNITY): Payer: Medicaid Other

## 2015-06-02 ENCOUNTER — Emergency Department (HOSPITAL_COMMUNITY)
Admission: EM | Admit: 2015-06-02 | Discharge: 2015-06-02 | Disposition: A | Discharge status: Home / Self Care | Payer: Medicaid Other | Attending: Emergency Medicine | Admitting: Emergency Medicine

## 2015-06-02 ENCOUNTER — Encounter (HOSPITAL_COMMUNITY): Payer: Self-pay | Admitting: *Deleted

## 2015-06-02 DIAGNOSIS — Z8679 Personal history of other diseases of the circulatory system: Secondary | ICD-10-CM | POA: Insufficient documentation

## 2015-06-02 DIAGNOSIS — M549 Dorsalgia, unspecified: Secondary | ICD-10-CM | POA: Diagnosis not present

## 2015-06-02 DIAGNOSIS — R1030 Lower abdominal pain, unspecified: Secondary | ICD-10-CM | POA: Diagnosis not present

## 2015-06-02 DIAGNOSIS — Z9049 Acquired absence of other specified parts of digestive tract: Secondary | ICD-10-CM | POA: Insufficient documentation

## 2015-06-02 DIAGNOSIS — Z8659 Personal history of other mental and behavioral disorders: Secondary | ICD-10-CM | POA: Diagnosis not present

## 2015-06-02 DIAGNOSIS — Z9889 Other specified postprocedural states: Secondary | ICD-10-CM | POA: Diagnosis not present

## 2015-06-02 DIAGNOSIS — N939 Abnormal uterine and vaginal bleeding, unspecified: Secondary | ICD-10-CM | POA: Diagnosis not present

## 2015-06-02 DIAGNOSIS — R110 Nausea: Secondary | ICD-10-CM | POA: Insufficient documentation

## 2015-06-02 DIAGNOSIS — Z8619 Personal history of other infectious and parasitic diseases: Secondary | ICD-10-CM | POA: Insufficient documentation

## 2015-06-02 DIAGNOSIS — E669 Obesity, unspecified: Secondary | ICD-10-CM | POA: Insufficient documentation

## 2015-06-02 DIAGNOSIS — Z720 Tobacco use: Secondary | ICD-10-CM | POA: Insufficient documentation

## 2015-06-02 DIAGNOSIS — Z862 Personal history of diseases of the blood and blood-forming organs and certain disorders involving the immune mechanism: Secondary | ICD-10-CM | POA: Diagnosis not present

## 2015-06-02 DIAGNOSIS — G8929 Other chronic pain: Secondary | ICD-10-CM | POA: Insufficient documentation

## 2015-06-02 LAB — BASIC METABOLIC PANEL
Anion gap: 9 (ref 5–15)
BUN: 10 mg/dL (ref 6–20)
CO2: 23 mmol/L (ref 22–32)
Calcium: 8.9 mg/dL (ref 8.9–10.3)
Chloride: 103 mmol/L (ref 101–111)
Creatinine, Ser: 0.65 mg/dL (ref 0.44–1.00)
GFR calc non Af Amer: >60 mL/min (ref >60)
Glucose, Bld: 83 mg/dL (ref 65–99)
Potassium: 3.9 mmol/L (ref 3.5–5.1)
Sodium: 135 mmol/L (ref 135–145)

## 2015-06-02 LAB — CBC WITH DIFFERENTIAL/PLATELET
Basophils Absolute: 0.1 10*3/uL (ref 0.0–0.1)
Basophils Relative: 1 % (ref 0–1)
Eosinophils Absolute: 0.1 10*3/uL (ref 0.0–0.7)
Eosinophils Relative: 2 % (ref 0–5)
HCT: 31.3 % — ABNORMAL LOW (ref 36.0–46.0)
Hemoglobin: 10.2 g/dL — ABNORMAL LOW (ref 12.0–15.0)
LYMPHS ABS: 2.1 10*3/uL (ref 0.7–4.0)
LYMPHS PCT: 33 % (ref 12–46)
MCH: 29.2 pg (ref 26.0–34.0)
MCHC: 32.6 g/dL (ref 30.0–36.0)
MCV: 89.7 fL (ref 78.0–100.0)
Monocytes Absolute: 0.6 10*3/uL (ref 0.1–1.0)
Monocytes Relative: 9 % (ref 3–12)
NEUTROS PCT: 55 % (ref 43–77)
Neutro Abs: 3.4 10*3/uL (ref 1.7–7.7)
PLATELETS: ADEQUATE 10*3/uL (ref 150–400)
RBC: 3.49 MIL/uL — AB (ref 3.87–5.11)
RDW: 13.5 % (ref 11.5–15.5)
WBC: 6.3 10*3/uL (ref 4.0–10.5)

## 2015-06-02 LAB — URINE MICROSCOPIC-ADD ON

## 2015-06-02 LAB — URINALYSIS, ROUTINE W REFLEX MICROSCOPIC
Glucose, UA: NEGATIVE mg/dL
Ketones, ur: 15 mg/dL — AB
NITRITE: POSITIVE — AB
PH: 5 (ref 5.0–8.0)
Protein, ur: >300 mg/dL — AB
Specific Gravity, Urine: 1.011 (ref 1.005–1.030)
UROBILINOGEN UA: 0.2 mg/dL (ref 0.0–1.0)

## 2015-06-02 MED ORDER — ONDANSETRON HCL 4 MG/2ML IJ SOLN
4.0000 mg | Freq: Once | INTRAMUSCULAR | Status: AC
Start: 2015-06-02 — End: 2015-06-02
  Administered 2015-06-02: 4 mg via INTRAVENOUS
  Filled 2015-06-02: qty 2

## 2015-06-02 MED ORDER — IOHEXOL 300 MG/ML  SOLN
50.0000 mL | Freq: Once | INTRAMUSCULAR | Status: AC | PRN
  Administered 2015-06-02: 50 mL via ORAL

## 2015-06-02 MED ORDER — SODIUM CHLORIDE 0.9 % IV BOLUS (SEPSIS)
1000.0000 mL | Freq: Once | INTRAVENOUS | Status: AC
  Administered 2015-06-02: 1000 mL via INTRAVENOUS

## 2015-06-02 MED ORDER — OXYCODONE-ACETAMINOPHEN 5-325 MG PO TABS: 1.0000 | ORAL_TABLET | ORAL | Status: DC | PRN

## 2015-06-02 MED ORDER — ONDANSETRON HCL 4 MG/2ML IJ SOLN
4.0000 mg | Freq: Once | INTRAMUSCULAR | Status: AC
  Administered 2015-06-02: 4 mg via INTRAVENOUS
  Filled 2015-06-02: qty 2

## 2015-06-02 MED ORDER — ONDANSETRON 4 MG PO TBDP: ORAL_TABLET | ORAL | Status: DC

## 2015-06-02 MED ORDER — IOHEXOL 300 MG/ML  SOLN
100.0000 mL | Freq: Once | INTRAMUSCULAR | Status: AC | PRN
  Administered 2015-06-02: 100 mL via INTRAVENOUS

## 2015-06-02 MED ORDER — MORPHINE SULFATE 4 MG/ML IJ SOLN
4.0000 mg | Freq: Once | INTRAMUSCULAR | Status: AC
  Administered 2015-06-02: 4 mg via INTRAVENOUS
  Filled 2015-06-02: qty 1

## 2015-06-02 MED ORDER — NITROFURANTOIN MONOHYD MACRO 100 MG PO CAPS: 100.0000 mg | ORAL_CAPSULE | Freq: Two times a day (BID) | ORAL | Status: DC

## 2015-06-02 NOTE — ED Notes (Signed)
Pt back from CT.  Pt is crying. Pt's significant other is in hallway demanding to know what she was given (IV contrast in CT) bc she is telling him that her stomach is hurting worse.  Made Dr Criss AlvineGoldston aware.  No new orders at this time,

## 2015-06-02 NOTE — Discharge Instructions (Signed)
Abdominal Pain, Women °Abdominal (stomach, pelvic, or belly) pain can be caused by many things. It is important to tell your doctor: °· The location of the pain. °· Does it come and go or is it present all the time? °· Are there things that start the pain (eating certain foods, exercise)? °· Are there other symptoms associated with the pain (fever, nausea, vomiting, diarrhea)? °All of this is helpful to know when trying to find the cause of the pain. °CAUSES  °· Stomach: virus or bacteria infection, or ulcer. °· Intestine: appendicitis (inflamed appendix), regional ileitis (Crohn's disease), ulcerative colitis (inflamed colon), irritable bowel syndrome, diverticulitis (inflamed diverticulum of the colon), or cancer of the stomach or intestine. °· Gallbladder disease or stones in the gallbladder. °· Kidney disease, kidney stones, or infection. °· Pancreas infection or cancer. °· Fibromyalgia (pain disorder). °· Diseases of the female organs: °· Uterus: fibroid (non-cancerous) tumors or infection. °· Fallopian tubes: infection or tubal pregnancy. °· Ovary: cysts or tumors. °· Pelvic adhesions (scar tissue). °· Endometriosis (uterus lining tissue growing in the pelvis and on the pelvic organs). °· Pelvic congestion syndrome (female organs filling up with blood just before the menstrual period). °· Pain with the menstrual period. °· Pain with ovulation (producing an egg). °· Pain with an IUD (intrauterine device, birth control) in the uterus. °· Cancer of the female organs. °· Functional pain (pain not caused by a disease, may improve without treatment). °· Psychological pain. °· Depression. °DIAGNOSIS  °Your doctor will decide the seriousness of your pain by doing an examination. °· Blood tests. °· X-rays. °· Ultrasound. °· CT scan (computed tomography, special type of X-ray). °· MRI (magnetic resonance imaging). °· Cultures, for infection. °· Barium enema (dye inserted in the large intestine, to better view it with  X-rays). °· Colonoscopy (looking in intestine with a lighted tube). °· Laparoscopy (minor surgery, looking in abdomen with a lighted tube). °· Major abdominal exploratory surgery (looking in abdomen with a large incision). °TREATMENT  °The treatment will depend on the cause of the pain.  °· Many cases can be observed and treated at home. °· Over-the-counter medicines recommended by your caregiver. °· Prescription medicine. °· Antibiotics, for infection. °· Birth control pills, for painful periods or for ovulation pain. °· Hormone treatment, for endometriosis. °· Nerve blocking injections. °· Physical therapy. °· Antidepressants. °· Counseling with a psychologist or psychiatrist. °· Minor or major surgery. °HOME CARE INSTRUCTIONS  °· Do not take laxatives, unless directed by your caregiver. °· Take over-the-counter pain medicine only if ordered by your caregiver. Do not take aspirin because it can cause an upset stomach or bleeding. °· Try a clear liquid diet (broth or water) as ordered by your caregiver. Slowly move to a bland diet, as tolerated, if the pain is related to the stomach or intestine. °· Have a thermometer and take your temperature several times a day, and record it. °· Bed rest and sleep, if it helps the pain. °· Avoid sexual intercourse, if it causes pain. °· Avoid stressful situations. °· Keep your follow-up appointments and tests, as your caregiver orders. °· If the pain does not go away with medicine or surgery, you may try: °¨ Acupuncture. °¨ Relaxation exercises (yoga, meditation). °¨ Group therapy. °¨ Counseling. °SEEK MEDICAL CARE IF:  °· You notice certain foods cause stomach pain. °· Your home care treatment is not helping your pain. °· You need stronger pain medicine. °· You want your IUD removed. °· You feel faint or   lightheaded. °· You develop nausea and vomiting. °· You develop a rash. °· You are having side effects or an allergy to your medicine. °SEEK IMMEDIATE MEDICAL CARE IF:  °· Your  pain does not go away or gets worse. °· You have a fever. °· Your pain is felt only in portions of the abdomen. The right side could possibly be appendicitis. The left lower portion of the abdomen could be colitis or diverticulitis. °· You are passing blood in your stools (bright red or black tarry stools, with or without vomiting). °· You have blood in your urine. °· You develop chills, with or without a fever. °· You pass out. °MAKE SURE YOU:  °· Understand these instructions. °· Will watch your condition. °· Will get help right away if you are not doing well or get worse. °Document Released: 09/02/2007 Document Revised: 03/22/2014 Document Reviewed: 09/22/2009 °ExitCare® Patient Information ©2015 [employer]. This information is not intended to replace advice given to you by your health care provider. Make sure you discuss any questions you have with your health care provider. ° °Urinary Tract Infection °Urinary tract infections (UTIs) can develop anywhere along your urinary tract. Your urinary tract is your body's drainage system for removing wastes and extra water. Your urinary tract includes two kidneys, two ureters, a bladder, and a urethra. Your kidneys are a pair of bean-shaped organs. Each kidney is about the size of your fist. They are located below your ribs, one on each side of your spine. °CAUSES °Infections are caused by microbes, which are microscopic organisms, including fungi, viruses, and bacteria. These organisms are so small that they can only be seen through a microscope. Bacteria are the microbes that most commonly cause UTIs. °SYMPTOMS  °Symptoms of UTIs may vary by age and gender of the patient and by the location of the infection. Symptoms in young women typically include a frequent and intense urge to urinate and a painful, burning feeling in the bladder or urethra during urination. Older women and men are more likely to be tired, shaky, and weak and have muscle aches and abdominal pain.  A fever may mean the infection is in your kidneys. Other symptoms of a kidney infection include pain in your back or sides below the ribs, nausea, and vomiting. °DIAGNOSIS °To diagnose a UTI, your caregiver will ask you about your symptoms. Your caregiver also will ask to provide a urine sample. The urine sample will be tested for bacteria and white blood cells. White blood cells are made by your body to help fight infection. °TREATMENT  °Typically, UTIs can be treated with medication. Because most UTIs are caused by a bacterial infection, they usually can be treated with the use of antibiotics. The choice of antibiotic and length of treatment depend on your symptoms and the type of bacteria causing your infection. °HOME CARE INSTRUCTIONS °· If you were prescribed antibiotics, take them exactly as your caregiver instructs you. Finish the medication even if you feel better after you have only taken some of the medication. °· Drink enough water and fluids to keep your urine clear or pale yellow. °· Avoid caffeine, tea, and carbonated beverages. They tend to irritate your bladder. °· Empty your bladder often. Avoid holding urine for long periods of time. °· Empty your bladder before and after sexual intercourse. °· After a bowel movement, women should cleanse from front to back. Use each tissue only once. °SEEK MEDICAL CARE IF:  °· You have back pain. °· You develop   a fever. °· Your symptoms do not begin to resolve within 3 days. °SEEK IMMEDIATE MEDICAL CARE IF:  °· You have severe back pain or lower abdominal pain. °· You develop chills. °· You have nausea or vomiting. °· You have continued burning or discomfort with urination. °MAKE SURE YOU:  °· Understand these instructions. °· Will watch your condition. °· Will get help right away if you are not doing well or get worse. °Document Released: 08/15/2005 Document Revised: 05/06/2012 Document Reviewed: 12/14/2011 °ExitCare® Patient Information ©2015 [employer]. This  information is not intended to replace advice given to you by your health care provider. Make sure you discuss any questions you have with your health care provider. ° °

## 2015-06-02 NOTE — ED Notes (Addendum)
Pt reports delivery baby boy on 6/26 via c section. 3rd delivery, 1st c section. Pt reports lower abd pain x3 days pain 10/10, pain woke pt up out of sleep last night. Pt had intercourse on 7/12. Suture line well approximated  Pt is not breastfeeding.

## 2015-06-02 NOTE — ED Provider Notes (Signed)
CSN: [id number]     Arrival date & time 06/02/15  0734 History   First MD Initiated Contact with Patient 06/02/15 0749     Chief Complaint  Patient presents with  . Abdominal Pain  . delivered 6/26      (Consider location/radiation/quality/duration/timing/severity/associated sxs/prior Treatment) HPI  29 year old female presents with lower abdominal pain over the past couple days. Started off milder but significantly worsened this morning when it woke her up about 1 hour ago out of sleep. Pain is right at her C-section incision. Had c-section on 05/15/15. No drainage or redness around the wound. Had intercourse 2 days ago but patient was already present and did not worsen at that time. Still having vaginal bleeding but no worse than she has already had post-partum. No fevers. Nauseated but no vomiting. Took her last percocet 2 days ago. Ran out of percocet prior to this pain starting. No dysuria or frequency. Rates her pain as a 10/10.  Past Medical History  Diagnosis Date  . Gallstones   . Nausea   . Abdominal pain   . Vaginal bleeding   . Anemia 10 yrs ago  . Anxiety   . Genital herpes     no current meds for  . Complication of anesthesia 04-08-2009    low blood pressure after epidural, had to take iv meds for  . Migraine   . Obesity    Past Surgical History  Procedure Laterality Date  . No past surgeries    . Cholecystectomy    . Cesarean section N/A 05/15/2015    Procedure: CESAREAN SECTION;  Surgeon: Kathreen Cosier, MD;  Location: [facility];  Service: Obstetrics;  Laterality: N/A;   Family History  Problem Relation Age of Onset  . Cancer Maternal Grandfather     prostate  . Hypertension Mother   . Diabetes Mother   . Neuropathy Mother    History  Substance Use Topics  . Smoking status: Current Every Day Smoker -- 0.50 packs/day for 10 years    Types: Cigarettes  . Smokeless tobacco: Never Used  . Alcohol Use: No     Comment: last drink was 1 yr ago   OB History      Gravida Para Term Preterm AB TAB SAB Ectopic Multiple Living   0 3     Review of Systems  Constitutional: Negative for fever.  Gastrointestinal: Positive for nausea and abdominal pain. Negative for vomiting.  Genitourinary: Positive for vaginal bleeding. Negative for dysuria.  Musculoskeletal: Positive for back pain (chronic, not worse now).  All other systems reviewed and are negative.     Allergies  Review of patient's allergies indicates no known allergies.  Home Medications   Prior to Admission medications   Not on File   BP 112/79 mmHg  Pulse 89  Temp(Src) 98.7 F (37.1 C) (Oral)  Resp 16  SpO2 98% Physical Exam  Constitutional: She is oriented to person, place, and time. She appears well-developed and well-nourished.  crying  HENT:  Head: Normocephalic and atraumatic.  Right Ear: External ear normal.  Left Ear: External ear normal.  Nose: Nose normal.  Eyes: Right eye exhibits no discharge. Left eye exhibits no discharge.  Cardiovascular: Normal rate, regular rhythm and normal heart sounds.   Pulmonary/Chest: Effort normal and breath sounds normal.  Abdominal: Soft. There is tenderness in the suprapubic area.    Neurological: She is alert and oriented to person, place, and  time.  Skin: Skin is warm and dry. She is not diaphoretic.  Nursing note and vitals reviewed.   ED Course  Procedures (including critical care time) Labs Review Labs Reviewed  CBC WITH DIFFERENTIAL/PLATELET - Abnormal; Notable for the following:    RBC 3.49 (*)    Hemoglobin 10.2 (*)    HCT 31.3 (*)    All other components within normal limits  URINALYSIS, ROUTINE W REFLEX MICROSCOPIC (NOT AT [facility]) - Abnormal; Notable for the following:    Color, Urine RED (*)    APPearance TURBID (*)    Hgb urine dipstick LARGE (*)    Bilirubin Urine LARGE (*)    Ketones, ur 15 (*)    Protein, ur >300 (*)    Nitrite POSITIVE (*)    Leukocytes, UA LARGE (*)    All other components  within normal limits  URINE MICROSCOPIC-ADD ON - Abnormal; Notable for the following:    Squamous Epithelial / LPF MANY (*)    Bacteria, UA MANY (*)    All other components within normal limits  URINE CULTURE  BASIC METABOLIC PANEL    Imaging Review Ct Abdomen Pelvis W Contrast  06/02/2015   CLINICAL DATA:  Right lower quadrant pain. Delivered baby boy a June 26 via C-section. Right lower quadrant pain for 3 days with nausea, vaginal bleeding since C-section.  EXAM: CT ABDOMEN AND PELVIS WITH CONTRAST  TECHNIQUE: Multidetector CT imaging of the abdomen and pelvis was performed using the standard protocol following bolus administration of intravenous contrast.  CONTRAST:  50mL OMNIPAQUE IOHEXOL 300 MG/ML SOLN, OMNIPAQUE IOHEXOL 300 MG/ML SOLN  COMPARISON:  02/06/2010  FINDINGS: Lower chest: 12 mm right lower lobe pulmonary nodule posteriorly with a pleural tail. Normal heart size.  Hepatobiliary: Normal liver. Prior cholecystectomy. Mild prominence of the central intrahepatic biliary tree. No extrahepatic biliary ductal dilatation. No focal hepatic lesion.  Pancreas: Normal pancreas.  Spleen: Normal spleen.  Adrenals/Urinary Tract: Normal adrenal glands. Normal kidneys. No urolithiasis or obstructive uropathy. Normal bladder.  Stomach/Bowel: No bowel wall thickening or bowel dilatation. No pneumatosis, pneumoperitoneum or portal venous gas. No abdominal or pelvic free fluid. Normal appendix.  Vascular/Lymphatic: Normal caliber abdominal aorta. No abdominal or pelvic lymphadenopathy.  Reproductive: Postpartum uterus. Moderate amount of fluid in the endometrial cavity which may reflect blood products given the patient's history vaginal bleeding. Normal ovaries. No adnexal mass.  Other: No focal fluid collection.  Musculoskeletal: No lytic or sclerotic osseous lesion. No acute osseous abnormality.  IMPRESSION: 1. Normal appendix. 2. Postpartum uterus. Moderate amount of fluid in the endometrial cavity  which may reflect blood products given the patient's history vaginal bleeding. 3. 12 mm right lower lobe nodular opacity with a pleural tail may reflect an area of round atelectasis versus a true pulmonary nodule. Recommend follow-up chest x-ray in 3 months.   Electronically Signed   By: Elige Ko   On: 06/02/2015 10:12     EKG Interpretation None      MDM   Final diagnoses:  Lower abdominal pain    Since pain has improved since IV morphine. CT scan shows no significant abnormalities on explain her pain. There is no obvious infection of her C-section site. She does have a dirty urine but is unclear if this is from a dirty catch or if she has a UTI. In that she is close postop will treat as a possible UTI. She is currently not breast-feeding and does not have any plans to breast-feed. I  discussed the CT scan results and the patient with her OB/GYN, Dr. Gaynell FaceMarshall, who requests that I give her Percocet (5/325 #30) as this may be contribute a lot and he will follow her up on 7/18 in his office.    Pricilla LovelessScott Vianca Bracher, MD 06/02/15 [phone]

## 2015-06-03 LAB — URINE CULTURE

## 2015-06-11 ENCOUNTER — Encounter (HOSPITAL_COMMUNITY): Payer: Self-pay

## 2015-06-11 ENCOUNTER — Inpatient Hospital Stay (HOSPITAL_COMMUNITY)
Admission: AD | Admit: 2015-06-11 | Discharge: 2015-06-15 | DRG: 885 | Disposition: A | Discharge status: Home / Self Care | Payer: Medicaid Other | Attending: Psychiatry | Admitting: Psychiatry

## 2015-06-11 DIAGNOSIS — F411 Generalized anxiety disorder: Secondary | ICD-10-CM | POA: Diagnosis present

## 2015-06-11 DIAGNOSIS — R45851 Suicidal ideations: Secondary | ICD-10-CM | POA: Diagnosis not present

## 2015-06-11 DIAGNOSIS — F332 Major depressive disorder, recurrent severe without psychotic features: Principal | ICD-10-CM | POA: Diagnosis present

## 2015-06-11 DIAGNOSIS — Z8249 Family history of ischemic heart disease and other diseases of the circulatory system: Secondary | ICD-10-CM

## 2015-06-11 DIAGNOSIS — Z833 Family history of diabetes mellitus: Secondary | ICD-10-CM | POA: Diagnosis not present

## 2015-06-11 DIAGNOSIS — F17210 Nicotine dependence, cigarettes, uncomplicated: Secondary | ICD-10-CM | POA: Diagnosis present

## 2015-06-11 MED ORDER — ACETAMINOPHEN 325 MG PO TABS
ORAL_TABLET | ORAL | Status: AC
  Administered 2015-06-11: 650 mg
  Filled 2015-06-11: qty 2

## 2015-06-11 MED ORDER — MAGNESIUM HYDROXIDE 400 MG/5ML PO SUSP: 30.0000 mL | Freq: Every day | ORAL | Status: DC | PRN

## 2015-06-11 MED ORDER — ACETAMINOPHEN 325 MG PO TABS
650.0000 mg | ORAL_TABLET | Freq: Four times a day (QID) | ORAL | Status: DC | PRN
  Administered 2015-06-12 – 2015-06-15 (×4): 650 mg via ORAL
  Filled 2015-06-11 (×4): qty 2

## 2015-06-11 MED ORDER — ALUM & MAG HYDROXIDE-SIMETH 200-200-20 MG/5ML PO SUSP: 30.0000 mL | ORAL | Status: DC | PRN

## 2015-06-11 MED ORDER — NICOTINE 21 MG/24HR TD PT24
21.0000 mg | MEDICATED_PATCH | Freq: Every day | TRANSDERMAL | Status: DC
  Administered 2015-06-12 – 2015-06-14 (×3): 21 mg via TRANSDERMAL
  Filled 2015-06-11 (×3): qty 1
  Filled 2015-06-11: qty 14
  Filled 2015-06-11 (×3): qty 1

## 2015-06-11 NOTE — Progress Notes (Signed)
[facility] Group Notes:  (Nursing/MHT/Case Management/Adjunct)  Date:  06/11/2015  Time:  11:21 PM  Type of Therapy:  Group Therapy  Participation Level:  Did Not Attend  Participation Quality:  Did Not Attend  Affect:  Did Not Attend  Cognitive:  Did Not Attend  Insight:  None  Engagement in Group:  Did Not Attend  Modes of Intervention:  Socialization and Support  Summary of Progress/Problems: Pt. Was sleeping in bed.  Sondra Come 06/11/2015, 11:21 PM
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
## 2015-06-11 NOTE — Tx Team (Signed)
Initial Interdisciplinary Treatment Plan   PATIENT STRESSORS: Legal issue Medication change or noncompliance   PATIENT STRENGTHS: Ability for insight Average or above average intelligence Capable of independent living General fund of knowledge Motivation for treatment/growth Physical Health   PROBLEM LIST: Problem List/Patient Goals Date to be addressed Date deferred Reason deferred Estimated date of resolution  Depression 06/11/2015     "to get back on my medications" 06/11/2015     "to feel better" 06/11/2015     anxiety      Suicide Risk                               DISCHARGE CRITERIA:  Ability to meet basic life and health needs Adequate post-discharge living arrangements Improved stabilization in mood, thinking, and/or behavior Motivation to continue treatment in a less acute level of care Verbal commitment to aftercare and medication compliance  PRELIMINARY DISCHARGE PLAN: Return to previous living arrangement  PATIENT/FAMIILY INVOLVEMENT: This treatment plan has been presented to and reviewed with the patient, Patty Valdez, Patty Valdez 06/11/2015, 7:14 PM
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
## 2015-06-12 DIAGNOSIS — R45851 Suicidal ideations: Secondary | ICD-10-CM

## 2015-06-12 MED ORDER — CLONAZEPAM 0.5 MG PO TABS
0.2500 mg | ORAL_TABLET | Freq: Two times a day (BID) | ORAL | Status: AC | PRN
  Administered 2015-06-12 (×2): 0.25 mg via ORAL
  Filled 2015-06-12 (×2): qty 1

## 2015-06-12 MED ORDER — NAPROXEN 375 MG PO TABS
375.0000 mg | ORAL_TABLET | Freq: Two times a day (BID) | ORAL | Status: DC
  Administered 2015-06-12 – 2015-06-15 (×7): 375 mg via ORAL
  Filled 2015-06-12 (×12): qty 1

## 2015-06-12 MED ORDER — CITALOPRAM HYDROBROMIDE 20 MG PO TABS
20.0000 mg | ORAL_TABLET | Freq: Every day | ORAL | Status: DC
  Administered 2015-06-12 – 2015-06-14 (×3): 20 mg via ORAL
  Filled 2015-06-12 (×5): qty 1
  Filled 2015-06-12: qty 3

## 2015-06-12 MED ORDER — HYDROXYZINE HCL 25 MG PO TABS
25.0000 mg | ORAL_TABLET | Freq: Three times a day (TID) | ORAL | Status: DC | PRN
  Administered 2015-06-12 – 2015-06-13 (×2): 25 mg via ORAL
  Filled 2015-06-12 (×2): qty 1

## 2015-06-12 NOTE — Progress Notes (Signed)
Adult Psychoeducational Group Note  Date:  06/12/2015 Time:  9:59 PM  Group Topic/Focus:  Wrap-Up Group:   The focus of this group is to help patients review their daily goal of treatment and discuss progress on daily workbooks.  Participation Level:  Active  Participation Quality:  Appropriate  Affect:  Appropriate  Cognitive:  Appropriate  Insight: Appropriate  Engagement in Group:  Engaged  Modes of Intervention:  Discussion  Additional Comments:  Patient rated her day 5. Says her goal is to get back on her medicine.  Natasha Mead 06/12/2015, 9:59 PM

## 2015-06-12 NOTE — H&P (Signed)
Psychiatric Admission Assessment Adult  Patient Identification: Patty Valdez MRN:  [id number] Date of Evaluation:  06/12/2015 Chief Complaint:  MDD Principal Diagnosis: <principal problem not specified> Diagnosis:   Patient Active Problem List   Diagnosis Date Noted  . MDD (major depressive disorder), recurrent severe, without psychosis [F33.2] 06/11/2015  . Pregnancy [Z33.1] 05/15/2015  . S/P cesarean section [Z98.89] 05/15/2015  . [redacted] weeks gestation of pregnancy [Z3A.27]   . Abnormal fetal ultrasound [O28.9]   . Encounter for fetal anatomic survey [Z36]   . Low lying placenta without hemorrhage, antepartum [O44.00]   . [redacted] weeks gestation of pregnancy [Z3A.21]   . MDD (major depressive disorder), recurrent episode, severe [F33.2] 06/14/2014  . Drug overdose [T50.901A] 06/11/2014  . Decreased level of consciousness [R40.4] 06/11/2014  . Hypotension due to drugs [I95.2] 06/11/2014  . Suicide attempt by drug ingestion [T50.902A] 06/11/2014  . Poisoning by carbamazepine [T42.1X1A] 06/11/2014  . Chronic cholecystitis with calculus [K80.10] 01/08/2012  . IRREGULAR MENSES [N92.6] 08/11/2009  . OBESITY, UNSPECIFIED [E66.9] 07/15/2009  . ANXIETY STATE, UNSPECIFIED [F41.1] 07/15/2009  . HERPES SIMPLEX INFECTION [B00.9] 04/20/2008  . NICOTINE ADDICTION [Z72.0] 04/13/2008   History of Present Illness: Patty Valdez is an 29 y.o. female who came to [facility] as a walk in wanted to get help with her depression.  She stated wanting to get help.  Her family was also concerned and encouraged her to get help.  She verbalized,  "I don't want to be here anymore, and I want to get help before I do anything to hurt myself".  She is a mother of 3 kids with the youngest at 66 weeks old.  She is married.  Patient states that she has a lot of conflict with her husband, and she feels like they "don't have anything" due to financial problems. She says she is taking Klonopin and Zoloft as directed, but has no  Op providers at this time since she was asked to stop this meds until she had delivered her baby.  She had a past admission at [facility] in 2015 due to an overdose and has 2 total past suicide attempts--the overdose and an attempt to cut her wrists.  Her affect was flat, calm and pleasant.  Pending labs ordered.  Elements:  Location:  depression. Quality:  hopeless, anxiety. Timing:  yesterday. Context:  see HPI. Associated Signs/Symptoms: Depression Symptoms:  depressed mood, fatigue, hopelessness, anxiety, (Hypo) Manic Symptoms:  Irritable Mood, Labiality of Mood, Anxiety Symptoms:  Excessive Worry, Psychotic Symptoms:  NA PTSD Symptoms: NA Total Time spent with patient: 30 minutes  Past Medical History:  Past Medical History  Diagnosis Date  . Gallstones   . Nausea   . Abdominal pain   . Vaginal bleeding   . Anemia 10 yrs ago  . Anxiety   . Genital herpes     no current meds for  . Complication of anesthesia 04-08-2009    low blood pressure after epidural, had to take iv meds for  . Migraine   . Obesity     Past Surgical History  Procedure Laterality Date  . No past surgeries    . Cholecystectomy    . Cesarean section N/A 05/15/2015    Procedure: CESAREAN SECTION;  Surgeon: Kathreen Cosier, MD;  Location: [facility];  Service: Obstetrics;  Laterality: N/A;   Family History:  Family History  Problem Relation Age of Onset  . Cancer Maternal Grandfather     prostate  . Hypertension Mother   .  Diabetes Mother   . Neuropathy Mother    Social History:  History  Alcohol Use No    Comment: last drink was 1 yr ago     History  Drug Use  . Yes  . Special: Marijuana    Comment: last used a year ago as of 05/13/2015    History   Social History  . Marital Status: Married    Spouse Name: N/A  . Number of Children: N/A  . Years of Education: N/A   Social History Main Topics  . Smoking status: Current Every Day Smoker -- 0.50 packs/day for 10 years    Types:  Cigarettes  . Smokeless tobacco: Never Used  . Alcohol Use: No     Comment: last drink was 1 yr ago  . Drug Use: Yes    Special: Marijuana     Comment: last used a year ago as of 05/13/2015  . Sexual Activity: Yes    Birth Control/ Protection: None   Other Topics Concern  . None   Social History Narrative   Additional Social History:    Pain Medications: denies Prescriptions: denies Over the Counter: denies History of alcohol / drug use?: Yes Longest period of sobriety (when/how long): denies Negative Consequences of Use:  (denies) Withdrawal Symptoms:  (denies)   Musculoskeletal: Strength & Muscle Tone: within normal limits Gait & Station: normal Patient leans: N/A  Psychiatric Specialty Exam: Physical Exam  Vitals reviewed. Psychiatric: Her mood appears anxious. She exhibits a depressed mood.    Review of Systems  Psychiatric/Behavioral: Positive for depression. Negative for suicidal ideas. The patient is nervous/anxious.   All other systems reviewed and are negative.   Blood pressure 106/66, pulse 70, temperature 97.8 F (36.6 C), temperature source Oral, resp. rate 18, height 5\' 3"  (1.6 m), weight 79.379 kg (175 lb), unknown if currently breastfeeding.Body mass index is 31.01 kg/(m^2).   General Appearance: Casual  Eye Contact:: Good  Speech: Clear and Coherent  Volume: Decreased  Mood: Anxious and Depressed  Affect: Constricted and Tearful  Thought Process: Goal Directed  Orientation: Full (Time, Place, and Person)  Thought Content: Negative  Suicidal Thoughts: Yes. without intent/plan  Homicidal Thoughts: No  Memory: Immediate; Good Recent; Good Remote; Good  Judgement: Impaired  Insight: Shallow  Psychomotor Activity: Restlessness  Concentration: Fair  Recall: Good  Fund of Knowledge:Good  Language: Good  Akathisia: No  Handed: Right  AIMS (if indicated):    Assets: Communication Skills Desire  for Improvement Housing Physical Health Social Support  Sleep: Number of Hours: 6.75  Cognition: WNL  ADL's: Intact       Risk to Self: Suicidal Ideation: Yes-Currently Present Suicidal Intent: No Is patient at risk for suicide?: Yes Suicidal Plan?: No Access to Means: Yes Specify Access to Suicidal Means: pills What has been your use of drugs/alcohol within the last 12 months?: denies How many times?: 2 Other Self Harm Risks: none known Triggers for Past Attempts:  (depression) Intentional Self Injurious Behavior: Cutting Comment - Self Injurious Behavior:  (hx of cutting behavior) Risk to Others: Homicidal Ideation: No Thoughts of Harm to Others: No Current Homicidal Intent: No Current Homicidal Plan: No Access to Homicidal Means: No History of harm to others?: No Assessment of Violence: None Noted Does patient have access to weapons?: No Criminal Charges Pending?: No Does patient have a court date: No Prior Inpatient Therapy: Prior Inpatient Therapy: Yes Prior Therapy Dates: 2015 Prior Therapy Facilty/Provider(s): [facility] Reason for Treatment: depression Prior Outpatient  Therapy: Prior Outpatient Therapy: No Does patient have an ACCT team?: No Does patient have Intensive In-House Services?  : No Does patient have [facility] services? : No Does patient have P4CC services?: No  Alcohol Screening: 1. How often do you have a drink containing alcohol?: Monthly or less 2. How many drinks containing alcohol do you have on a typical day when you are drinking?: 1 or 2 3. How often do you have six or more drinks on one occasion?: Never Preliminary Score: 0 9. Have you or someone else been injured as a result of your drinking?: Yes, but not in the last year 10. Has a relative or friend or a doctor or another health worker been concerned about your drinking or suggested you cut down?: No Alcohol Use Disorder Identification Test Final Score (AUDIT): 3  Allergies:  No Known  Allergies Lab Results: No results found for this or any previous visit (from the past 48 hour(s)). Current Medications: Current Facility-Administered Medications  Medication Dose Route Frequency Provider Last Rate Last Dose  . acetaminophen (TYLENOL) tablet 650 mg  650 mg Oral Q6H PRN Kristeen Mans, NP   650 mg at 06/12/15 0831  . alum & mag hydroxide-simeth (MAALOX/MYLANTA) 200-200-20 MG/5ML suspension 30 mL  30 mL Oral Q4H PRN Kristeen Mans, NP      . magnesium hydroxide (MILK OF MAGNESIA) suspension 30 mL  30 mL Oral Daily PRN Kristeen Mans, NP      . naproxen (NAPROSYN) tablet 375 mg  375 mg Oral BID WC Abdishakur Gottschall      . nicotine (NICODERM CQ - dosed in mg/24 hours) patch 21 mg  21 mg Transdermal Daily Shuvon B Rankin, NP   21 mg at 06/12/15 1610   PTA Medications: Prescriptions prior to admission  Medication Sig Dispense Refill Last Dose  . nitrofurantoin, macrocrystal-monohydrate, (MACROBID) 100 MG capsule Take 1 capsule (100 mg total) by mouth 2 (two) times daily. X 7 days 14 capsule 0   . ondansetron (ZOFRAN ODT) 4 MG disintegrating tablet 4mg  ODT q4 hours prn nausea/vomit 10 tablet 0   . oxyCODONE-acetaminophen (PERCOCET) 5-325 MG per tablet Take 1 tablet by mouth every 4 (four) hours as needed for severe pain. 30 tablet 0   . valACYclovir (VALTREX) 500 MG tablet Take 1 tablet by mouth 2 (two) times daily as needed. breakouts  3 unknown    Previous Psychotropic Medications: Yes   Substance Abuse History in the last 12 months:  Yes.    Consequences of Substance Abuse: NA  No results found for this or any previous visit (from the past 72 hour(s)).  Observation Level/Precautions:  15 minute checks  Laboratory:  per ED  Psychotherapy:  group  Medications:  As per medlist  Consultations:  As needed  Discharge Concerns:  safety  Estimated LOS:  5-7 days  Other:     Psychological Evaluations: Yes   Treatment Plan Summary: Admit for crisis management and mood  stabilization. Medication management to re-stabilize current mood symptoms Group counseling sessions for coping skills Medical consults as needed Review and reinstate any pertinent home medications for other health problems   Medical Decision Making:  Review of Psycho-Social Stressors (1), Discuss test with performing physician (1), Review and summation of old records (2) and Review of New Medication or Change in Dosage (2)  I certify that inpatient services furnished can reasonably be expected to improve the patient's condition.   Velna Hatchet May Agustin AGNP-BC 7/24/201612:18 PM Patient seen  face-to-face for psychiatric evaluation, chart reviewed and case discussed with the physician extender and developed treatment plan. Reviewed the information documented and agree with the treatment plan. Corena Pilgrim, MD
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
## 2015-06-12 NOTE — Progress Notes (Signed)
Pt presents with depressed mood, affect congruent. She reports '' I was feeling depressed and that my family was just trying to push me away and get rid of me. I'd like to add then to come see me.  I was taking klonopin and zoloft before I got pregnant and that helped me. '' Pt also complained of pain to her incision site and reports I was taking the percocet 5 for it'' I went back to the ED because I ran out of percocet and it looked like it was opening up and it was bothering me. '' patient continued to complain of pain today with incision, prn tylenol given and discussed with Dr. Jannifer Franklin. Order received for naproxen  BID WC . Pt incision assessed and is clean dry intact and does not appear infected. Pt tearful throughout shift today, isolative to her room but has been cooperative with staff. In no acute distress at this time. Will continue to monitor q15 minutes as ordered.

## 2015-06-12 NOTE — B[facility] Group Notes (Signed)
[facility] Group Notes:  (Nursing/MHT/Case Management/Adjunct)  Date:  06/12/2015  Time:  10:49 AM  Type of Therapy:  Psychoeducational Skills  Participation Level:  Did Not Attend  Participation Quality:  na  Affect:  na  Cognitive:  na  Insight:  None  Engagement in Group:  na  Modes of Intervention:  na  Summary of Progress/Problems:  Malva Limes 06/12/2015, 10:49 AM
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
## 2015-06-12 NOTE — [facility] Group Notes (Signed)
[facility] LCSW Group Therapy  06/12/2015 1:15 PM  Type of Therapy:  Group Therapy  Participation Level:  Minimal  Participation Quality:  Attentive  Affect:  Depressed  Cognitive:  Alert and Oriented  Insight:  Developing/Improving  Engagement in Therapy:  Limited  Modes of Intervention:  Discussion, Education, Exploration, Socialization and Support  Summary of Progress/Problems: The main focus of today's process group was to identify the patient's current support system and decide on other supports that can be put in place. An emphasis was placed on using counselor, doctor, therapy groups, 12-step groups, and problem-specific support groups to expand supports. There was also an extensive discussion about what constitutes a healthy support versus an unhealthy support. Patty Valdez was attentive to group process especially when another patient became angry as evidenced by her eye contact and body language. Patient shared that she doesn't often speak of her needs and issues and has blocked out ("I put up a wall") her mother and husband who have stated they want to help. Patient reports both feel safe and she feels her own quilt about difficulties has added to her resistance in talking about her concerns. Patient reports willingness to share more with both supports.    Carney Bern, LCSW

## 2015-06-12 NOTE — Progress Notes (Signed)
D: Patient complained of headache of 8/10. Offered and accepted Acetaminophen 650 mg PO PRN for pain. Patient went right back to bed after the meds. Continues to sleep at this time. Will continue to monitor patient every 15 minutes.

## 2015-06-13 DIAGNOSIS — F332 Major depressive disorder, recurrent severe without psychotic features: Principal | ICD-10-CM

## 2015-06-13 LAB — CBC
HEMATOCRIT: 32.7 % — AB (ref 36.0–46.0)
HEMOGLOBIN: 10.5 g/dL — AB (ref 12.0–15.0)
MCH: 29 pg (ref 26.0–34.0)
MCHC: 32.1 g/dL (ref 30.0–36.0)
MCV: 90.3 fL (ref 78.0–100.0)
Platelets: 268 10*3/uL (ref 150–400)
RBC: 3.62 MIL/uL — AB (ref 3.87–5.11)
RDW: 13.5 % (ref 11.5–15.5)
WBC: 6.4 10*3/uL (ref 4.0–10.5)

## 2015-06-13 LAB — COMPREHENSIVE METABOLIC PANEL
ALBUMIN: 3.4 g/dL — AB (ref 3.5–5.0)
ALT: 30 U/L (ref 14–54)
AST: 28 U/L (ref 15–41)
Alkaline Phosphatase: 118 U/L (ref 38–126)
Anion gap: 5 (ref 5–15)
BUN: 11 mg/dL (ref 6–20)
CHLORIDE: 105 mmol/L (ref 101–111)
CO2: 24 mmol/L (ref 22–32)
Calcium: 8.4 mg/dL — ABNORMAL LOW (ref 8.9–10.3)
Creatinine, Ser: 0.59 mg/dL (ref 0.44–1.00)
GFR calc Af Amer: >60 mL/min (ref >60)
GFR calc non Af Amer: >60 mL/min (ref >60)
Glucose, Bld: 111 mg/dL — ABNORMAL HIGH (ref 65–99)
Potassium: 4 mmol/L (ref 3.5–5.1)
SODIUM: 134 mmol/L — AB (ref 135–145)
Total Protein: 7 g/dL (ref 6.5–8.1)

## 2015-06-13 LAB — TSH: TSH: 0.61 u[IU]/mL (ref 0.350–4.500)

## 2015-06-13 MED ORDER — HYDROXYZINE HCL 25 MG PO TABS
25.0000 mg | ORAL_TABLET | Freq: Four times a day (QID) | ORAL | Status: DC | PRN
  Administered 2015-06-13 – 2015-06-15 (×4): 25 mg via ORAL
  Filled 2015-06-13 (×2): qty 1
  Filled 2015-06-13: qty 12
  Filled 2015-06-13 (×2): qty 1

## 2015-06-13 NOTE — Progress Notes (Signed)
D:  Patient's self inventory sheet, patient sleeps good, no sleep medication administered.  Good appetite, normal energy level, good concentration.  Rated depression and anxiety 5, hopeless 3.  Denied SI.  Physical problems, pain, C-section, worst pain #5 in past 24 hours.  Goal is to be stable on medications that work for her and be discharged.  Goal is to take medications and participate in groups.  Would like to talk to [facility] for appointment.  No discharge plans. A:  Medications administered per MD orders.  Emotional support and encouragement given patient. R:  Denied SI and Hi, contracts for safety.  Safety maintained with 15 minute checks.

## 2015-06-13 NOTE — [facility] Suicide Risk Assessment (Signed)
[facility] INPATIENT:  Family/Significant Other Suicide Prevention Education  Suicide Prevention Education:  Education Completed; Shuntay Everetts, friend, [phone] ,  (name of family member/significant other) has been identified by the patient as the family member/significant other with whom the patient will be residing, and identified as the person(s) who will aid the patient in the event of a mental health crisis (suicidal ideations/suicide attempt).  With written consent from the patient, the family member/significant other has been provided the following suicide prevention education, prior to the and/or following the discharge of the patient.  The suicide prevention education provided includes the following:  Suicide risk factors  Suicide prevention and interventions  National Suicide Hotline telephone number  [facility] assessment telephone number  [facility] Emergency Assistance 911  [facility] and/or Residential Mobile Crisis Unit telephone number  Request made of family/significant other to:  Remove weapons (e.g., guns, rifles, knives), all items previously/currently identified as safety concern.    Remove drugs/medications (over-the-counter, prescriptions, illicit drugs), all items previously/currently identified as a safety concern.  The family member/significant other verbalizes understanding of the suicide prevention education information provided.  The family member/significant other agrees to remove the items of safety concern listed above.  No firearms in the home and patient does not have access to unneeded medications, no questions/concerns expressed by Mr Spicer.   Sallee Lange 06/13/2015, 5:22 PM

## 2015-06-13 NOTE — Progress Notes (Signed)
[facility] MD Progress Note  06/13/2015 6:09 PM Patty Valdez  MRN:  [id number] Subjective:  Patient states she is feeling better- still depressed, but better than upon admission. Denies any current SI.  Objective : I have discussed case with treatment team and have met with patient. Patient is a 29 year old female, with history of depression, who presents with increased depression . She is three weeks post partum ( full term - C section) . Baby boy is now with family. States she feels a strong sense of bonding with her child and is hoping for discharge soon so she can see him. States she has had prior episodes of depression. Of note, states she does not intend to breast feed . Patient has been visible on unit and has been going to groups- she has reported improved mood , but staff has noted ongoing constricted affect. She has denied any current SI and has not exhibited any agitated or self injurious behaviors on unit. Currently denies medication side effects. TSH WNL- Anemia stable compared to prior CBC.   Principal Problem: MDD (major depressive disorder), recurrent severe, without psychosis Diagnosis:   Patient Active Problem List   Diagnosis Date Noted  . MDD (major depressive disorder), recurrent severe, without psychosis [F33.2] 06/11/2015  . Pregnancy [Z33.1] 05/15/2015  . S/P cesarean section [Z98.89] 05/15/2015  . [redacted] weeks gestation of pregnancy [Z3A.27]   . Abnormal fetal ultrasound [O28.9]   . Encounter for fetal anatomic survey [Z36]   . Low lying placenta without hemorrhage, antepartum [O44.00]   . [redacted] weeks gestation of pregnancy [Z3A.21]   . MDD (major depressive disorder), recurrent episode, severe [F33.2] 06/14/2014  . Drug overdose [T50.901A] 06/11/2014  . Decreased level of consciousness [R40.4] 06/11/2014  . Hypotension due to drugs [I95.2] 06/11/2014  . Suicide attempt by drug ingestion [T50.902A] 06/11/2014  . Poisoning by carbamazepine [T42.1X1A] 06/11/2014  . Chronic  cholecystitis with calculus [K80.10] 01/08/2012  . IRREGULAR MENSES [N92.6] 08/11/2009  . OBESITY, UNSPECIFIED [E66.9] 07/15/2009  . ANXIETY STATE, UNSPECIFIED [F41.1] 07/15/2009  . HERPES SIMPLEX INFECTION [B00.9] 04/20/2008  . NICOTINE ADDICTION [Z72.0] 04/13/2008   Total Time spent with patient: 25 minutes    Past Medical History:  Past Medical History  Diagnosis Date  . Gallstones   . Nausea   . Abdominal pain   . Vaginal bleeding   . Anemia 10 yrs ago  . Anxiety   . Genital herpes     no current meds for  . Complication of anesthesia 04-08-2009    low blood pressure after epidural, had to take iv meds for  . Migraine   . Obesity     Past Surgical History  Procedure Laterality Date  . No past surgeries    . Cholecystectomy    . Cesarean section N/A 05/15/2015    Procedure: CESAREAN SECTION;  Surgeon: Frederico Hamman, MD;  Location: [facility];  Service: Obstetrics;  Laterality: N/A;   Family History:  Family History  Problem Relation Age of Onset  . Cancer Maternal Grandfather     prostate  . Hypertension Mother   . Diabetes Mother   . Neuropathy Mother    Social History:  History  Alcohol Use No    Comment: last drink was 1 yr ago     History  Drug Use  . Yes  . Special: Marijuana    Comment: last used a year ago as of 05/13/2015    History   Social History  . Marital  Status: Married    Spouse Name: N/A  . Number of Children: N/A  . Years of Education: N/A   Social History Main Topics  . Smoking status: Current Every Day Smoker -- 0.50 packs/day for 10 years    Types: Cigarettes  . Smokeless tobacco: Never Used  . Alcohol Use: No     Comment: last drink was 1 yr ago  . Drug Use: Yes    Special: Marijuana     Comment: last used a year ago as of 05/13/2015  . Sexual Activity: Yes    Birth Control/ Protection: None   Other Topics Concern  . None   Social History Narrative   Additional History:    Sleep:  Improved   Appetite:    improved   Assessment:   Musculoskeletal: Strength & Muscle Tone: within normal limits Gait & Station: normal Patient leans: N/A   Psychiatric Specialty Exam: Physical Exam  ROS- denies fever, denies chills, states C section surgical wound still tender but denies supuration or active bleeding   Blood pressure 117/89, pulse 71, temperature 98.1 F (36.7 C), temperature source Oral, resp. rate 16, height '5\' 3"'  (1.6 m), weight 175 lb (79.379 kg), unknown if currently breastfeeding.Body mass index is 31.01 kg/(m^2).  General Appearance: Fairly Groomed  Engineer, water::  Good  Speech:  Normal Rate  Volume:  Decreased  Mood:  Depressed- but states she is feeling better   Affect:  Appropriate- slightly constricted but reactive   Thought Process:  Linear  Orientation:  Full (Time, Place, and Person)  Thought Content:  denies hallucinations,no delusions, does not appear internally preoccupied   Suicidal Thoughts:  No- currently denies any self injurious ideations or any SI  Homicidal Thoughts:  No- also , specifically denies any thoughts of hurting her child  Memory:  recent and remote grossly intact   Judgement:  Fair  Insight:  Present  Psychomotor Activity:  Normal  Concentration:  Good  Recall:  Good  Fund of Knowledge:Good  Language: Good  Akathisia:  Negative  Handed:  Right  AIMS (if indicated):     Assets:  Communication Skills Desire for Improvement Physical Health Resilience  ADL's: improving   Cognition: WNL  Sleep:  Number of Hours: 6.75     Current Medications: Current Facility-Administered Medications  Medication Dose Route Frequency Provider Last Rate Last Dose  . acetaminophen (TYLENOL) tablet 650 mg  650 mg Oral Q6H PRN Lurena Nida, NP   650 mg at 06/12/15 0831  . alum & mag hydroxide-simeth (MAALOX/MYLANTA) 200-200-20 MG/5ML suspension 30 mL  30 mL Oral Q4H PRN Lurena Nida, NP      . citalopram (CELEXA) tablet 20 mg  20 mg Oral QHS Mojeed Akintayo   20  mg at 06/12/15 2141  . hydrOXYzine (ATARAX/VISTARIL) tablet 25 mg  25 mg Oral Q6H PRN Jenne Campus, MD   25 mg at 06/13/15 1713  . magnesium hydroxide (MILK OF MAGNESIA) suspension 30 mL  30 mL Oral Daily PRN Lurena Nida, NP      . naproxen (NAPROSYN) tablet 375 mg  375 mg Oral BID WC Mojeed Akintayo   375 mg at 06/13/15 1711  . nicotine (NICODERM CQ - dosed in mg/24 hours) patch 21 mg  21 mg Transdermal Daily Shuvon B Rankin, NP   21 mg at 06/13/15 5035    Lab Results:  Results for orders placed or performed during the hospital encounter of 06/11/15 (from the past 48 hour(s))  CBC     Status: Abnormal   Collection Time: 06/13/15  6:30 AM  Result Value Ref Range   WBC 6.4 4.0 - 10.5 K/uL   RBC 3.62 (L) 3.87 - 5.11 MIL/uL   Hemoglobin 10.5 (L) 12.0 - 15.0 g/dL   HCT 32.7 (L) 36.0 - 46.0 %   MCV 90.3 78.0 - 100.0 fL   MCH 29.0 26.0 - 34.0 pg   MCHC 32.1 30.0 - 36.0 g/dL   RDW 13.5 11.5 - 15.5 %   Platelets 268 150 - 400 K/uL    Comment: Performed at [facility]  Comprehensive metabolic panel     Status: Abnormal   Collection Time: 06/13/15  6:30 AM  Result Value Ref Range   Sodium 134 (L) 135 - 145 mmol/L   Potassium 4.0 3.5 - 5.1 mmol/L   Chloride 105 101 - 111 mmol/L   CO2 24 22 - 32 mmol/L   Glucose, Bld 111 (H) 65 - 99 mg/dL   BUN 11 6 - 20 mg/dL   Creatinine, Ser 0.59 0.44 - 1.00 mg/dL   Calcium 8.4 (L) 8.9 - 10.3 mg/dL   Total Protein 7.0 6.5 - 8.1 g/dL   Albumin 3.4 (L) 3.5 - 5.0 g/dL   AST 28 15 - 41 U/L   ALT 30 14 - 54 U/L   Alkaline Phosphatase 118 38 - 126 U/L   Total Bilirubin <0.1 (L) 0.3 - 1.2 mg/dL   GFR calc non Af Amer >60 >60 mL/min   GFR calc Af Amer >60 >60 mL/min    Comment: (NOTE) The eGFR has been calculated using the CKD EPI equation. This calculation has not been validated in all clinical situations. eGFR's persistently <60 mL/min signify possible Chronic Kidney Disease.    Anion gap 5 5 - 15    Comment: Performed at  [facility]  TSH     Status: None   Collection Time: 06/13/15  6:30 AM  Result Value Ref Range   TSH 0.610 0.350 - 4.500 uIU/mL    Comment: Performed at [facility]    Physical Findings: AIMS: Facial and Oral Movements Muscles of Facial Expression: None, normal Lips and Perioral Area: None, normal Jaw: None, normal Tongue: None, normal,Extremity Movements Upper (arms, wrists, hands, fingers): None, normal Lower (legs, knees, ankles, toes): None, normal, Trunk Movements Neck, shoulders, hips: None, normal, Overall Severity Severity of abnormal movements (highest score from questions above): None, normal Incapacitation due to abnormal movements: None, normal Patient's awareness of abnormal movements (rate only patient's report): No Awareness, Dental Status Current problems with teeth and/or dentures?: No Does patient usually wear dentures?: No  CIWA:  CIWA-Ar Total: 1 COWS:  COWS Total Score: 1   Assessment- patient remains depressed, but states she is feeling better and is hoping for discharge soon so she can reunite with her family, child. She is currently denying any SI. She is tolerating medications well. At this time no presentation of psychosis.   Treatment Plan Summary: Daily contact with patient to assess and evaluate symptoms and progress in treatment, Medication management, Plan continue inpatient treatment and medications as below Celexa 20 mgrs QDAY for depression. Tylenol PRNs for pain , if needed  Vistaril 25 mgrs Q 6 hours  PRNs for Anxiety, Agitation Encourage Milieu participation.    Medical Decision Making:  Established Problem, Stable/Improving (1), Review of Psycho-Social Stressors (1), Review or order clinical lab tests (1) and Review of Medication Regimen & Side  Effects (2)     COBOS, FERNANDO 06/13/2015, 6:09 PM

## 2015-06-13 NOTE — Progress Notes (Signed)
D: Patient seen watching TV on dayroom and socializing with peers. More cheerful and interactive. When asked about her condition, patient stated "SO-SO. Every thing's the same". Patient endorses depression and anxiety which she rated 8/10 and 5/10 respectively. Denies SI, AH/VH at this time. A: Support and encouragement offered to patient. Due medications given as ordered. Every 15 minutes check for safety maintained. Will continue to monitor patient for stability.  R: Patient remains safe.

## 2015-06-13 NOTE — Progress Notes (Signed)
[facility] Group Notes:  (Nursing/MHT/Case Management/Adjunct)  Date:  06/13/2015  Time:  10:24 PM  Type of Therapy:  Psychoeducational Skills  Participation Level:  Minimal  Participation Quality:  Appropriate  Affect:  Appropriate  Cognitive:  Appropriate  Insight:  Limited  Engagement in Group:  Engaged  Modes of Intervention:  Discussion  Summary of Progress/Problems: Tonight in wrap up group Patty Valdez said that her day was a 5, she said she is ready to leave but got the news she would be d/c soon. Madaline Savage 06/13/2015, 10:24 PM

## 2015-06-13 NOTE — Plan of Care (Signed)
Problem: Consults Goal: Suicide Risk Patient Education (See Patient Education module for education specifics)  Outcome: Completed/Met Date Met:  06/13/15 Nurse discussed suicidal thoughts/coping skills with patient.

## 2015-06-13 NOTE — [facility] Group Notes (Signed)
[facility] LCSW Group Therapy  06/13/2015 1:15pm  Type of Therapy:  Group Therapy vercoming Obstacles  Participation Level:  Minimal  Participation Quality:  Reserved  Affect:  Flat  Cognitive:  Appropriate and Oriented  Insight:  Developing/Improving and Improving  Engagement in Therapy:  Improving  Modes of Intervention:  Discussion, Exploration, Problem-solving and Support  Description of Group:   In this group patients will be encouraged to explore what they see as obstacles to their own wellness and recovery. They will be guided to discuss their thoughts, feelings, and behaviors related to these obstacles. The group will process together ways to cope with barriers, with attention given to specific choices patients can make. Each patient will be challenged to identify changes they are motivated to make in order to overcome their obstacles. This group will be process-oriented, with patients participating in exploration of their own experiences as well as giving and receiving support and challenge from other group members.  Summary of Patient Progress: Pt did not participate verbally in group therapy, however remained attentive to discussion AEB maintained eye contact and non-verbal affirming gestures.   Therapeutic Modalities:   Cognitive Behavioral Therapy Solution Focused Therapy Motivational Interviewing Relapse Prevention Therapy   Chad Cordial, LCSWA 06/13/2015 4:22 PM

## 2015-06-13 NOTE — [facility] Group Notes (Signed)
[facility] LCSW Aftercare Discharge Planning Group Note  06/13/2015 8:45 AM  Participation Quality: Alert, Appropriate and Oriented  Mood/Affect: Flat  Depression Rating: 2  Anxiety Rating: 3  Thoughts of Suicide: Pt denies SI/HI  Will you contract for safety? Yes  Current AVH: Pt denies  Plan for Discharge/Comments: Pt attended discharge planning group and actively participated in group. CSW discussed suicide prevention education with the group and encouraged them to discuss discharge planning and any relevant barriers. Pt presented with flat affect and depressed mood, however reporting low ratings of depression and anxiety. Pt requests that her initial intake be cancelled at [facility] that was scheduled for 7/26.  Transportation Means: Pt reports access to transportation  Supports: No supports mentioned at this time  Chad Cordial, LCSWA 06/13/2015 1:05 PM

## 2015-06-13 NOTE — Progress Notes (Signed)
Recreation Therapy Notes  Date: 07.25.16 Time: 9:30 am Location: [address] Group Room  Group Topic: Stress Management  Goal Area(s) Addresses:  Patient will verbalize importance of using healthy stress management.  Patient will identify positive emotions associated with healthy stress management.   Intervention: Stress Management  Activity :  Progressive Muscle Relaxation.  LRT introduced and educated patients on stress management technique of progressive muscle relaxation.  A script was used to deliver the technique to patients.  Patients were asked to follow script read allowed by LRT to engage in practicing the stress management technique.  Education:  Stress Management, Discharge Planning.   Education Outcome: Acknowledges edcuation/In group clarification offered/Needs additional education  Clinical Observations/Feedback: Patient did not attend group.   Caroll Rancher, LRT/CTRS         Lillia Abed, Natacha Jepsen A 06/13/2015 1:22 PM

## 2015-06-14 NOTE — Plan of Care (Signed)
Problem: Consults Goal: Depression Patient Education See Patient Education Module for education specifics.  Outcome: Progressing Nurse discussed depression/coping skills with patient.        

## 2015-06-14 NOTE — Progress Notes (Signed)
Patient ID: Patty Valdez, female   DOB: [birth year], 29 y.o.   MRN: [id number] [facility] MD Progress Note  06/14/2015 5:32 PM Patty Valdez  MRN:  [id number] Subjective:   Patient reports she is better, less depressed, less sad. States " I really want to be discharged soon,  I am missing my baby a lot ". Denies medication side effects. Objective : I have discussed case with treatment team and have met with patient. Saff reports patient is partially improved, more focused on discharge soon. She states she is looking forward to visit from her husband and child later today.  She states she is feeling better, less depressed, with an improved sense of energy level. Today has rated depression and anxiety as lower. She denies any SI. Patient remains visible on unit and  Active in milieu Currently denies medication side effects. At this time states that wound pain ( on C section site ) is improved and lessening.    Principal Problem: MDD (major depressive disorder), recurrent severe, without psychosis Diagnosis:   Patient Active Problem List   Diagnosis Date Noted  . MDD (major depressive disorder), recurrent severe, without psychosis [F33.2] 06/11/2015  . Pregnancy [Z33.1] 05/15/2015  . S/P cesarean section [Z98.89] 05/15/2015  . [redacted] weeks gestation of pregnancy [Z3A.27]   . Abnormal fetal ultrasound [O28.9]   . Encounter for fetal anatomic survey [Z36]   . Low lying placenta without hemorrhage, antepartum [O44.00]   . [redacted] weeks gestation of pregnancy [Z3A.21]   . MDD (major depressive disorder), recurrent episode, severe [F33.2] 06/14/2014  . Drug overdose [T50.901A] 06/11/2014  . Decreased level of consciousness [R40.4] 06/11/2014  . Hypotension due to drugs [I95.2] 06/11/2014  . Suicide attempt by drug ingestion [T50.902A] 06/11/2014  . Poisoning by carbamazepine [T42.1X1A] 06/11/2014  . Chronic cholecystitis with calculus [K80.10] 01/08/2012  . IRREGULAR MENSES [N92.6] 08/11/2009  .  OBESITY, UNSPECIFIED [E66.9] 07/15/2009  . ANXIETY STATE, UNSPECIFIED [F41.1] 07/15/2009  . HERPES SIMPLEX INFECTION [B00.9] 04/20/2008  . NICOTINE ADDICTION [Z72.0] 04/13/2008   Total Time spent with patient: 20 minutes   Past Medical History:  Past Medical History  Diagnosis Date  . Gallstones   . Nausea   . Abdominal pain   . Vaginal bleeding   . Anemia 10 yrs ago  . Anxiety   . Genital herpes     no current meds for  . Complication of anesthesia 04-08-2009    low blood pressure after epidural, had to take iv meds for  . Migraine   . Obesity     Past Surgical History  Procedure Laterality Date  . No past surgeries    . Cholecystectomy    . Cesarean section N/A 05/15/2015    Procedure: CESAREAN SECTION;  Surgeon: Frederico Hamman, MD;  Location: [facility];  Service: Obstetrics;  Laterality: N/A;   Family History:  Family History  Problem Relation Age of Onset  . Cancer Maternal Grandfather     prostate  . Hypertension Mother   . Diabetes Mother   . Neuropathy Mother    Social History:  History  Alcohol Use No    Comment: last drink was 1 yr ago     History  Drug Use  . Yes  . Special: Marijuana    Comment: last used a year ago as of 05/13/2015    History   Social History  . Marital Status: Married    Spouse Name: N/A  . Number of Children: N/A  . Years  of Education: N/A   Social History Main Topics  . Smoking status: Current Every Day Smoker -- 0.50 packs/day for 10 years    Types: Cigarettes  . Smokeless tobacco: Never Used  . Alcohol Use: No     Comment: last drink was 1 yr ago  . Drug Use: Yes    Special: Marijuana     Comment: last used a year ago as of 05/13/2015  . Sexual Activity: Yes    Birth Control/ Protection: None   Other Topics Concern  . None   Social History Narrative   Additional History:    Sleep:  Improved   Appetite:   improved   Assessment:   Musculoskeletal: Strength & Muscle Tone: within normal limits Gait &  Station: normal Patient leans: N/A   Psychiatric Specialty Exam: Physical Exam  ROS- denies fever, denies chills, states C section surgical wound still tender but denies supuration or active bleeding   Blood pressure 134/94, pulse 76, temperature 98.4 F (36.9 C), temperature source Oral, resp. rate 18, height '5\' 3"'  (1.6 m), weight 175 lb (79.379 kg), unknown if currently breastfeeding.Body mass index is 31.01 kg/(m^2).  General Appearance: improved grooming  Eye Contact::  Good  Speech:  Normal Rate  Volume:  Normal  Mood: less depressed   Affect:  Appropriate- more reactive   Thought Process:  Linear  Orientation:  Full (Time, Place, and Person)  Thought Content:  denies hallucinations,no delusions, does not appear internally preoccupied   Suicidal Thoughts:  No- currently denies any self injurious ideations or any SI  Homicidal Thoughts:  No- also , specifically denies any thoughts of hurting her child  Memory:  recent and remote grossly intact   Judgement:  Fair  Insight:  Present  Psychomotor Activity:  Normal  Concentration:  Good  Recall:  Good  Fund of Knowledge:Good  Language: Good  Akathisia:  Negative  Handed:  Right  AIMS (if indicated):     Assets:  Communication Skills Desire for Improvement Physical Health Resilience  ADL's: improving   Cognition: WNL  Sleep:  Number of Hours: 6.75     Current Medications: Current Facility-Administered Medications  Medication Dose Route Frequency Provider Last Rate Last Dose  . acetaminophen (TYLENOL) tablet 650 mg  650 mg Oral Q6H PRN Lurena Nida, NP   650 mg at 06/13/15 2141  . alum & mag hydroxide-simeth (MAALOX/MYLANTA) 200-200-20 MG/5ML suspension 30 mL  30 mL Oral Q4H PRN Lurena Nida, NP      . citalopram (CELEXA) tablet 20 mg  20 mg Oral QHS Mojeed Akintayo   20 mg at 06/13/15 2141  . hydrOXYzine (ATARAX/VISTARIL) tablet 25 mg  25 mg Oral Q6H PRN Jenne Campus, MD   25 mg at 06/14/15 1640  . magnesium  hydroxide (MILK OF MAGNESIA) suspension 30 mL  30 mL Oral Daily PRN Lurena Nida, NP      . naproxen (NAPROSYN) tablet 375 mg  375 mg Oral BID WC Mojeed Akintayo   375 mg at 06/14/15 1640  . nicotine (NICODERM CQ - dosed in mg/24 hours) patch 21 mg  21 mg Transdermal Daily Shuvon B Rankin, NP   21 mg at 06/14/15 3557    Lab Results:  Results for orders placed or performed during the hospital encounter of 06/11/15 (from the past 48 hour(s))  CBC     Status: Abnormal   Collection Time: 06/13/15  6:30 AM  Result Value Ref Range   WBC 6.4 4.0 -  10.5 K/uL   RBC 3.62 (L) 3.87 - 5.11 MIL/uL   Hemoglobin 10.5 (L) 12.0 - 15.0 g/dL   HCT 32.7 (L) 36.0 - 46.0 %   MCV 90.3 78.0 - 100.0 fL   MCH 29.0 26.0 - 34.0 pg   MCHC 32.1 30.0 - 36.0 g/dL   RDW 13.5 11.5 - 15.5 %   Platelets 268 150 - 400 K/uL    Comment: Performed at [facility]  Comprehensive metabolic panel     Status: Abnormal   Collection Time: 06/13/15  6:30 AM  Result Value Ref Range   Sodium 134 (L) 135 - 145 mmol/L   Potassium 4.0 3.5 - 5.1 mmol/L   Chloride 105 101 - 111 mmol/L   CO2 24 22 - 32 mmol/L   Glucose, Bld 111 (H) 65 - 99 mg/dL   BUN 11 6 - 20 mg/dL   Creatinine, Ser 0.59 0.44 - 1.00 mg/dL   Calcium 8.4 (L) 8.9 - 10.3 mg/dL   Total Protein 7.0 6.5 - 8.1 g/dL   Albumin 3.4 (L) 3.5 - 5.0 g/dL   AST 28 15 - 41 U/L   ALT 30 14 - 54 U/L   Alkaline Phosphatase 118 38 - 126 U/L   Total Bilirubin <0.1 (L) 0.3 - 1.2 mg/dL   GFR calc non Af Amer >60 >60 mL/min   GFR calc Af Amer >60 >60 mL/min    Comment: (NOTE) The eGFR has been calculated using the CKD EPI equation. This calculation has not been validated in all clinical situations. eGFR's persistently <60 mL/min signify possible Chronic Kidney Disease.    Anion gap 5 5 - 15    Comment: Performed at [facility]  TSH     Status: None   Collection Time: 06/13/15  6:30 AM  Result Value Ref Range   TSH 0.610 0.350 - 4.500 uIU/mL     Comment: Performed at [facility]    Physical Findings: AIMS: Facial and Oral Movements Muscles of Facial Expression: None, normal Lips and Perioral Area: None, normal Jaw: None, normal Tongue: None, normal,Extremity Movements Upper (arms, wrists, hands, fingers): None, normal Lower (legs, knees, ankles, toes): None, normal, Trunk Movements Neck, shoulders, hips: None, normal, Overall Severity Severity of abnormal movements (highest score from questions above): None, normal Incapacitation due to abnormal movements: None, normal Patient's awareness of abnormal movements (rate only patient's report): No Awareness, Dental Status Current problems with teeth and/or dentures?: No Does patient usually wear dentures?: No  CIWA:  CIWA-Ar Total: 1 COWS:  COWS Total Score: 1   Assessment- patient improved compared to admission- less depressed, more reactive affect, no SI. At this time focused on being discharged soon. States C section surgical wound healing well- no increased pain or fever. Denies medication side effects- as noted, states she does not intent to breast feed and is aware that her antidepressant would be present in breast milk. Plans to follow up with OB/Gyn after discharge .   Treatment Plan Summary: Daily contact with patient to assess and evaluate symptoms and progress in treatment, Medication management, Plan continue inpatient treatment and medications as below Celexa 20 mgrs QDAY for depression. Tylenol PRNs for pain , if needed  Vistaril 25 mgrs Q 6 hours  PRNs for Anxiety, Agitation Nicoderm patch to address nicotine /cigarette cravings  Consider discharge soon as she continues to improve  Contiinue Milieu participation.    Medical Decision Making:  Established Problem, Stable/Improving (1), Review of  Psycho-Social Stressors (1), Review or order clinical lab tests (1) and Review of Medication Regimen & Side Effects (2)     Alexander Mcauley,  Fantashia Shupert 06/14/2015, 5:32 PM

## 2015-06-14 NOTE — Progress Notes (Signed)
Recreation Therapy Notes  Animal-Assisted Activity (AAA) Program Checklist/Progress Notes Patient Eligibility Criteria Checklist & Daily Group note for Rec Tx Intervention  Date: 07.26.16 Time: 2:45 pm Location: [address]  AAA/T Program Assumption of Risk Form signed by Patient/ or Parent Legal Guardian yes  Patient is free of allergies or sever asthma yes  Patient reports no fear of animals no  Patient reports no history of cruelty to animalsyes  Patient understands his/her participation is voluntary yes  Patient washes hands before animal contact yes  Patient washes hands after animal contact yes  Behavioral Response: Attentive  Education: Hand Washing, Appropriate Animal Interaction   Education Outcome: Acknowledges understanding/In group clarification offered/Needs additional education.   Clinical Observations/Feedback:  Patient attended group.   Caroll Rancher, LRT/CTRS   Caroll Rancher A 06/14/2015 3:55 PM

## 2015-06-14 NOTE — [facility] Group Notes (Signed)
[facility] LCSW Group Therapy 06/14/2015 1:15 PM  Type of Therapy: Group Therapy- Feelings about Diagnosis  Participation Level: Minimal  Participation Quality:  Reserved  Affect:  Flat  Cognitive: Alert and Oriented   Insight:  Limited   Engagement in Therapy: Limited   Modes of Intervention: Clarification, Confrontation, Discussion, Education, Exploration, Limit-setting, Orientation, Problem-solving, Rapport Building, Dance movement psychotherapist, Socialization and Support  Description of Group:   This group will allow patients to explore their thoughts and feelings about diagnoses they have received. Patients will be guided to explore their level of understanding and acceptance of these diagnoses. Facilitator will encourage patients to process their thoughts and feelings about the reactions of others to their diagnosis, and will guide patients in identifying ways to discuss their diagnosis with significant others in their lives. This group will be process-oriented, with patients participating in exploration of their own experiences as well as giving and receiving support and challenge from other group members.   Therapeutic Modalities:   Cognitive Behavioral Therapy Solution Focused Therapy Motivational Interviewing Relapse Prevention Therapy  Chad Cordial, LCSWA 06/14/2015 5:06 PM

## 2015-06-14 NOTE — Progress Notes (Signed)
[facility] Group Notes:  (Nursing/MHT/Case Management/Adjunct)  Date:  06/14/2015  Time:  9:18 PM  Type of Therapy:  Psychoeducational Skills  Participation Level:  Active  Participation Quality:  Appropriate  Affect:  Appropriate  Cognitive:  Appropriate  Insight:  Appropriate  Engagement in Group:  Engaged  Modes of Intervention:  Discussion  Summary of Progress/Problems: Tonight in wrap up group Patty Valdez said that her day was a 7 she was able to see her child and husband. She also was told she would be d/c soon so that made her day. Madaline Savage  06/14/2015, 9:18 PM

## 2015-06-14 NOTE — Progress Notes (Signed)
D. Pt had been up and visible in milieu this evening, did attend and participate in evening group activity. Pt spoke of her day and spoke about it being ok. Pt did appear flat and depressed in the milieu and minimal interaction with staff and peers. Pt has denied any SI this evening and received medications without incident. A. Support and encouragement provided. R. Safety maintained, will continue to monitor.

## 2015-06-14 NOTE — Progress Notes (Signed)
Pt attended spiritual care group on grief and loss facilitated by chaplain Burnis Kingfisher. Group opened with brief discussion and psycho-social ed around grief and loss in relationships and in relation to self - identifying life patterns, circumstances, changes that cause losses. Established group norm of speaking from own life experience. Group goal of establishing open and affirming space for members to share loss and experience with grief, normalize grief experience and provide psycho social education and grief support.  Group drew on narrative and Adlerian therapeutic modalities.    Patty Valdez was present throughout group.  Flat affect, appropriate with peers.  Was actively listening to conversation of group, but did not contribute.  Patty Valdez MDiv

## 2015-06-14 NOTE — [facility] Group Notes (Addendum)
[facility] Group Notes:  (Nursing/MHT/Case Management/Adjunct)  Date:  06/14/2015  Time:  0900  Type of Therapy:  Psychoeducational Skills  Participation Level:  Active  Participation Quality:  Appropriate  Affect:  Appropriate  Cognitive:  Appropriate  Insight:  Appropriate  Engagement in Group:  Supportive  Modes of Intervention:  Discussion  Summary of Progress/Problems:  Patient actively and positively participated in group this morning. The purpose of this group is to discuss the topic of the day which is Recovery. Patients did a Recovery activity and patient's were encouraged to fill it out. Patient was seen looking at the activity sheet and filling it out however did not opt to report what she wrote when writer asked patient's to share if willing.     Patty Valdez 06/14/2015, 9:50 AM

## 2015-06-14 NOTE — Tx Team (Deleted)
[facility] LCSW Group Therapy 06/14/2015 1:15 PM  Type of Therapy: Group Therapy- Feelings about Diagnosis  Participation Level: Minimal  Participation Quality:  Reserved  Affect:  Flat  Cognitive: Alert and Oriented   Insight:  Limited   Engagement in Therapy: Limited   Modes of Intervention: Clarification, Confrontation, Discussion, Education, Exploration, Limit-setting, Orientation, Problem-solving, Rapport Building, Reality Testing, Socialization and Support  Description of Group:   This group will allow patients to explore their thoughts and feelings about diagnoses they have received. Patients will be guided to explore their level of understanding and acceptance of these diagnoses. Facilitator will encourage patients to process their thoughts and feelings about the reactions of others to their diagnosis, and will guide patients in identifying ways to discuss their diagnosis with significant others in their lives. This group will be process-oriented, with patients participating in exploration of their own experiences as well as giving and receiving support and challenge from other group members.   Therapeutic Modalities:   Cognitive Behavioral Therapy Solution Focused Therapy Motivational Interviewing Relapse Prevention Therapy  Rumi Kolodziej [facility], LCSWA 06/14/2015 5:06 PM 

## 2015-06-14 NOTE — Progress Notes (Signed)
D:  Patient's self inventory sheet, patient has fair sleep, no sleep medication given.  Good appetite, normal energy level, good concentration.  Rated depression 2, denied hopeless, anxiety 3.  Denied withdrawals.  Denied SI.  Denied physical problems.  Physical pain, c-section, worst pain #2.  Goal is to plan discharge.  Will discuss discharge plans with MD.  Concerned about newborn baby.  No discharge plans. A:  Medications administered per MD orders.  Emotional support and encouragement given patient. R:  Denied SI and HI, contracts for safety.  Denied A/V hallucinations.  Safety maintained with 15 minute checks.
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
## 2015-06-15 MED ORDER — VALACYCLOVIR HCL 500 MG PO TABS
500.0000 mg | ORAL_TABLET | Freq: Two times a day (BID) | ORAL | Status: DC | PRN
  Filled 2015-06-15 (×2): qty 6

## 2015-06-15 MED ORDER — CITALOPRAM HYDROBROMIDE 20 MG PO TABS: 20.0000 mg | ORAL_TABLET | Freq: Every day | ORAL | Status: DC

## 2015-06-15 MED ORDER — VALACYCLOVIR HCL 500 MG PO TABS: 500.0000 mg | ORAL_TABLET | Freq: Two times a day (BID) | ORAL | Status: DC | PRN

## 2015-06-15 MED ORDER — NICOTINE 21 MG/24HR TD PT24: 21.0000 mg | MEDICATED_PATCH | Freq: Every day | TRANSDERMAL | Status: DC

## 2015-06-15 MED ORDER — HYDROXYZINE HCL 25 MG PO TABS: 25.0000 mg | ORAL_TABLET | Freq: Four times a day (QID) | ORAL | Status: DC | PRN

## 2015-06-15 NOTE — Progress Notes (Signed)
Pt is D/C home. Pt denies SI/HI/AV. Pt follow-up and medications were reviewed and pt verbalized understanding. Pt pleasant and cooperative. Pt belongings were returned.   

## 2015-06-15 NOTE — [facility] Suicide Risk Assessment (Signed)
[facility] Discharge Suicide Risk Assessment   Demographic Factors:  29 year old married female, recently had baby boy ( 4 weeks ago) , lives with husband and children  Total Time spent with patient: 30 minutes  Musculoskeletal: Strength & Muscle Tone: within normal limits Gait & Station: normal Patient leans: N/A  Psychiatric Specialty Exam: Physical Exam  ROS  Blood pressure 128/87, pulse 74, temperature 98.5 F (36.9 C), temperature source Oral, resp. rate 16, height 5\' 3"  (1.6 m), weight 175 lb (79.379 kg), unknown if currently breastfeeding.Body mass index is 31.01 kg/(m^2).  General Appearance: Well Groomed  Eye Contact::  Good  Speech:  Normal Rate409  Volume:  Normal  Mood:  at this time denies depression, states she feels better   Affect:  Appropriate and more reactive  Thought Process:  Goal Directed and Linear  Orientation:  Full (Time, Place, and Person)  Thought Content:  no hallucinations, no delusions  Suicidal Thoughts:  No  Homicidal Thoughts:  No  Memory:  recent and remote grossly intact   Judgement:  Other:  improved   Insight:  improved   Psychomotor Activity:  Normal  Concentration:  Good  Recall:  Good  Fund of Knowledge:Good  Language: Good  Akathisia:  Negative  Handed:  Right  AIMS (if indicated):     Assets:  Communication Skills Desire for Improvement Resilience Social Support  Sleep:  Number of Hours: 6.75  Cognition: WNL  ADL's:   Improved    Have you used any form of tobacco in the last 30 days? (Cigarettes, Smokeless Tobacco, Cigars, and/or Pipes): Yes  Has this patient used any form of tobacco in the last 30 days? (Cigarettes, Smokeless Tobacco, Cigars, and/or Pipes) Yes, A prescription for an FDA-approved tobacco cessation medication was offered at discharge and the patient refused  Mental Status Per Nursing Assessment::   On Admission:  NA  Current Mental Status by Physician: At this time patient is improved compared to her admission-  less depressed and currently denying sadness or depression- affect more reactive, no thought disorder, no hallucinations, no delusions, no SI , no HI, also , specifically denies any thoughts of hurting her child, there are no psychotic symptoms, she is 0x3 .   Loss Factors: S/P pregnancy, delivery by C section 4 weeks ago. States she had not been taking her medications during pregnancy affected her mood adversely. Financial difficulties .  Historical Factors: History of Depression, history of one prior psychiatric admission. History of O/D   Risk Reduction Factors:   Responsible for children under 65 years of age, Sense of responsibility to family, Living with another person, especially a relative, Positive social support and Positive coping skills or problem solving skills  Continued Clinical Symptoms:   As noted currently much improved compared to admission  Cognitive Features That Contribute To Risk:  No gross cognitive deficits noted upon discharge. Is alert , attentive, and oriented x 3   Suicide Risk:  Mild:  Suicidal ideation of limited frequency, intensity, duration, and specificity.  There are no identifiable plans, no associated intent, mild dysphoria and related symptoms, good self-control (both objective and subjective assessment), few other risk factors, and identifiable protective factors, including available and accessible social support.  Principal Problem: MDD (major depressive disorder), recurrent severe, without psychosis Discharge Diagnoses:  Patient Active Problem List   Diagnosis Date Noted  . MDD (major depressive disorder), recurrent severe, without psychosis [F33.2] 06/11/2015  . Pregnancy [Z33.1] 05/15/2015  . S/P cesarean section [Z98.89] 05/15/2015  .  [redacted] weeks gestation of pregnancy [Z3A.27]   . Abnormal fetal ultrasound [O28.9]   . Encounter for fetal anatomic survey [Z36]   . Low lying placenta without hemorrhage, antepartum [O44.00]   . [redacted] weeks  gestation of pregnancy [Z3A.21]   . MDD (major depressive disorder), recurrent episode, severe [F33.2] 06/14/2014  . Drug overdose [T50.901A] 06/11/2014  . Decreased level of consciousness [R40.4] 06/11/2014  . Hypotension due to drugs [I95.2] 06/11/2014  . Suicide attempt by drug ingestion [T50.902A] 06/11/2014  . Poisoning by carbamazepine [T42.1X1A] 06/11/2014  . Chronic cholecystitis with calculus [K80.10] 01/08/2012  . IRREGULAR MENSES [N92.6] 08/11/2009  . OBESITY, UNSPECIFIED [E66.9] 07/15/2009  . ANXIETY STATE, UNSPECIFIED [F41.1] 07/15/2009  . HERPES SIMPLEX INFECTION [B00.9] 04/20/2008  . NICOTINE ADDICTION [Z72.0] 04/13/2008    Follow-up Information    Follow up with [facility] On 06/16/2015.   Specialty:  Family Medicine   Why:  at 1:30pm for your initial intake.   Contact information:   [address] [place]ella Raring [place] [address] [phone]       Plan Of Care/Follow-up recommendations:  Activity:  as tolerated  Diet:  regular Tests:  NA Other:  see below  Is patient on multiple antipsychotic therapies at discharge:  No   Has Patient had three or more failed trials of antipsychotic monotherapy by history:  No  Recommended Plan for Multiple Antipsychotic Therapies: NA  Patient is leaving unit in good spirits. States mother is going to pick her up later today. Plans to follow up as above . Has an established Ob/Gyn Specialist, Dr. Gaynell Face, whom she plans to follow up with for ongoing care .  COBOS, FERNANDO 06/15/2015, 11:02 AM

## 2015-06-15 NOTE — Progress Notes (Signed)
D. Pt has been up and visible in milieu this evening, did attend and participate in evening group activity. Pt has appeared flat and depressed but reports that she is feeling better and did speak about how she is hopeful for discharge soon. Pt did receive medication this evening without incident. A. Support and encouragement provided. R. Safety maintained, will continue to monitor.

## 2015-06-15 NOTE — Progress Notes (Signed)
Recreation Therapy Notes  Date: 07.27.16 Time: 9:30 am Location: [address] Group Room  Group Topic: Stress Management  Goal Area(s) Addresses:  Patient will verbalize importance of using healthy stress management.  Patient will identify positive emotions associated with healthy stress management.   Behavioral Response: Engaged  Intervention: Stress Management  Activity :  Guided Training and development officer.  LRT introduced the technique of guided imagery.  A script was used to deliver the technique to the patients.  Patients were asked to follow the script read a loud by LRT to engage in the technique of guided imagery.  Education:  Stress Management, Discharge Planning.   Education Outcome: Acknowledges edcuation/In group clarification offered/Needs additional education  Clinical Observations/Feedback: Patient attended group.  Caroll Rancher, LRT/CTRS  Caroll Rancher A 06/15/2015 12:30 PM

## 2015-06-15 NOTE — Discharge Summary (Signed)
Physician Discharge Summary Note  Patient:  Patty Valdez is an 29 y.o., female MRN:  [id number] DOB:  [birth year] Patient phone:  [phone] (home)  Patient address:   [address] [place] [address],  Total Time spent with patient: 45 minutes  Date of Admission:  06/11/2015 Date of Discharge: 06/15/2015  Reason for Admission:  depression  Principal Problem: MDD (major depressive disorder), recurrent severe, without psychosis Discharge Diagnoses: Patient Active Problem List   Diagnosis Date Noted  . MDD (major depressive disorder), recurrent severe, without psychosis [F33.2] 06/11/2015  . Pregnancy [Z33.1] 05/15/2015  . S/P cesarean section [Z98.89] 05/15/2015  . [redacted] weeks gestation of pregnancy [Z3A.27]   . Abnormal fetal ultrasound [O28.9]   . Encounter for fetal anatomic survey [Z36]   . Low lying placenta without hemorrhage, antepartum [O44.00]   . [redacted] weeks gestation of pregnancy [Z3A.21]   . MDD (major depressive disorder), recurrent episode, severe [F33.2] 06/14/2014  . Drug overdose [T50.901A] 06/11/2014  . Decreased level of consciousness [R40.4] 06/11/2014  . Hypotension due to drugs [I95.2] 06/11/2014  . Suicide attempt by drug ingestion [T50.902A] 06/11/2014  . Poisoning by carbamazepine [T42.1X1A] 06/11/2014  . Chronic cholecystitis with calculus [K80.10] 01/08/2012  . IRREGULAR MENSES [N92.6] 08/11/2009  . OBESITY, UNSPECIFIED [E66.9] 07/15/2009  . ANXIETY STATE, UNSPECIFIED [F41.1] 07/15/2009  . HERPES SIMPLEX INFECTION [B00.9] 04/20/2008  . NICOTINE ADDICTION [Z72.0] 04/13/2008    Musculoskeletal: Strength & Muscle Tone: within normal limits Gait & Station: normal Patient leans: N/A  Psychiatric Specialty Exam: Physical Exam  Vitals reviewed. Psychiatric: Her mood appears anxious. She does not exhibit a depressed mood.    Review of Systems  Respiratory: Negative for cough.   Cardiovascular: Negative for chest pain.  All other systems  reviewed and are negative.   Blood pressure 128/87, pulse 74, temperature 98.5 F (36.9 C), temperature source Oral, resp. rate 16, height _0  (1.6 m), weight 79.379 kg (175 lb), unknown if currently breastfeeding.Body mass index is 31.01 kg/(m^2).   General Appearance: Well Groomed  Eye Contact:: Good  Speech: Normal Rate409  Volume: Normal  Mood: at this time denies depression, states she feels better   Affect: Appropriate and more reactive  Thought Process: Goal Directed and Linear  Orientation: Full (Time, Place, and Person)  Thought Content: no hallucinations, no delusions  Suicidal Thoughts: No  Homicidal Thoughts: No  Memory: recent and remote grossly intact   Judgement: Other: improved   Insight: improved   Psychomotor Activity: Normal  Concentration: Good  Recall: Good  Fund of Knowledge:Good  Language: Good  Akathisia: Negative  Handed: Right  AIMS (if indicated):    Assets: Communication Skills Desire for Improvement Resilience Social Support  Sleep: Number of Hours: 6.75  Cognition: WNL  ADL's: Improved        Have you used any form of tobacco in the last 30 days? (Cigarettes, Smokeless Tobacco, Cigars, and/or Pipes): Yes  Has this patient used any form of tobacco in the last 30 days? (Cigarettes, Smokeless Tobacco, Cigars, and/or Pipes) Yes, A prescription for an FDA-approved tobacco cessation medication was offered at discharge and the patient refusedsamples and Rx given to patient  Past Medical History:  Past Medical History  Diagnosis Date  . Gallstones   . Nausea   . Abdominal pain   . Vaginal bleeding   . Anemia 10 yrs ago  . Anxiety   . Genital herpes     no current meds for  . Complication of anesthesia 04-08-2009  low blood pressure after epidural, had to take iv meds for  . Migraine   . Obesity     Past Surgical History  Procedure Laterality Date  . No past surgeries    .  Cholecystectomy    . Cesarean section N/A 05/15/2015    Procedure: CESAREAN SECTION;  Surgeon: Frederico Hamman, MD;  Location: [facility];  Service: Obstetrics;  Laterality: N/A;   Family History:  Family History  Problem Relation Age of Onset  . Cancer Maternal Grandfather     prostate  . Hypertension Mother   . Diabetes Mother   . Neuropathy Mother    Social History:  History  Alcohol Use No    Comment: last drink was 1 yr ago     History  Drug Use  . Yes  . Special: Marijuana    Comment: last used a year ago as of 05/13/2015    History   Social History  . Marital Status: Married    Spouse Name: N/A  . Number of Children: N/A  . Years of Education: N/A   Social History Main Topics  . Smoking status: Current Every Day Smoker -- 0.50 packs/day for 10 years    Types: Cigarettes  . Smokeless tobacco: Never Used  . Alcohol Use: No     Comment: last drink was 1 yr ago  . Drug Use: Yes    Special: Marijuana     Comment: last used a year ago as of 05/13/2015  . Sexual Activity: Yes    Birth Control/ Protection: None   Other Topics Concern  . None   Social History Narrative   Risk to Self: Suicidal Ideation: Yes-Currently Present Suicidal Intent: No Is patient at risk for suicide?: Yes Suicidal Plan?: No Access to Means: Yes Specify Access to Suicidal Means: pills What has been your use of drugs/alcohol within the last 12 months?: denies How many times?: 2 Other Self Harm Risks: none known Triggers for Past Attempts:  (depression) Intentional Self Injurious Behavior: Cutting Comment - Self Injurious Behavior:  (hx of cutting behavior) Risk to Others: Homicidal Ideation: No Thoughts of Harm to Others: No Current Homicidal Intent: No Current Homicidal Plan: No Access to Homicidal Means: No History of harm to others?: No Assessment of Violence: None Noted Does patient have access to weapons?: No Criminal Charges Pending?: No Does patient have a court date:  No Prior Inpatient Therapy: Prior Inpatient Therapy: Yes Prior Therapy Dates: 2015 Prior Therapy Facilty/Provider(s): [facility] Reason for Treatment: depression Prior Outpatient Therapy: Prior Outpatient Therapy: No Does patient have an ACCT team?: No Does patient have Intensive In-House Services?  : No Does patient have [facility] services? : No Does patient have P4CC services?: No  Level of Care:  OP  Hospital Course:  SAMIAH RICKLEFS is an 29 y.o. female who came to [facility] as a walk in wanted to get help with her depression.  Kennon Holter was admitted for MDD (major depressive disorder), recurrent severe, without psychosis and crisis management.  She was treated discharged with the medications listed below under Medication List.  Medical problems were identified and treated as needed.  Home medications were restarted as appropriate.  Improvement was monitored by observation and Kennon Holter daily report of symptom reduction.  Emotional and mental status was monitored by daily self-inventory reports completed by Kennon Holter and clinical staff.         Kennon Holter was evaluated by the treatment team for stability  and plans for continued recovery upon discharge.  Kennon Holter motivation was an integral factor for scheduling further treatment.  Employment, transportation, bed availability, health status, family support, and any pending legal issues were also considered during her hospital stay.  She was offered further treatment options upon discharge including but not limited to Residential, Intensive Outpatient, and Outpatient treatment.  Kennon Holter will follow up with the services as listed below under Follow Up Information.     Upon completion of this admission the patient was both mentally and medically stable for discharge denying suicidal/homicidal ideation, auditory/visual/tactile hallucinations, delusional thoughts and paranoia.      Consults:  psychiatry  Significant  Diagnostic Studies:  labs: per ED  Discharge Vitals:   Blood pressure 128/87, pulse 74, temperature 98.5 F (36.9 C), temperature source Oral, resp. rate 16, height _0  (1.6 m), weight 79.379 kg (175 lb), unknown if currently breastfeeding. Body mass index is 31.01 kg/(m^2). Lab Results:   Results for orders placed or performed during the hospital encounter of 06/11/15 (from the past 72 hour(s))  CBC     Status: Abnormal   Collection Time: 06/13/15  6:30 AM  Result Value Ref Range   WBC 6.4 4.0 - 10.5 K/uL   RBC 3.62 (L) 3.87 - 5.11 MIL/uL   Hemoglobin 10.5 (L) 12.0 - 15.0 g/dL   HCT 32.7 (L) 36.0 - 46.0 %   MCV 90.3 78.0 - 100.0 fL   MCH 29.0 26.0 - 34.0 pg   MCHC 32.1 30.0 - 36.0 g/dL   RDW 13.5 11.5 - 15.5 %   Platelets 268 150 - 400 K/uL    Comment: Performed at [facility]  Comprehensive metabolic panel     Status: Abnormal   Collection Time: 06/13/15  6:30 AM  Result Value Ref Range   Sodium 134 (L) 135 - 145 mmol/L   Potassium 4.0 3.5 - 5.1 mmol/L   Chloride 105 101 - 111 mmol/L   CO2 24 22 - 32 mmol/L   Glucose, Bld 111 (H) 65 - 99 mg/dL   BUN 11 6 - 20 mg/dL   Creatinine, Ser 0.59 0.44 - 1.00 mg/dL   Calcium 8.4 (L) 8.9 - 10.3 mg/dL   Total Protein 7.0 6.5 - 8.1 g/dL   Albumin 3.4 (L) 3.5 - 5.0 g/dL   AST 28 15 - 41 U/L   ALT 30 14 - 54 U/L   Alkaline Phosphatase 118 38 - 126 U/L   Total Bilirubin <0.1 (L) 0.3 - 1.2 mg/dL   GFR calc non Af Amer >60 >60 mL/min   GFR calc Af Amer >60 >60 mL/min    Comment: (NOTE) The eGFR has been calculated using the CKD EPI equation. This calculation has not been validated in all clinical situations. eGFR's persistently <60 mL/min signify possible Chronic Kidney Disease.    Anion gap 5 5 - 15    Comment: Performed at [facility]  TSH     Status: None   Collection Time: 06/13/15  6:30 AM  Result Value Ref Range   TSH 0.610 0.350 - 4.500 uIU/mL    Comment: Performed at [facility]    Physical Findings: AIMS: Facial and Oral Movements Muscles of Facial Expression: None, normal Lips and Perioral Area: None, normal Jaw: None, normal Tongue: None, normal,Extremity Movements Upper (arms, wrists, hands, fingers): None, normal Lower (legs, knees, ankles, toes): None, normal, Trunk Movements Neck, shoulders, hips: None, normal, Overall Severity  Severity of abnormal movements (highest score from questions above): None, normal Incapacitation due to abnormal movements: None, normal Patient's awareness of abnormal movements (rate only patient's report): No Awareness, Dental Status Current problems with teeth and/or dentures?: No Does patient usually wear dentures?: No  CIWA:  CIWA-Ar Total: 1 COWS:  COWS Total Score: 1   See Psychiatric Specialty Exam and Suicide Risk Assessment completed by Attending Physician prior to discharge.  Discharge destination:  Home  Is patient on multiple antipsychotic therapies at discharge:  No   Has Patient had three or more failed trials of antipsychotic monotherapy by history:  No    Recommended Plan for Multiple Antipsychotic Therapies: NA     Medication List    STOP taking these medications        nitrofurantoin (macrocrystal-monohydrate) 100 MG capsule  Commonly known as:  MACROBID     ondansetron 4 MG disintegrating tablet  Commonly known as:  ZOFRAN ODT     oxyCODONE-acetaminophen 5-325 MG per tablet  Commonly known as:  PERCOCET      TAKE these medications      Indication   citalopram 20 MG tablet  Commonly known as:  CELEXA  Take 1 tablet (20 mg total) by mouth at bedtime.   Indication:  Depression     hydrOXYzine 25 MG tablet  Commonly known as:  ATARAX/VISTARIL  Take 1 tablet (25 mg total) by mouth every 6 (six) hours as needed for anxiety.   Indication:  Anxiety Neurosis     nicotine 21 mg/24hr patch  Commonly known as:  NICODERM CQ - dosed in mg/24 hours  Place 1 patch (21 mg  total) onto the skin daily.   Indication:  Nicotine Addiction     valACYclovir 500 MG tablet  Commonly known as:  VALTREX  Take 1 tablet (500 mg total) by mouth 2 (two) times daily as needed. breakouts   Indication:  for breakouts     valACYclovir 500 MG tablet  Commonly known as:  VALTREX  Take 1 tablet (500 mg total) by mouth 2 (two) times daily as needed (for breakouts).   Indication:  infection           Follow-up Information    Follow up with [facility] On 06/16/2015.   Specialty:  Family Medicine   Why:  at 1:30pm for your initial intake.   Contact information:   [address] [place] [address] [phone]       Follow-up recommendations:  Activity:  as tol, diet as tol  Comments:  1.  Take all your medications as prescribed.              2.  Report any adverse side effects to outpatient provider.                       3.  Patient instructed to not use alcohol or illegal drugs while on prescription medicines.            4.  In the event of worsening symptoms, instructed patient to call 911, the crisis hotline or go to nearest emergency room for evaluation of symptoms.  Total Discharge Time:  30 min  Signed: Freda Munro May Agustin AGNP-BC 06/15/2015, 12:35 PM  I personally assessed the patient and formulated the plan Geralyn Flash A. Sabra Heck, M.D.

## 2015-06-15 NOTE — Progress Notes (Signed)
  [facility] Adult Case Management Discharge Plan :  Will you be returning to the same living situation after discharge:  Yes,  home with boyfriend At discharge, do you have transportation home?: Yes,  Pt's mother to provide transportation Do you have the ability to pay for your medications: Yes,  Pt provided with samples and prescriptions  Release of information consent forms completed and in the chart;  Patient's signature needed at discharge.  Patient to Follow up at: Follow-up Information    Follow up with [facility] On 06/16/2015.   Specialty:  Family Medicine   Why:  at 1:30pm for your initial intake.   Contact information:   [address] [place] [address] [phone]       Patient denies SI/HI: Yes,  Pt denies    Safety Planning and Suicide Prevention discussed: Yes,  with friend. See SPE note for further detail  Have you used any form of tobacco in the last 30 days? (Cigarettes, Smokeless Tobacco, Cigars, and/or Pipes): Yes  Has patient been referred to the Quitline?: Patient refused referral  Elaina Hoops 06/15/2015, 10:43 AM

## 2015-10-25 IMAGING — CT CT ABD-PELV W/ CM
2 of 4 series · 16 of 46 positions shown, 18 images · IV contrast (omnipaque)
Comparison: 02/06/2010

CLINICAL DATA: Right lower quadrant pain. Delivered baby boy a [DATE] via C-section. Right lower quadrant pain for 3 days with nausea,
vaginal bleeding since C-section.

EXAM:
CT ABDOMEN AND PELVIS WITH CONTRAST
TECHNIQUE: Multidetector CT imaging of the abdomen and pelvis was performed
using the standard protocol following bolus administration of
intravenous contrast.
CONTRAST:  50mL OMNIPAQUE IOHEXOL 300 MG/ML SOLN, 100mL OMNIPAQUE
IOHEXOL 300 MG/ML SOLN

[Series 2: abd/pel with · axial · 0.77mm/px · z∈[-440,-10]mm · 13 of 94 slices shown, 15 images]
[im 4/94  soft-tissue]
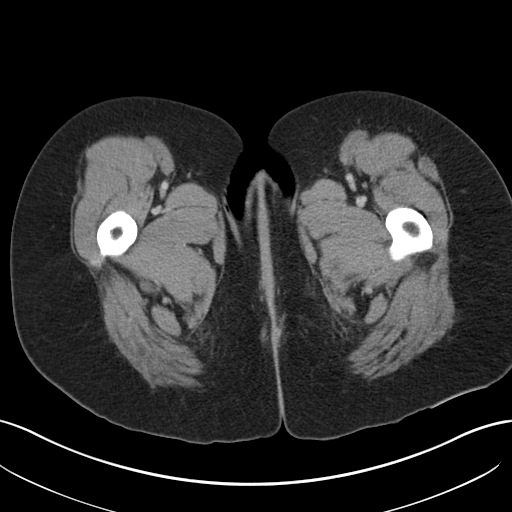
[im 4/94  bone]
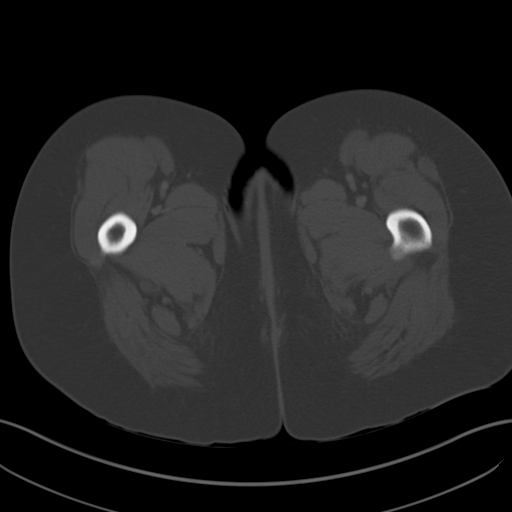
[im 12/94  soft-tissue]
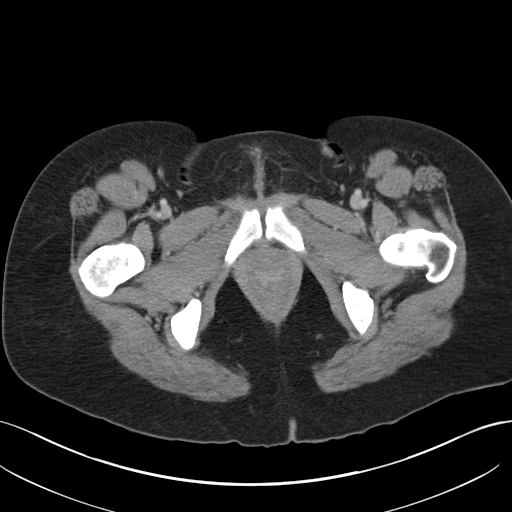
[im 19/94  soft-tissue]
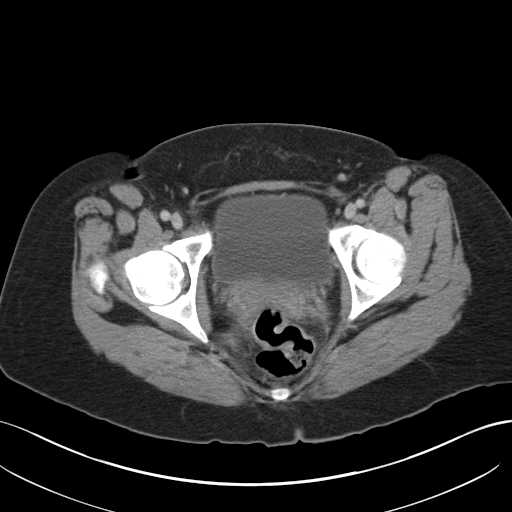
[im 27/94  soft-tissue]
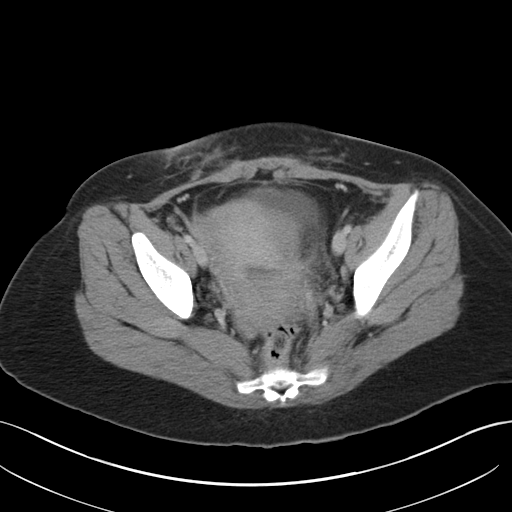
[im 34/94  soft-tissue]
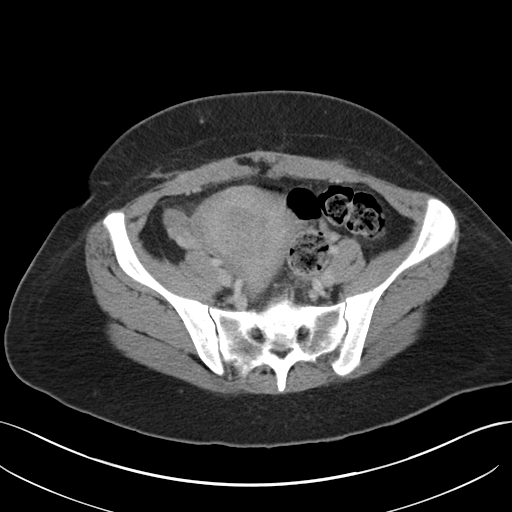
[im 41/94  soft-tissue]
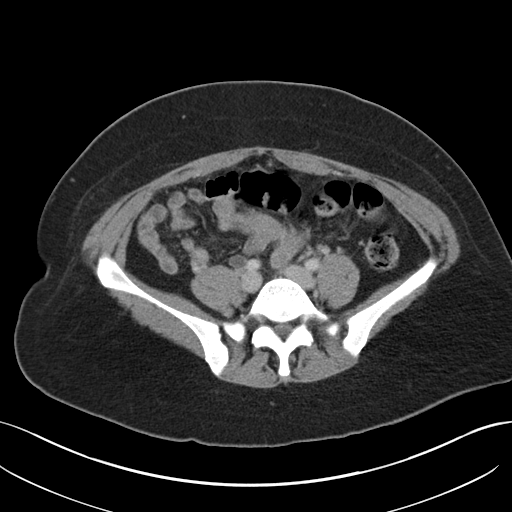
[im 49/94  soft-tissue]
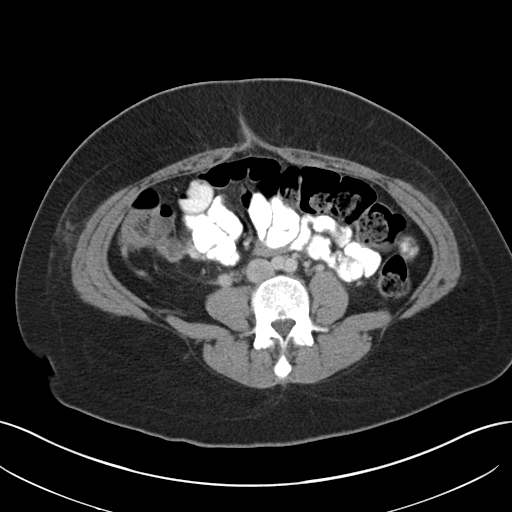
[im 53/94  soft-tissue]
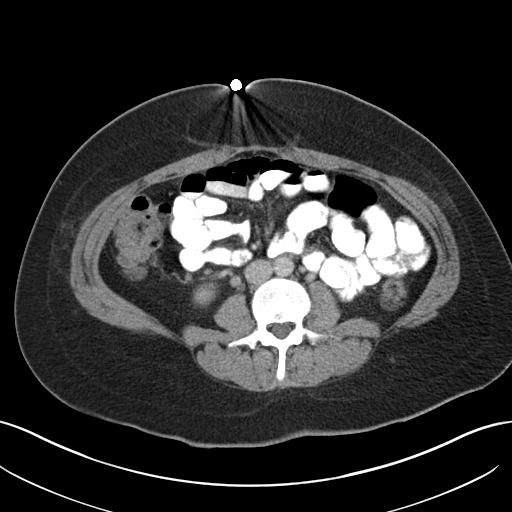
[im 60/94  soft-tissue]
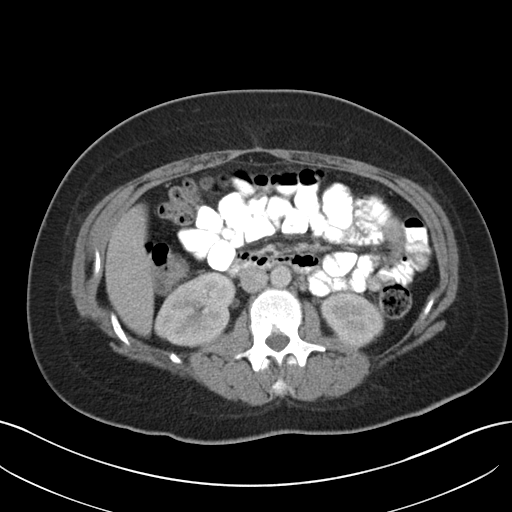
[im 60/94  bone]
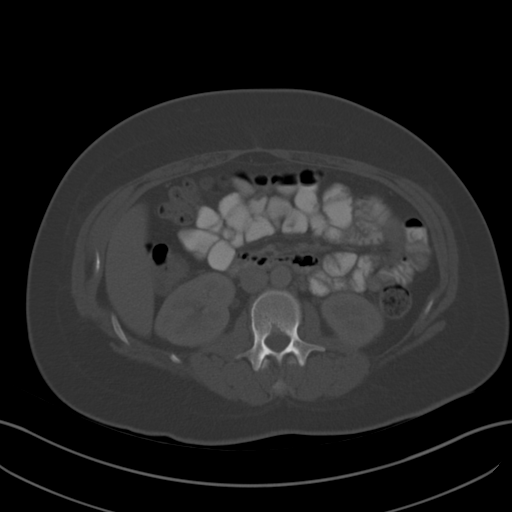
[im 67/94  soft-tissue]
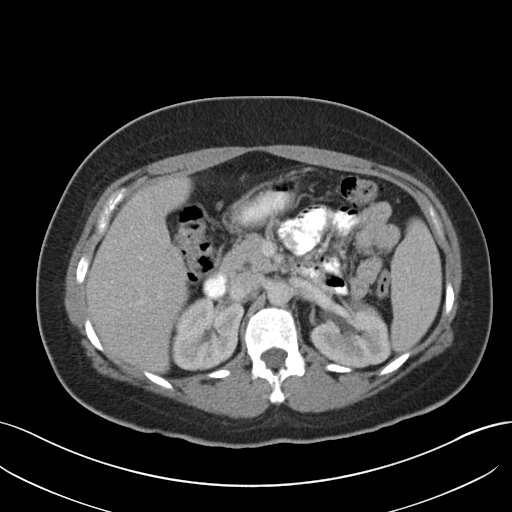
[im 75/94  soft-tissue]
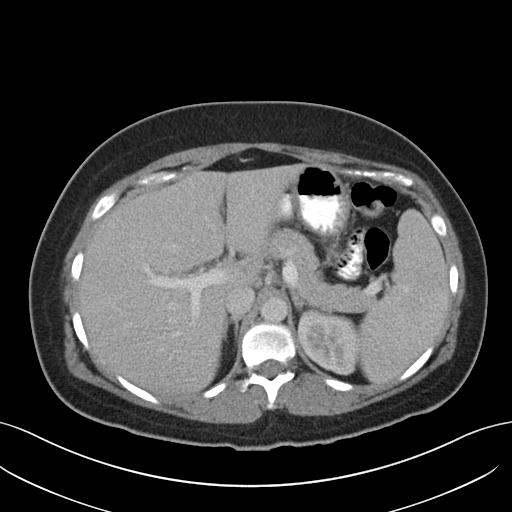
[im 82/94  soft-tissue]
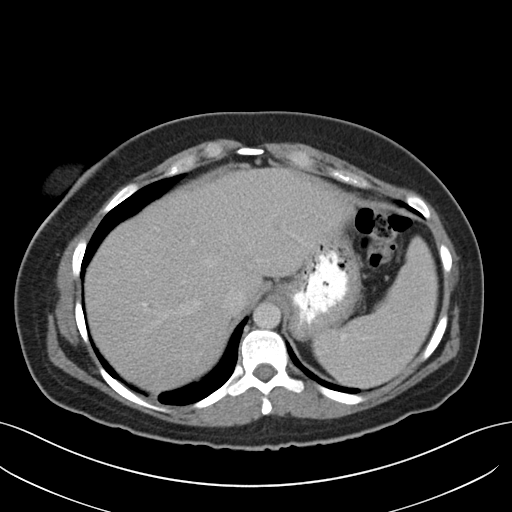
[im 90/94  soft-tissue]
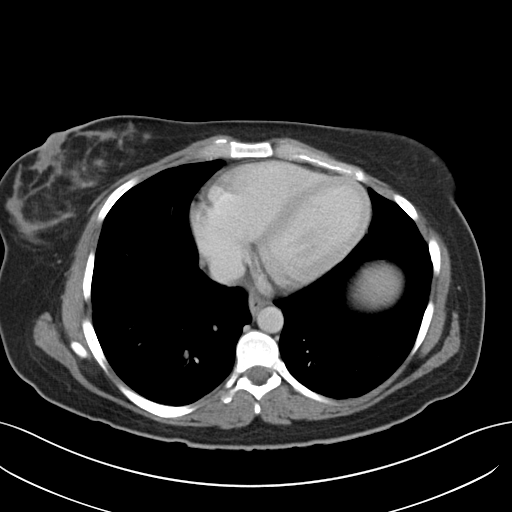

[Series 4: coronal a/|p · coronal · 0.74mm/px · 3 of 100 slices shown]
[im 34/100  soft-tissue]
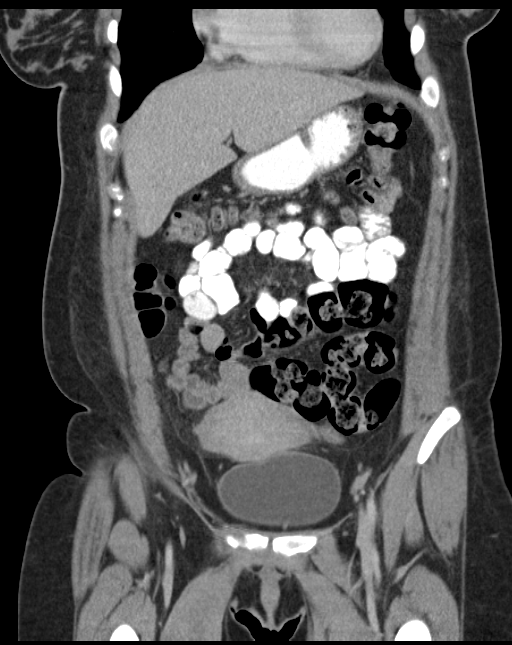
[im 45/100  soft-tissue]
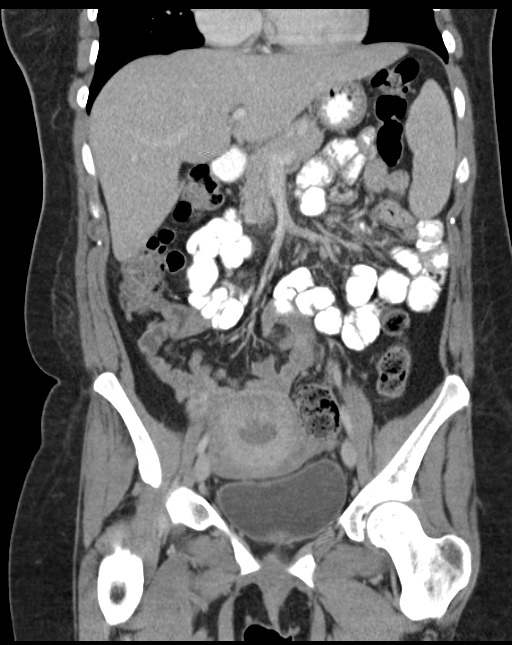
[im 56/100  soft-tissue]
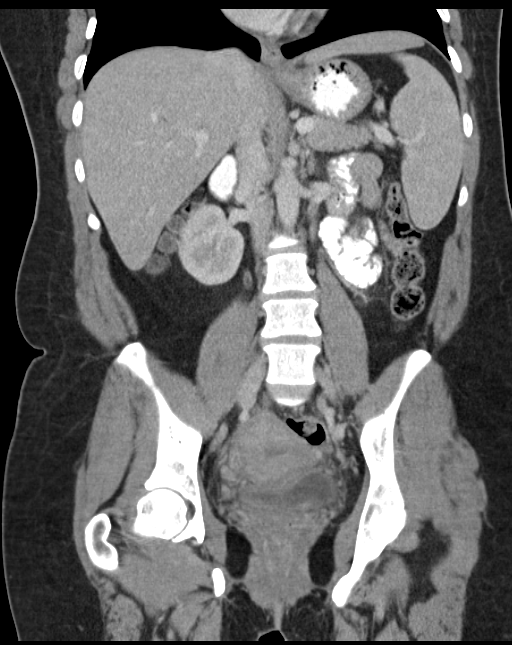

[16 of 46 positions shown; findings below may reference images not displayed]

FINDINGS: Lower chest: 12 mm right lower lobe pulmonary nodule posteriorly
with a pleural tail. Normal heart size.

Hepatobiliary: Normal liver. Prior cholecystectomy. Mild prominence
of the central intrahepatic biliary tree. No extrahepatic biliary
ductal dilatation. No focal hepatic lesion.

Pancreas: Normal pancreas.

Spleen: Normal spleen.

Adrenals/Urinary Tract: Normal adrenal glands. Normal kidneys. No
urolithiasis or obstructive uropathy. Normal bladder.

Stomach/Bowel: No bowel wall thickening or bowel dilatation. No
pneumatosis, pneumoperitoneum or portal venous gas. No abdominal or
pelvic free fluid. Normal appendix.

Vascular/Lymphatic: Normal caliber abdominal aorta. No abdominal or
pelvic lymphadenopathy.

Reproductive: Postpartum uterus. Moderate amount of fluid in the
endometrial cavity which may reflect blood products given the
patient's history vaginal bleeding. Normal ovaries. No adnexal mass.

Other: No focal fluid collection.

Musculoskeletal: No lytic or sclerotic osseous lesion. No acute
osseous abnormality.
IMPRESSION: 1. Normal appendix.
2. Postpartum uterus. Moderate amount of fluid in the endometrial
cavity which may reflect blood products given the patient's history
vaginal bleeding.
3. 12 mm right lower lobe nodular opacity with a pleural tail may
reflect an area of round atelectasis versus a true pulmonary nodule.
Recommend follow-up chest x-ray in 3 months.

## 2016-04-19 ENCOUNTER — Emergency Department (HOSPITAL_COMMUNITY)
Admission: EM | Admit: 2016-04-19 | Discharge: 2016-04-19 | Disposition: A | Discharge status: Home / Self Care | Payer: Medicaid Other | Attending: Emergency Medicine | Admitting: Emergency Medicine

## 2016-04-19 ENCOUNTER — Encounter (HOSPITAL_COMMUNITY): Payer: Self-pay

## 2016-04-19 DIAGNOSIS — R234 Changes in skin texture: Secondary | ICD-10-CM | POA: Insufficient documentation

## 2016-04-19 DIAGNOSIS — R6884 Jaw pain: Secondary | ICD-10-CM | POA: Diagnosis present

## 2016-04-19 DIAGNOSIS — Z79899 Other long term (current) drug therapy: Secondary | ICD-10-CM | POA: Diagnosis not present

## 2016-04-19 DIAGNOSIS — F17210 Nicotine dependence, cigarettes, uncomplicated: Secondary | ICD-10-CM | POA: Insufficient documentation

## 2016-04-19 DIAGNOSIS — S0990XA Unspecified injury of head, initial encounter: Secondary | ICD-10-CM

## 2016-04-19 LAB — CBC WITH DIFFERENTIAL/PLATELET
BASOS ABS: 0 10*3/uL (ref 0.0–0.1)
BASOS PCT: 1 %
Eosinophils Absolute: 0.1 10*3/uL (ref 0.0–0.7)
Eosinophils Relative: 2 %
HCT: 32.2 % — ABNORMAL LOW (ref 36.0–46.0)
HEMOGLOBIN: 10.5 g/dL — AB (ref 12.0–15.0)
Lymphocytes Relative: 31 %
Lymphs Abs: 2.1 10*3/uL (ref 0.7–4.0)
MCH: 27.2 pg (ref 26.0–34.0)
MCHC: 32.6 g/dL (ref 30.0–36.0)
MCV: 83.4 fL (ref 78.0–100.0)
MONO ABS: 0.5 10*3/uL (ref 0.1–1.0)
Monocytes Relative: 8 %
NEUTROS PCT: 58 %
Neutro Abs: 4 10*3/uL (ref 1.7–7.7)
Platelets: 255 10*3/uL (ref 150–400)
RBC: 3.86 MIL/uL — AB (ref 3.87–5.11)
RDW: 14.9 % (ref 11.5–15.5)
WBC: 6.8 10*3/uL (ref 4.0–10.5)

## 2016-04-19 MED ORDER — IBUPROFEN 400 MG PO TABS
600.0000 mg | ORAL_TABLET | Freq: Once | ORAL | Status: AC
  Administered 2016-04-19: 600 mg via ORAL
  Filled 2016-04-19: qty 1

## 2016-04-19 NOTE — ED Provider Notes (Signed)
CSN: [id number]     Arrival date & time 04/19/16  1740 History  By signing my name below, I, Hollace HaywardAndrew Hiatt, attest that this documentation has been prepared under the direction and in the presence of Terance HartKelly Jeoffrey Eleazer, PA-C.   Electronically Signed: Hollace HaywardAndrew Hiatt, ED Scribe. 04/19/2016. 6:17 PM.  Chief Complaint  Patient presents with  . Abscess   The history is provided by the patient. No language interpreter was used.   HPI Comments: Patty Valdez is a 30 y.o. female who presents to the Emergency Department complaining of bilateral dema and mandibular pain that began 1 day ago after trying to pop a center frontal papule. Pt reports that when she tried to "pop" the irritated site clear fluid and blood were pushed out. She reports that the site has scabbed over and bleeding is controlled. Pt reports that she has had associated frontal HA and fever (tmax 100.4). Pt reports that her right mandible and site of irritation are painful to palpation. Pt has no pertinent PMHx. Pt is unsure if she was bitten by a bug or tick. Pt denies usually having general acne problems. Denies inability to swallow. No OTC medications or home remedies tried PTA.   Past Medical History  Diagnosis Date  . Gallstones   . Nausea   . Abdominal pain   . Vaginal bleeding   . Anemia 10 yrs ago  . Anxiety   . Genital herpes     no current meds for  . Complication of anesthesia 04-08-2009    low blood pressure after epidural, had to take iv meds for  . Migraine   . Obesity    Past Surgical History  Procedure Laterality Date  . No past surgeries    . Cholecystectomy    . Cesarean section N/A 05/15/2015    Procedure: CESAREAN SECTION;  Surgeon: Kathreen CosierBernard A Marshall, MD;  Location: [facility];  Service: Obstetrics;  Laterality: N/A;   Family History  Problem Relation Age of Onset  . Cancer Maternal Grandfather     prostate  . Hypertension Mother   . Diabetes Mother   . Neuropathy Mother    Social History  Substance Use  Topics  . Smoking status: Current Every Day Smoker -- 0.50 packs/day for 10 years    Types: Cigarettes  . Smokeless tobacco: Never Used  . Alcohol Use: No     Comment: last drink was 1 yr ago   OB History    Gravida Para Term Preterm AB TAB SAB Ectopic Multiple Living   3 3 3       0 3     Review of Systems  Constitutional: Positive for fever (Tmax 100.4).  HENT: Positive for facial swelling (right sided).   Skin: Positive for wound (frontal forehead).       -bug bite  Neurological: Positive for headaches.    Allergies  Review of patient's allergies indicates no known allergies.  Home Medications   Prior to Admission medications   Medication Sig Start Date End Date Taking? Authorizing Provider  citalopram (CELEXA) 20 MG tablet Take 1 tablet (20 mg total) by mouth at bedtime. 06/15/15   Adonis BrookSheila Agustin, NP  hydrOXYzine (ATARAX/VISTARIL) 25 MG tablet Take 1 tablet (25 mg total) by mouth every 6 (six) hours as needed for anxiety. 06/15/15   Adonis BrookSheila Agustin, NP  nicotine (NICODERM CQ - DOSED IN MG/24 HOURS) 21 mg/24hr patch Place 1 patch (21 mg total) onto the skin daily. 06/15/15   Adonis BrookSheila Agustin, NP  valACYclovir (VALTREX) 500 MG tablet Take 1 tablet (500 mg total) by mouth 2 (two) times daily as needed. breakouts 06/15/15   Adonis Brook, NP  valACYclovir (VALTREX) 500 MG tablet Take 1 tablet (500 mg total) by mouth 2 (two) times daily as needed (for breakouts). 06/15/15   Adonis Brook, NP   BP 148/89 mmHg  Pulse 99  Temp(Src) 99.9 F (37.7 C) (Oral)  Resp 18  SpO2 100%   Physical Exam  Constitutional: She is oriented to person, place, and time. She appears well-developed and well-nourished. No distress.  HENT:  Head: Normocephalic and atraumatic.  No swelling of forehead or jaw. Tenderness to palpation of bilateral TMJ. Decreased ROM TMJ.  Eyes: Conjunctivae are normal. Pupils are equal, round, and reactive to light. Right eye exhibits no discharge. Left eye exhibits no  discharge. No scleral icterus.  Neck: Normal range of motion.  Pulmonary/Chest: Effort normal. No respiratory distress.  Abdominal: She exhibits no distension.  Neurological: She is alert and oriented to person, place, and time.  Skin: Skin is warm and dry.  Scabbed over area with minimal surrounding erythema on central forehead near hairline. No area of drainage or fluctuance.   Psychiatric: She has a normal mood and affect.    ED Course  Procedures (including critical care time)  DIAGNOSTIC STUDIES: Oxygen Saturation is 100% on RA, normal by my interpretation.   COORDINATION OF CARE: 6:15 PM-Discussed next steps with pt including CBC and dose of Ibuprofen for pain management. Pt verbalized understanding and is agreeable with the plan.   Labs Review Labs Reviewed  CBC WITH DIFFERENTIAL/PLATELET - Abnormal; Notable for the following:    RBC 3.86 (*)    Hemoglobin 10.5 (*)    HCT 32.2 (*)    All other components within normal limits    Imaging Review No results found. I have personally reviewed and evaluated these images and lab results as part of my medical decision-making.   EKG Interpretation None      MDM   Final diagnoses:  Closed wound of head, initial encounter   30 year old female who presents with wound from popping a papule on her forehead. She has a low grade temp of 99.4-99.9. All other vitals are WNL. No signs of cellulitis or abscess on PE. No swelling noted on exam. Patient is irritable and is giving poor effort on exam of TMJ. CBC is unremarkable - patient has baseline anemia. Patient is NAD, non-toxic, with stable VS. Patient is informed of clinical course however she is not happy with answers given. Return precautions given. Opportunity for questions provided in room however once I left the room the patient told RN that her questions were not answered and decided to leave.   I personally performed the services described in this documentation, which was  scribed in my presence. The recorded information has been reviewed and is accurate.     Bethel Born, PA-C 04/20/16 0206  Jerelyn Scott, MD 04/20/16 [phone]

## 2016-04-19 NOTE — ED Notes (Signed)
Pt up to restroom.  Gait steady and even.   

## 2016-04-19 NOTE — Discharge Instructions (Signed)
Please take Ibuprofen 400mg  for pain and inflammation. Return to the emergency room if symptoms are worsening.

## 2016-04-19 NOTE — ED Notes (Signed)
Patient able to ambulate independently.   Pt rushed RN for discharge papers.  Then when RN went in to room, pt initially did not want to sign for discharge paperwork receival.  Pt sts all of her questions were not answered, and concerns not addressed.  This RN attempted to ask patient what other concerns she could help her address, and patient states "I just want to get out of here".  Pt signed and left without any additional questions

## 2016-04-19 NOTE — ED Notes (Signed)
Patient here with swelling to forehead after she attempted to pop pimple to same this past Monday. Tenderness to forehead and right side of face

## 2016-07-28 ENCOUNTER — Encounter: Payer: Self-pay | Admitting: *Deleted

## 2016-07-28 LAB — LAB REPORT - SCANNED: Pap: NEGATIVE

## 2016-07-29 ENCOUNTER — Inpatient Hospital Stay (HOSPITAL_COMMUNITY): Payer: Medicaid Other

## 2016-07-29 ENCOUNTER — Encounter (HOSPITAL_COMMUNITY): Payer: Self-pay | Admitting: *Deleted

## 2016-07-29 ENCOUNTER — Inpatient Hospital Stay (HOSPITAL_COMMUNITY)
Admission: AD | Admit: 2016-07-29 | Discharge: 2016-07-29 | Disposition: A | Discharge status: Home / Self Care | Payer: Medicaid Other | Source: Ambulatory Visit | Attending: Obstetrics and Gynecology | Admitting: Obstetrics and Gynecology

## 2016-07-29 DIAGNOSIS — T85628A Displacement of other specified internal prosthetic devices, implants and grafts, initial encounter: Secondary | ICD-10-CM | POA: Diagnosis not present

## 2016-07-29 DIAGNOSIS — Z975 Presence of (intrauterine) contraceptive device: Secondary | ICD-10-CM | POA: Insufficient documentation

## 2016-07-29 DIAGNOSIS — N83202 Unspecified ovarian cyst, left side: Secondary | ICD-10-CM | POA: Insufficient documentation

## 2016-07-29 DIAGNOSIS — N9489 Other specified conditions associated with female genital organs and menstrual cycle: Secondary | ICD-10-CM | POA: Diagnosis present

## 2016-07-29 DIAGNOSIS — N760 Acute vaginitis: Secondary | ICD-10-CM | POA: Diagnosis not present

## 2016-07-29 DIAGNOSIS — A499 Bacterial infection, unspecified: Secondary | ICD-10-CM

## 2016-07-29 DIAGNOSIS — B9689 Other specified bacterial agents as the cause of diseases classified elsewhere: Secondary | ICD-10-CM

## 2016-07-29 DIAGNOSIS — T8389XA Other specified complication of genitourinary prosthetic devices, implants and grafts, initial encounter: Secondary | ICD-10-CM | POA: Diagnosis not present

## 2016-07-29 DIAGNOSIS — R102 Pelvic and perineal pain: Secondary | ICD-10-CM | POA: Insufficient documentation

## 2016-07-29 DIAGNOSIS — T8332XA Displacement of intrauterine contraceptive device, initial encounter: Secondary | ICD-10-CM

## 2016-07-29 LAB — WET PREP, GENITAL
Sperm: NONE SEEN
TRICH WET PREP: NONE SEEN
YEAST WET PREP: NONE SEEN

## 2016-07-29 LAB — URINALYSIS, ROUTINE W REFLEX MICROSCOPIC
Bilirubin Urine: NEGATIVE
Glucose, UA: NEGATIVE mg/dL
Ketones, ur: 15 mg/dL — AB
LEUKOCYTES UA: NEGATIVE
NITRITE: NEGATIVE
PH: 5.5 (ref 5.0–8.0)
Protein, ur: NEGATIVE mg/dL
Specific Gravity, Urine: 1.025 (ref 1.005–1.030)

## 2016-07-29 LAB — URINE MICROSCOPIC-ADD ON

## 2016-07-29 LAB — POCT PREGNANCY, URINE: Preg Test, Ur: NEGATIVE

## 2016-07-29 MED ORDER — KETOROLAC TROMETHAMINE 30 MG/ML IJ SOLN: 30.0000 mg | Freq: Once | INTRAMUSCULAR | Status: DC

## 2016-07-29 MED ORDER — METRONIDAZOLE 500 MG PO TABS: 500.0000 mg | ORAL_TABLET | Freq: Two times a day (BID) | ORAL | 0 refills | Status: DC

## 2016-07-29 NOTE — MAU Provider Note (Signed)
History     CSN: [id number]  Arrival date and time: 07/29/16 1723   None     Chief Complaint  Patient presents with  . Pelvic Pain   Non-pregnant female c/o "cervical pain" that started earlier today. She describes as crampy and intermittent. She has not tried home remedies or OTCs. She denies VB, discharge, and odor. No recent IC. She denies any new partners. She is contracepting with Mirena and reports husband has been able to feel strings more recently. She has hx of HSV and no recent outbreaks.   Past Medical History:  Diagnosis Date  . Abdominal pain   . Anemia 10 yrs ago  . Anxiety   . Complication of anesthesia 04-08-2009   low blood pressure after epidural, had to take iv meds for  . Gallstones   . Genital herpes    no current meds for  . Migraine   . Nausea   . Obesity   . Vaginal bleeding     Past Surgical History:  Procedure Laterality Date  . CESAREAN SECTION N/A 05/15/2015   Procedure: CESAREAN SECTION;  Surgeon: Kathreen Cosier, MD;  Location: [facility];  Service: Obstetrics;  Laterality: N/A;  . CHOLECYSTECTOMY    . NO PAST SURGERIES      Family History  Problem Relation Age of Onset  . Cancer Maternal Grandfather     prostate  . Hypertension Mother   . Diabetes Mother   . Neuropathy Mother     Social History  Substance Use Topics  . Smoking status: Current Every Day Smoker    Packs/day: 0.50    Years: 10.00    Types: Cigarettes  . Smokeless tobacco: Never Used  . Alcohol use No     Comment: last drink was 1 yr ago    Allergies: No Known Allergies  Prescriptions Prior to Admission  Medication Sig Dispense Refill Last Dose  . citalopram (CELEXA) 20 MG tablet Take 1 tablet (20 mg total) by mouth at bedtime. 30 tablet 0   . hydrOXYzine (ATARAX/VISTARIL) 25 MG tablet Take 1 tablet (25 mg total) by mouth every 6 (six) hours as needed for anxiety. 30 tablet 0   . nicotine (NICODERM CQ - DOSED IN MG/24 HOURS) 21 mg/24hr patch Place 1 patch (21  mg total) onto the skin daily. 28 patch 0   . valACYclovir (VALTREX) 500 MG tablet Take 1 tablet (500 mg total) by mouth 2 (two) times daily as needed. breakouts  0   . valACYclovir (VALTREX) 500 MG tablet Take 1 tablet (500 mg total) by mouth 2 (two) times daily as needed (for breakouts). 60 tablet 0     Review of Systems  Constitutional: Negative.   Gastrointestinal: Positive for nausea (since pain started). Negative for abdominal pain and vomiting.  Genitourinary: Negative.    Physical Exam   Blood pressure 137/78, pulse (!) 59, temperature 98.4 F (36.9 C), temperature source Oral, resp. rate 16, height 5\' 3"  (1.6 m), weight 78.9 kg (174 lb), last menstrual period 07/18/2016, unknown if currently breastfeeding.  Physical Exam  Constitutional: She is oriented to person, place, and time. She appears well-developed and well-nourished.  HENT:  Head: Normocephalic and atraumatic.  Neck: Normal range of motion. Neck supple.  Cardiovascular: Normal rate.   Respiratory: Effort normal.  GI: Soft. She exhibits no distension. There is no tenderness.  Genitourinary:  Genitourinary Comments: External: no lesions Vagina: rugated, parous, thick white discharge Uterus: non enlarged, anteverted, non tender, no CMT Adnexae:  no masses, mild tenderness left, no tenderness right   Musculoskeletal: Normal range of motion.  Neurological: She is alert and oriented to person, place, and time.  Skin: Skin is warm and dry.  Psychiatric: She has a normal mood and affect.   [place] Transvaginal Non-ob  Result Date: 07/29/2016 CLINICAL DATA:  Acute pelvic pain. EXAM: TRANSABDOMINAL AND TRANSVAGINAL ULTRASOUND OF PELVIS TECHNIQUE: Both transabdominal and transvaginal ultrasound examinations of the pelvis were performed. Transabdominal technique was performed for global imaging of the pelvis including uterus, ovaries, adnexal regions, and pelvic cul-de-sac. It was necessary to proceed with endovaginal exam  following the transabdominal exam to visualize the ovaries and endometrium. COMPARISON:  06/02/2015 abdominal CT FINDINGS: Uterus Measurements: 7 x 4 x 5 cm. Cesarean section scar. Negative for mass. Endometrium Thickness: 9 mm. No focal abnormality visualized. Malpositioned IUD with stem seen in the endocervical canal and side arms penetrating the cervix on cine clip. No evidence of serosal perforation. Right ovary Measurements: 32 x 15 x 21 mm. Normal appearance/no adnexal mass. Left ovary Measurements: 40 x 23 x 36 mm. Nonvascular 2.5 cm cyst with internal lace-like septations. Other findings No abnormal free fluid. IMPRESSION: 1. Malpositioned IUD, located in the cervix. Side arms may penetrate the cervix; no serosal perforation. 2. 2.5 cm hemorrhagic cyst in the left ovary. Electronically Signed   By: Marnee SpringJonathon  Watts M.D.   On: 07/29/2016 18:54   [place] Pelvis Complete  Result Date: 07/29/2016 CLINICAL DATA:  Acute pelvic pain. EXAM: TRANSABDOMINAL AND TRANSVAGINAL ULTRASOUND OF PELVIS TECHNIQUE: Both transabdominal and transvaginal ultrasound examinations of the pelvis were performed. Transabdominal technique was performed for global imaging of the pelvis including uterus, ovaries, adnexal regions, and pelvic cul-de-sac. It was necessary to proceed with endovaginal exam following the transabdominal exam to visualize the ovaries and endometrium. COMPARISON:  06/02/2015 abdominal CT FINDINGS: Uterus Measurements: 7 x 4 x 5 cm. Cesarean section scar. Negative for mass. Endometrium Thickness: 9 mm. No focal abnormality visualized. Malpositioned IUD with stem seen in the endocervical canal and side arms penetrating the cervix on cine clip. No evidence of serosal perforation. Right ovary Measurements: 32 x 15 x 21 mm. Normal appearance/no adnexal mass. Left ovary Measurements: 40 x 23 x 36 mm. Nonvascular 2.5 cm cyst with internal lace-like septations. Other findings No abnormal free fluid. IMPRESSION: 1.  Malpositioned IUD, located in the cervix. Side arms may penetrate the cervix; no serosal perforation. 2. 2.5 cm hemorrhagic cyst in the left ovary. Electronically Signed   By: Marnee SpringJonathon  Watts M.D.   On: 07/29/2016 18:54    MAU Course  Procedures Toradol 30 mg IM -pt declined  IUD removal: consent obtained, speculum inserted, strings visualized and grasped with kelly clamp, and IUD removed w/o difficulty then discarded in trash. Pt tolerated procedure poorly.   MDM Labs and US ordered and reviewed. Malpositioned IUD. Will also tx BV.  Assessment and Plan   1. Malpositioned intrauterine device (IUD), initial encounter ([facility])   2. Acute pelvic pain, female   3. Bacterial vaginosis   4. Pelvic pain in female    Discharge home Flagyl 500 mg po bid x7 days Abstain from IC or use condoms until new IUD placed [facility]UD placement  Patty LarryMelanie Ozetta Flatley, CNM 07/29/2016, 6:05 PM

## 2016-07-29 NOTE — MAU Note (Signed)
Pt has had IUD for almost 2 years, has had constant pain in her cervix for the last 2 hours.  Pt states it feels like it did when the IUD was inserted.  Denies bleeding.

## 2016-07-29 NOTE — Discharge Instructions (Signed)

## 2016-07-29 NOTE — MAU Note (Signed)
Told pt there was an order for pain medicine. Pt refused and said "I just want to know what's wrong." I told the pt that we are waiting for lab results and that she will be going to ultrasound as soon as they are ready.

## 2016-07-30 LAB — GC/CHLAMYDIA PROBE AMP (~~LOC~~) NOT AT ARMC
CHLAMYDIA, DNA PROBE: NEGATIVE
Neisseria Gonorrhea: NEGATIVE

## 2016-08-08 ENCOUNTER — Encounter (HOSPITAL_COMMUNITY): Payer: Self-pay | Admitting: Emergency Medicine

## 2016-08-08 ENCOUNTER — Ambulatory Visit (HOSPITAL_COMMUNITY)
Admission: EM | Admit: 2016-08-08 | Discharge: 2016-08-08 | Disposition: A | Discharge status: Home / Self Care | Payer: Medicaid Other | Attending: Family Medicine | Admitting: Family Medicine

## 2016-08-08 DIAGNOSIS — A084 Viral intestinal infection, unspecified: Secondary | ICD-10-CM | POA: Diagnosis not present

## 2016-08-08 MED ORDER — LOPERAMIDE HCL 2 MG PO CAPS: 4.0000 mg | ORAL_CAPSULE | Freq: Once | ORAL | 0 refills | Status: AC

## 2016-08-08 NOTE — ED Triage Notes (Signed)
Pt.stated, I need a work note for today.

## 2016-08-08 NOTE — ED Provider Notes (Signed)
CSN: [id number]     Arrival date & time 08/08/16  1938 History   First MD Initiated Contact with Patient 08/08/16 2115     Chief Complaint  Patient presents with  . Diarrhea  . Abdominal Pain   (Consider location/radiation/quality/duration/timing/severity/associated sxs/prior Treatment) Diarrhea started this morning, woke her up from sleep, accompany by abdominal pain, abd pain associated with each diarrhea, abd pain resolved after each bowel movements. Reports no fever. Dine out the day before.    The history is provided by the patient.  Diarrhea  Quality:  Explosive (and loose, non-bloody) Onset quality:  Sudden Number of episodes:  20 episodes since onset Duration:  1 day Timing:  Intermittent Progression:  Unchanged Relieved by:  Nothing Worsened by:  Nothing Ineffective treatments:  None tried Associated symptoms: abdominal pain, chills and headaches   Associated symptoms: no recent cough, no diaphoresis, no fever and no vomiting   Associated symptoms comment:  Has hx of migraine    Past Medical History:  Diagnosis Date  . Abdominal pain   . Anemia 10 yrs ago  . Anxiety   . Complication of anesthesia 04-08-2009   low blood pressure after epidural, had to take iv meds for  . Gallstones   . Genital herpes    no current meds for  . Migraine   . Nausea   . Obesity   . Vaginal bleeding    Past Surgical History:  Procedure Laterality Date  . CESAREAN SECTION N/A 05/15/2015   Procedure: CESAREAN SECTION;  Surgeon: Kathreen Cosier, MD;  Location: [facility];  Service: Obstetrics;  Laterality: N/A;  . CHOLECYSTECTOMY    . NO PAST SURGERIES     Family History  Problem Relation Age of Onset  . Cancer Maternal Grandfather     prostate  . Hypertension Mother   . Diabetes Mother   . Neuropathy Mother    Social History  Substance Use Topics  . Smoking status: Current Every Day Smoker    Packs/day: 0.50    Years: 10.00    Types: Cigarettes  . Smokeless tobacco:  Never Used  . Alcohol use No     Comment: last drink was 1 yr ago   OB History    Gravida Para Term Preterm AB Living   3 3 3     3    SAB TAB Ectopic Multiple Live Births         0 3     Review of Systems  Constitutional: Positive for chills. Negative for diaphoresis and fever.  Gastrointestinal: Positive for abdominal pain and diarrhea. Negative for vomiting.  Neurological: Positive for headaches.    Allergies  Review of patient's allergies indicates no known allergies.  Home Medications   Prior to Admission medications   Medication Sig Start Date End Date Taking? Authorizing Provider  citalopram (CELEXA) 20 MG tablet Take 1 tablet (20 mg total) by mouth at bedtime. 06/15/15   Adonis Brook, NP  hydrOXYzine (ATARAX/VISTARIL) 25 MG tablet Take 1 tablet (25 mg total) by mouth every 6 (six) hours as needed for anxiety. 06/15/15   Adonis Brook, NP  loperamide (IMODIUM) 2 MG capsule Take 2 capsules (4 mg total) by mouth once. Take 4 mg once for diarrhea. May take 2 mg after each diarrhea episode up to 8mg  per day. 08/08/16 08/08/16  Lucia Estelle, NP  metroNIDAZOLE (FLAGYL) 500 MG tablet Take 1 tablet (500 mg total) by mouth 2 (two) times daily. 07/29/16   Donette Larry, CNM  nicotine (NICODERM CQ - DOSED IN MG/24 HOURS) 21 mg/24hr patch Place 1 patch (21 mg total) onto the skin daily. 06/15/15   Adonis BrookSheila Agustin, NP  valACYclovir (VALTREX) 500 MG tablet Take 1 tablet (500 mg total) by mouth 2 (two) times daily as needed. breakouts 06/15/15   Adonis BrookSheila Agustin, NP   Meds Ordered and Administered this Visit  Medications - No data to display  BP 122/70 (BP Location: Left Arm)   Pulse 62   Temp 98.2 F (36.8 C) (Oral)   Resp 16   LMP 07/15/2016   SpO2 100%  No data found.   Physical Exam  Constitutional: She is oriented to person, place, and time. She appears well-developed and well-nourished.  HENT:  Head: Normocephalic and atraumatic.  Eyes: EOM are normal. Pupils are equal, round,  and reactive to light.  Neck: Normal range of motion. Neck supple.  Cardiovascular: Normal rate, regular rhythm and normal heart sounds.   Pulmonary/Chest: Effort normal and breath sounds normal. No respiratory distress. She has no wheezes.  Abdominal: Soft.  Generalized tenderness present on palpation. Bowel sound slightly hyperactive.   Musculoskeletal: Normal range of motion.  Lymphadenopathy:    She has no cervical adenopathy.  Neurological: She is alert and oriented to person, place, and time.  Skin: Skin is warm and dry.  Nursing note and vitals reviewed.   Urgent Care Course   Clinical Course    Procedures (including critical care time)  Labs Review Labs Reviewed - No data to display  Imaging Review No results found.       MDM   1. Viral gastroenteritis    Imodium for diarrhea. Take 4 mg once for diarrhea. May take 2 mg after each diarrhea episode up to 8mg  per day. 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 F/u with PCP or return if diarrhea does not improve by this weekend.    Lucia EstelleFeng Maykel Reitter, NP 08/08/16 2134

## 2016-08-08 NOTE — ED Triage Notes (Signed)
Pt. Stated, I've had diarrhea all day long my stomach is cramping all the time when I go to the bathroom.

## 2016-08-15 ENCOUNTER — Emergency Department (HOSPITAL_COMMUNITY): Payer: Medicaid Other

## 2016-08-15 ENCOUNTER — Encounter (HOSPITAL_COMMUNITY): Payer: Self-pay | Admitting: Emergency Medicine

## 2016-08-15 ENCOUNTER — Emergency Department (HOSPITAL_COMMUNITY)
Admission: EM | Admit: 2016-08-15 | Discharge: 2016-08-15 | Disposition: A | Discharge status: Home / Self Care | Payer: Medicaid Other | Attending: Emergency Medicine | Admitting: Emergency Medicine

## 2016-08-15 DIAGNOSIS — Z79899 Other long term (current) drug therapy: Secondary | ICD-10-CM | POA: Insufficient documentation

## 2016-08-15 DIAGNOSIS — R110 Nausea: Secondary | ICD-10-CM | POA: Diagnosis present

## 2016-08-15 DIAGNOSIS — R740 Nonspecific elevation of levels of transaminase and lactic acid dehydrogenase [LDH]: Secondary | ICD-10-CM | POA: Insufficient documentation

## 2016-08-15 DIAGNOSIS — F17210 Nicotine dependence, cigarettes, uncomplicated: Secondary | ICD-10-CM | POA: Diagnosis not present

## 2016-08-15 DIAGNOSIS — R7401 Elevation of levels of liver transaminase levels: Secondary | ICD-10-CM

## 2016-08-15 LAB — URINALYSIS, ROUTINE W REFLEX MICROSCOPIC
GLUCOSE, UA: NEGATIVE mg/dL
KETONES UR: NEGATIVE mg/dL
NITRITE: NEGATIVE
PROTEIN: 30 mg/dL — AB
Specific Gravity, Urine: 1.039 — ABNORMAL HIGH (ref 1.005–1.030)
pH: 5.5 (ref 5.0–8.0)

## 2016-08-15 LAB — CBC WITH DIFFERENTIAL/PLATELET
Basophils Absolute: 0 10*3/uL (ref 0.0–0.1)
Basophils Relative: 0 %
EOS PCT: 0 %
Eosinophils Absolute: 0 10*3/uL (ref 0.0–0.7)
HCT: 37.4 % (ref 36.0–46.0)
HEMOGLOBIN: 12.5 g/dL (ref 12.0–15.0)
LYMPHS ABS: 0.5 10*3/uL — AB (ref 0.7–4.0)
LYMPHS PCT: 10 %
MCH: 29.2 pg (ref 26.0–34.0)
MCHC: 33.4 g/dL (ref 30.0–36.0)
MCV: 87.4 fL (ref 78.0–100.0)
Monocytes Absolute: 0.3 10*3/uL (ref 0.1–1.0)
Monocytes Relative: 6 %
Neutro Abs: 4.2 10*3/uL (ref 1.7–7.7)
Neutrophils Relative %: 84 %
Platelets: 234 10*3/uL (ref 150–400)
RBC: 4.28 MIL/uL (ref 3.87–5.11)
RDW: 13.8 % (ref 11.5–15.5)
WBC: 5 10*3/uL (ref 4.0–10.5)

## 2016-08-15 LAB — COMPREHENSIVE METABOLIC PANEL
ALK PHOS: 136 U/L — AB (ref 38–126)
ALT: 57 U/L — AB (ref 14–54)
AST: 83 U/L — ABNORMAL HIGH (ref 15–41)
Albumin: 4.1 g/dL (ref 3.5–5.0)
Anion gap: 9 (ref 5–15)
BUN: 8 mg/dL (ref 6–20)
CALCIUM: 8.7 mg/dL — AB (ref 8.9–10.3)
CO2: 24 mmol/L (ref 22–32)
CREATININE: 0.7 mg/dL (ref 0.44–1.00)
Chloride: 101 mmol/L (ref 101–111)
Glucose, Bld: 109 mg/dL — ABNORMAL HIGH (ref 65–99)
Potassium: 3.3 mmol/L — ABNORMAL LOW (ref 3.5–5.1)
Sodium: 134 mmol/L — ABNORMAL LOW (ref 135–145)
Total Bilirubin: 0.7 mg/dL (ref 0.3–1.2)
Total Protein: 8.3 g/dL — ABNORMAL HIGH (ref 6.5–8.1)

## 2016-08-15 LAB — URINE MICROSCOPIC-ADD ON

## 2016-08-15 LAB — POC URINE PREG, ED: PREG TEST UR: NEGATIVE

## 2016-08-15 LAB — LIPASE, BLOOD: LIPASE: 17 U/L (ref 11–51)

## 2016-08-15 MED ORDER — OMEPRAZOLE 20 MG PO CPDR: 20.0000 mg | DELAYED_RELEASE_CAPSULE | Freq: Every day | ORAL | 0 refills | Status: DC

## 2016-08-15 MED ORDER — SODIUM CHLORIDE 0.9 % IV BOLUS (SEPSIS)
1000.0000 mL | Freq: Once | INTRAVENOUS | Status: AC
  Administered 2016-08-15: 1000 mL via INTRAVENOUS

## 2016-08-15 MED ORDER — KETOROLAC TROMETHAMINE 30 MG/ML IJ SOLN
30.0000 mg | Freq: Once | INTRAMUSCULAR | Status: AC
Start: 2016-08-15 — End: 2016-08-15
  Administered 2016-08-15: 30 mg via INTRAVENOUS
  Filled 2016-08-15: qty 1

## 2016-08-15 MED ORDER — GI COCKTAIL ~~LOC~~
30.0000 mL | Freq: Once | ORAL | Status: AC
  Administered 2016-08-15: 30 mL via ORAL
  Filled 2016-08-15: qty 30

## 2016-08-15 MED ORDER — ONDANSETRON HCL 4 MG/2ML IJ SOLN
4.0000 mg | Freq: Once | INTRAMUSCULAR | Status: AC
  Administered 2016-08-15: 4 mg via INTRAVENOUS
  Filled 2016-08-15: qty 2

## 2016-08-15 MED ORDER — ONDANSETRON 4 MG PO TBDP: 4.0000 mg | ORAL_TABLET | Freq: Three times a day (TID) | ORAL | 0 refills | Status: DC | PRN

## 2016-08-15 MED ORDER — FAMOTIDINE IN NACL 20-0.9 MG/50ML-% IV SOLN
20.0000 mg | Freq: Once | INTRAVENOUS | Status: AC
  Administered 2016-08-15: 20 mg via INTRAVENOUS
  Filled 2016-08-15: qty 50

## 2016-08-15 MED ORDER — METOCLOPRAMIDE HCL 5 MG/ML IJ SOLN
10.0000 mg | Freq: Once | INTRAMUSCULAR | Status: AC
  Administered 2016-08-15: 10 mg via INTRAVENOUS
  Filled 2016-08-15: qty 2

## 2016-08-15 NOTE — ED Provider Notes (Signed)
[facility]-EMERGENCY DEPT Provider Note   CSN: [id number] Arrival date & time: 08/15/16  0850     History   Chief Complaint Chief Complaint  Patient presents with  . Back Pain    lower   . Dysuria    HPI Patty Valdez is a 30 y.o. female.  HPI Patient presents with ongoing illness. This illness began about one week ago, initially with nausea, anorexia, diarrhea, generalized discomfort. Patient saw an urgent care provider, was diagnosed with viral GI illness. Since that time she has had persistent nausea, anorexia, weakness, and over the past 24 hours has developed fever, with maximum temperature 101, as well as now having diffuse lower extremity myalgia, and new mid back pain. The back pain is sore, moderate, nonradiating, mid thoracic. There is also new dysuria. Patient has not been taking any medication for symptom relief. She states that she is generally well, had been well until about one week ago. Patient does smoke, recent he started a new job.   Smoking cessation provided, particularly in light of this patient's evaluation in the ED.    Past Medical History:  Diagnosis Date  . Abdominal pain   . Anemia 10 yrs ago  . Anxiety   . Complication of anesthesia 04-08-2009   low blood pressure after epidural, had to take iv meds for  . Gallstones   . Genital herpes    no current meds for  . Migraine   . Nausea   . Obesity   . Vaginal bleeding     Patient Active Problem List   Diagnosis Date Noted  . MDD (major depressive disorder), recurrent severe, without psychosis (HCC) 06/11/2015  . Pregnancy 05/15/2015  . S/P cesarean section 05/15/2015  . [redacted] weeks gestation of pregnancy   . Abnormal fetal ultrasound   . Encounter for fetal anatomic survey   . Low lying placenta without hemorrhage, antepartum   . [redacted] weeks gestation of pregnancy   . MDD (major depressive disorder), recurrent episode, severe (HCC) 06/14/2014  . Drug overdose 06/11/2014  . Decreased level  of consciousness 06/11/2014  . Hypotension due to drugs 06/11/2014  . Suicide attempt by drug ingestion (HCC) 06/11/2014  . Poisoning by carbamazepine 06/11/2014  . Chronic cholecystitis with calculus 01/08/2012  . IRREGULAR MENSES 08/11/2009  . OBESITY, UNSPECIFIED 07/15/2009  . ANXIETY STATE, UNSPECIFIED 07/15/2009  . HERPES SIMPLEX INFECTION 04/20/2008  . NICOTINE ADDICTION 04/13/2008    Past Surgical History:  Procedure Laterality Date  . CESAREAN SECTION N/A 05/15/2015   Procedure: CESAREAN SECTION;  Surgeon: Kathreen Cosier, MD;  Location: [facility];  Service: Obstetrics;  Laterality: N/A;  . CHOLECYSTECTOMY    . NO PAST SURGERIES      OB History    Gravida Para Term Preterm AB Living   3 3 3     3    SAB TAB Ectopic Multiple Live Births         0 3       Home Medications    Prior to Admission medications   Medication Sig Start Date End Date Taking? Authorizing Provider  aspirin-acetaminophen-caffeine (EXCEDRIN MIGRAINE) [phone] MG tablet Take 2-3 tablets by mouth every 8 (eight) hours as needed for headache.   Yes Historical Provider, MD  Ca Carbonate-Mag Hydroxide (ROLAIDS PO) Take 3-4 tablets by mouth 2 (two) times daily as needed (heartburn).   Yes Historical Provider, MD  calcium carbonate (TUMS - DOSED IN MG ELEMENTAL CALCIUM) 500 MG chewable tablet Chew 4 tablets by  mouth 2 (two) times daily as needed for indigestion or heartburn.   Yes Historical Provider, MD  QUEtiapine (SEROQUEL) 25 MG tablet Take 25 mg by mouth at bedtime as needed (sleep/anxiety).   Yes Historical Provider, MD  SUBOXONE 8-2 MG FILM Place 1 Film under the tongue 3 (three) times daily. 08/07/16  Yes Historical Provider, MD  metroNIDAZOLE (FLAGYL) 500 MG tablet Take 1 tablet (500 mg total) by mouth 2 (two) times daily. Patient not taking: Reported on 08/15/2016 07/29/16   Donette Larry, CNM    Family History Family History  Problem Relation Age of Onset  . Cancer Maternal Grandfather      prostate  . Hypertension Mother   . Diabetes Mother   . Neuropathy Mother     Social History Social History  Substance Use Topics  . Smoking status: Current Every Day Smoker    Packs/day: 0.50    Years: 10.00    Types: Cigarettes  . Smokeless tobacco: Never Used  . Alcohol use No     Comment: last drink was 1 yr ago     Allergies   Review of patient's allergies indicates no known allergies.   Review of Systems Review of Systems  Constitutional:       Per HPI, otherwise negative  HENT:       Per HPI, otherwise negative  Respiratory:       Per HPI, otherwise negative  Cardiovascular:       Per HPI, otherwise negative  Gastrointestinal: Positive for diarrhea and nausea. Negative for abdominal distention, abdominal pain and vomiting.  Endocrine:       Negative aside from HPI  Genitourinary: Positive for dysuria. Negative for hematuria.  Musculoskeletal:       Per HPI, otherwise negative  Skin: Negative.   Neurological: Positive for weakness. Negative for syncope.     Physical Exam Updated Vital Signs BP 103/58 (BP Location: Left Arm)   Pulse (!) 59   Temp 98.8 F (37.1 C) (Oral)   Resp 16   LMP 07/15/2016   SpO2 100%   Physical Exam  Constitutional: She is oriented to person, place, and time. She appears well-developed and well-nourished. No distress.  HENT:  Head: Normocephalic and atraumatic.  Eyes: Conjunctivae and EOM are normal.  Cardiovascular: Normal rate and regular rhythm.   Pulmonary/Chest: Effort normal and breath sounds normal. No stridor. No respiratory distress.  Abdominal: She exhibits no distension. There is no tenderness. There is no guarding.  Musculoskeletal: She exhibits no edema.  Neurological: She is alert and oriented to person, place, and time. No cranial nerve deficit.  Skin: Skin is warm and dry.  Psychiatric: She has a normal mood and affect.  Nursing note and vitals reviewed.    ED Treatments / Results  Labs (all labs  ordered are listed, but only abnormal results are displayed) Labs Reviewed  URINALYSIS, ROUTINE W REFLEX MICROSCOPIC (NOT AT [facility]) - Abnormal; Notable for the following:       Result Value   Color, Urine ORANGE (*)    APPearance CLOUDY (*)    Specific Gravity, Urine 1.039 (*)    Hgb urine dipstick SMALL (*)    Bilirubin Urine SMALL (*)    Protein, ur 30 (*)    Leukocytes, UA TRACE (*)    All other components within normal limits  CBC WITH DIFFERENTIAL/PLATELET - Abnormal; Notable for the following:    Lymphs Abs 0.5 (*)    All other components within normal limits  COMPREHENSIVE METABOLIC PANEL - Abnormal; Notable for the following:    Sodium 134 (*)    Potassium 3.3 (*)    Glucose, Bld 109 (*)    Calcium 8.7 (*)    Total Protein 8.3 (*)    AST 83 (*)    ALT 57 (*)    Alkaline Phosphatase 136 (*)    All other components within normal limits  URINE MICROSCOPIC-ADD ON - Abnormal; Notable for the following:    Squamous Epithelial / LPF 6-30 (*)    Bacteria, UA FEW (*)    All other components within normal limits  LIPASE, BLOOD  POC URINE PREG, ED     Radiology Dg Chest 2 View  Result Date: 08/15/2016 CLINICAL DATA:  Weakness and myalgia.  Fever in a smoker. EXAM: CHEST  2 VIEW COMPARISON:  02/10/2011 FINDINGS: Normal heart size and mediastinal contours. No acute infiltrate or edema. No effusion or pneumothorax. No acute osseous findings. IMPRESSION: Negative for pneumonia. Electronically Signed   By: Marnee SpringJonathon  Watts M.D.   On: 08/15/2016 10:49   [place] Abdomen Complete  Result Date: 08/15/2016 CLINICAL DATA:  30 year old female with right flank pain for the past 3- 4 days and elevated liver enzymes. Past history of cholecystectomy. EXAM: ABDOMEN ULTRASOUND COMPLETE COMPARISON:  Prior CT abdomen/ pelvis 06/02/2015 ; prior abdominal ultrasound 12/13/2011 FINDINGS: Gallbladder: The gallbladder is surgically absent Common bile duct: Diameter: 7.5 mm Liver: No discrete hepatic lesion  identified. The main portal vein is patent with normal hepatopetal flow. Normal parenchymal echogenicity. IVC: No abnormality visualized. Pancreas: Visualized portion unremarkable. Spleen: The spleen is enlarged measuring 13.3 cm in length and 593 cubic cm in volume. Right Kidney: Length: 10.5 cm. Echogenicity within normal limits. No mass or hydronephrosis visualized. Left Kidney: Length: 11.6 cm. Echogenicity within normal limits. No mass or hydronephrosis visualized. Abdominal aorta: No aneurysm visualized. Other findings: None. IMPRESSION: 1. Surgical changes of prior cholecystectomy. 2. The common bile duct is borderline dilated at 7.5 mm. Given slight differences in technique, this is not significantly larger than 6.8 mm measured on 12/13/2011. 3. Patent main portal vein with normal directional flow. 4. Splenomegaly of uncertain clinical significance. Electronically Signed   By: Malachy MoanHeath  McCullough M.D.   On: 08/15/2016 13:05    Procedures Procedures (including critical care time)  Medications Ordered in ED Medications  sodium chloride 0.9 % bolus 1,000 mL (not administered)  famotidine (PEPCID) IVPB 20 mg premix (not administered)  metoCLOPramide (REGLAN) injection 10 mg (not administered)  gi cocktail (Maalox,Lidocaine,Donnatal) (30 mLs Oral Given 08/15/16 1045)  ketorolac (TORADOL) 30 MG/ML injection 30 mg (30 mg Intravenous Given 08/15/16 1055)  ondansetron (ZOFRAN) injection 4 mg (4 mg Intravenous Given 08/15/16 1053)  sodium chloride 0.9 % bolus 1,000 mL (1,000 mLs Intravenous New Bag/Given 08/15/16 1053)     Initial Impression / Assessment and Plan / ED Course  I have reviewed the triage vital signs and the nursing notes.  Pertinent labs & imaging results that were available during my care of the patient were reviewed by me and considered in my medical decision making (see chart for details).  Clinical Course    After initial results revealed what reassessed the patient. She  continues to complain of upper abdominal discomfort, right flank pain. We reviewed labs concerning for hepatic inflammation, ultrasound pending.   Review notable for similar abnormality in her liver function tests 2 years ago; patient had a cholecystectomy at that time. She states that she never followed up with gastroenterology.  2:45 PM Patient appears substantially better. I discussed all findings with patient and her colleague. Specifically discussed the ultrasound findings, then discussed the need for following up with GI.  Final Clinical Impressions(s) / ED Diagnoses  Patient presents with concern of ongoing flank, back, and sometimes abdominal pain. Patient is a notable history of prior cholecystectomy. Here, the patient is awake and alert, but continues to have ongoing discomfort. No evidence for peritonitis, acute hepatobiliary dysfunction, though there is elevated transaminase, and some suspicion for ongoing viral process. Given persistent symptoms, she improved substantially here with fluids, antiemetics, analgesia, patient was discharged to follow-up with gastroenterology.    Gerhard Munch, MD 08/15/16 [phone]

## 2016-08-15 NOTE — ED Notes (Signed)
US at bedside

## 2016-08-15 NOTE — Discharge Instructions (Signed)
As discussed, today's evaluation is largely reassuring per However, with your ongoing discomfort, as well as your abnormal liver function panel, and is important that you follow-up with our gastroenterology colleagues.

## 2017-03-29 ENCOUNTER — Encounter (HOSPITAL_COMMUNITY): Payer: Self-pay | Admitting: Emergency Medicine

## 2017-03-29 ENCOUNTER — Ambulatory Visit (HOSPITAL_COMMUNITY)
Admission: EM | Admit: 2017-03-29 | Discharge: 2017-03-29 | Disposition: A | Discharge status: Home / Self Care | Payer: Medicaid Other | Attending: Internal Medicine | Admitting: Internal Medicine

## 2017-03-29 DIAGNOSIS — L0291 Cutaneous abscess, unspecified: Secondary | ICD-10-CM | POA: Diagnosis not present

## 2017-03-29 DIAGNOSIS — Z7982 Long term (current) use of aspirin: Secondary | ICD-10-CM | POA: Insufficient documentation

## 2017-03-29 DIAGNOSIS — L02414 Cutaneous abscess of left upper limb: Secondary | ICD-10-CM | POA: Diagnosis present

## 2017-03-29 DIAGNOSIS — F419 Anxiety disorder, unspecified: Secondary | ICD-10-CM | POA: Diagnosis not present

## 2017-03-29 DIAGNOSIS — F17210 Nicotine dependence, cigarettes, uncomplicated: Secondary | ICD-10-CM | POA: Insufficient documentation

## 2017-03-29 DIAGNOSIS — Z79899 Other long term (current) drug therapy: Secondary | ICD-10-CM | POA: Insufficient documentation

## 2017-03-29 MED ORDER — IBUPROFEN 800 MG PO TABS
ORAL_TABLET | ORAL | Status: AC
  Filled 2017-03-29: qty 1

## 2017-03-29 MED ORDER — SULFAMETHOXAZOLE-TRIMETHOPRIM 800-160 MG PO TABS: 1.0000 | ORAL_TABLET | Freq: Two times a day (BID) | ORAL | 0 refills | Status: DC

## 2017-03-29 MED ORDER — IBUPROFEN 800 MG PO TABS
800.0000 mg | ORAL_TABLET | Freq: Once | ORAL | Status: AC
  Administered 2017-03-29: 800 mg via ORAL

## 2017-03-29 NOTE — Discharge Instructions (Signed)
Keep the dressing on for 2 days. Return to the urgent care 2 days for wound check and packing removal. Take the antibiotic as directed. Do not get the dressing wet.

## 2017-03-29 NOTE — ED Provider Notes (Signed)
CSN: [id number]     Arrival date & time 03/29/17  1712 History   First MD Initiated Contact with Patient 03/29/17 1853     Chief Complaint  Patient presents with  . Abscess   (Consider location/radiation/quality/duration/timing/severity/associated sxs/prior Treatment) Patient states she noticed a small red bump about one week ago to the left proximal forearm extensor surface. Readily getting larger but much worse yesterday and growing faster. She complains of a very painful and tender red lesion to the forearm. He states her significant other has a history of MRSA and MRSA lesions.      Past Medical History:  Diagnosis Date  . Abdominal pain   . Anemia 10 yrs ago  . Anxiety   . Complication of anesthesia 04-08-2009   low blood pressure after epidural, had to take iv meds for  . Gallstones   . Genital herpes    no current meds for  . Migraine   . Nausea   . Obesity   . Vaginal bleeding    Past Surgical History:  Procedure Laterality Date  . CESAREAN SECTION N/A 05/15/2015   Procedure: CESAREAN SECTION;  Surgeon: Kathreen CosierBernard A Marshall, MD;  Location: [facility];  Service: Obstetrics;  Laterality: N/A;  . CHOLECYSTECTOMY    . NO PAST SURGERIES     Family History  Problem Relation Age of Onset  . Cancer Maternal Grandfather        prostate  . Hypertension Mother   . Diabetes Mother   . Neuropathy Mother    Social History  Substance Use Topics  . Smoking status: Current Every Day Smoker    Packs/day: 0.50    Years: 10.00    Types: Cigarettes  . Smokeless tobacco: Never Used  . Alcohol use No     Comment: last drink was 1 yr ago   OB History    Gravida Para Term Preterm AB Living   3 3 3     3    SAB TAB Ectopic Multiple Live Births         0 3     Review of Systems  Constitutional: Positive for activity change.  Musculoskeletal: Negative.   Skin: Positive for color change.       As per history of present illness  Neurological: Negative.   All other systems reviewed  and are negative.   Allergies  Patient has no known allergies.  Home Medications   Prior to Admission medications   Medication Sig Start Date End Date Taking? Authorizing Provider  aspirin-acetaminophen-caffeine (EXCEDRIN MIGRAINE) [phone] [id number] MG tablet Take 2-3 tablets by mouth every 8 (eight) hours as needed for headache.    [provider]  Ca Carbonate-Mag Hydroxide (ROLAIDS PO) Take 3-4 tablets by mouth 2 (two) times daily as needed (heartburn).    [provider]  calcium carbonate (TUMS - DOSED IN MG ELEMENTAL CALCIUM) 500 MG chewable tablet Chew 4 tablets by mouth 2 (two) times daily as needed for indigestion or heartburn.    [provider]  omeprazole (PRILOSEC) 20 MG capsule Take 1 capsule (20 mg total) by mouth daily. Take one tablet daily 08/15/16   Gerhard MunchLockwood, Robert, MD  ondansetron (ZOFRAN ODT) 4 MG disintegrating tablet Take 1 tablet (4 mg total) by mouth every 8 (eight) hours as needed for nausea or vomiting. 08/15/16   Gerhard MunchLockwood, Robert, MD  QUEtiapine (SEROQUEL) 25 MG tablet Take 25 mg by mouth at bedtime as needed (sleep/anxiety).    [provider]  SUBOXONE 8-2 MG  FILM Place 1 Film under the tongue 3 (three) times daily. 08/07/16   [provider]  sulfamethoxazole-trimethoprim (BACTRIM DS,SEPTRA DS) 800-160 MG tablet Take 1 tablet by mouth 2 (two) times daily. 03/29/17 04/05/17  Hayden Rasmussen, NP   Meds Ordered and Administered this Visit   Medications  ibuprofen (ADVIL,MOTRIN) tablet 800 mg (800 mg Oral Given 03/29/17 1910)    BP (!) 134/96 (BP Location: Right Arm)   Pulse 88   Temp 98.2 F (36.8 C) (Oral)   Resp 16   LMP 03/20/2017 (Approximate)   SpO2 100%   Breastfeeding? No  No data found.   Physical Exam  Constitutional: She is oriented to person, place, and time. She appears well-developed and well-nourished. No distress.  Eyes: EOM are normal.  Neck: Neck supple.  Cardiovascular: Normal rate.   Pulmonary/Chest:  Effort normal. No respiratory distress.  Musculoskeletal: Normal range of motion. She exhibits tenderness. She exhibits no edema.  There is an approximately 2 and half centimeter bubble of fluctuance surrounded by dark  erythem well raised above the skin line. There is induration extending another 2 cm from the fluctuant center lesion.  Neurological: She is alert and oriented to person, place, and time. She exhibits normal muscle tone.  Skin: Skin is warm and dry.  Psychiatric: She has a normal mood and affect.  Nursing note and vitals reviewed.   Urgent Care Course     Procedures (including critical care time)  Labs Review Labs Reviewed  AEROBIC CULTURE (SUPERFICIAL SPECIMEN)    Imaging Review No results found.   Visual Acuity Review  Right Eye Distance:   Left Eye Distance:   Bilateral Distance:    Right Eye Near:   Left Eye Near:    Bilateral Near:         MDM   1. Abscess    Keep the dressing on for 2 days. Return to the urgent care 2 days for wound check and packing removal. Take the antibiotic as directed. Do not get the dressing wet. Meds ordered this encounter  Medications  . ibuprofen (ADVIL,MOTRIN) tablet 800 mg  . sulfamethoxazole-trimethoprim (BACTRIM DS,SEPTRA DS) 800-160 MG tablet    Sig: Take 1 tablet by mouth 2 (two) times daily.    Dispense:  14 tablet    Refill:  0    Order Specific Question:   Supervising Provider    Answer:   Eustace Moore [[id number]]       Hayden Rasmussen, NP 03/29/17 Royal Hawthorn, NP 03/29/17 [phone]

## 2017-03-29 NOTE — ED Triage Notes (Signed)
Patient c/o abscess on left forearm x 1 week. Has progressively gotten worse. Is sore to the touch. Patient is tearful during triage.

## 2017-03-31 ENCOUNTER — Inpatient Hospital Stay (HOSPITAL_COMMUNITY)
Admission: EM | Admit: 2017-03-31 | Discharge: 2017-04-05 | DRG: 603 | Disposition: A | Discharge status: Home / Self Care | Payer: Medicaid Other | Attending: Family Medicine | Admitting: Family Medicine

## 2017-03-31 ENCOUNTER — Inpatient Hospital Stay (HOSPITAL_COMMUNITY): Payer: Medicaid Other

## 2017-03-31 ENCOUNTER — Encounter (HOSPITAL_COMMUNITY): Payer: Self-pay | Admitting: Emergency Medicine

## 2017-03-31 DIAGNOSIS — E876 Hypokalemia: Secondary | ICD-10-CM | POA: Diagnosis present

## 2017-03-31 DIAGNOSIS — E669 Obesity, unspecified: Secondary | ICD-10-CM | POA: Diagnosis present

## 2017-03-31 DIAGNOSIS — F411 Generalized anxiety disorder: Secondary | ICD-10-CM | POA: Diagnosis present

## 2017-03-31 DIAGNOSIS — R52 Pain, unspecified: Secondary | ICD-10-CM | POA: Diagnosis not present

## 2017-03-31 DIAGNOSIS — Z79899 Other long term (current) drug therapy: Secondary | ICD-10-CM

## 2017-03-31 DIAGNOSIS — Z792 Long term (current) use of antibiotics: Secondary | ICD-10-CM | POA: Diagnosis not present

## 2017-03-31 DIAGNOSIS — F17210 Nicotine dependence, cigarettes, uncomplicated: Secondary | ICD-10-CM | POA: Diagnosis present

## 2017-03-31 DIAGNOSIS — Z7982 Long term (current) use of aspirin: Secondary | ICD-10-CM

## 2017-03-31 DIAGNOSIS — L02414 Cutaneous abscess of left upper limb: Secondary | ICD-10-CM | POA: Diagnosis present

## 2017-03-31 DIAGNOSIS — K219 Gastro-esophageal reflux disease without esophagitis: Secondary | ICD-10-CM | POA: Diagnosis present

## 2017-03-31 DIAGNOSIS — L0291 Cutaneous abscess, unspecified: Secondary | ICD-10-CM

## 2017-03-31 DIAGNOSIS — F418 Other specified anxiety disorders: Secondary | ICD-10-CM | POA: Diagnosis present

## 2017-03-31 DIAGNOSIS — L03114 Cellulitis of left upper limb: Principal | ICD-10-CM

## 2017-03-31 DIAGNOSIS — B1920 Unspecified viral hepatitis C without hepatic coma: Secondary | ICD-10-CM | POA: Diagnosis present

## 2017-03-31 DIAGNOSIS — R748 Abnormal levels of other serum enzymes: Secondary | ICD-10-CM | POA: Diagnosis present

## 2017-03-31 LAB — COMPREHENSIVE METABOLIC PANEL
ALBUMIN: 3.7 g/dL (ref 3.5–5.0)
ALK PHOS: 144 U/L — AB (ref 38–126)
ALT: 68 U/L — ABNORMAL HIGH (ref 14–54)
AST: 29 U/L (ref 15–41)
Anion gap: 10 (ref 5–15)
BILIRUBIN TOTAL: 0.7 mg/dL (ref 0.3–1.2)
BUN: 9 mg/dL (ref 6–20)
CALCIUM: 8.6 mg/dL — AB (ref 8.9–10.3)
CO2: 23 mmol/L (ref 22–32)
Chloride: 98 mmol/L — ABNORMAL LOW (ref 101–111)
Creatinine, Ser: 1.13 mg/dL — ABNORMAL HIGH (ref 0.44–1.00)
GFR calc Af Amer: >60 mL/min (ref >60)
GLUCOSE: 107 mg/dL — AB (ref 65–99)
Potassium: 3.6 mmol/L (ref 3.5–5.1)
Sodium: 131 mmol/L — ABNORMAL LOW (ref 135–145)
TOTAL PROTEIN: 8.7 g/dL — AB (ref 6.5–8.1)

## 2017-03-31 LAB — CBC WITH DIFFERENTIAL/PLATELET
BASOS ABS: 0 10*3/uL (ref 0.0–0.1)
BASOS PCT: 0 %
EOS ABS: 0 10*3/uL (ref 0.0–0.7)
EOS PCT: 0 %
HCT: 36.4 % (ref 36.0–46.0)
Hemoglobin: 12.3 g/dL (ref 12.0–15.0)
Lymphocytes Relative: 18 %
Lymphs Abs: 2.4 10*3/uL (ref 0.7–4.0)
MCH: 30.5 pg (ref 26.0–34.0)
MCHC: 33.8 g/dL (ref 30.0–36.0)
MCV: 90.3 fL (ref 78.0–100.0)
MONO ABS: 1.1 10*3/uL — AB (ref 0.1–1.0)
Monocytes Relative: 8 %
Neutro Abs: 9.8 10*3/uL — ABNORMAL HIGH (ref 1.7–7.7)
Neutrophils Relative %: 74 %
PLATELETS: 239 10*3/uL (ref 150–400)
RBC: 4.03 MIL/uL (ref 3.87–5.11)
RDW: 12.8 % (ref 11.5–15.5)
WBC: 13.3 10*3/uL — ABNORMAL HIGH (ref 4.0–10.5)

## 2017-03-31 LAB — I-STAT CG4 LACTIC ACID, ED: Lactic Acid, Venous: 1.21 mmol/L (ref 0.5–1.9)

## 2017-03-31 MED ORDER — ALBUTEROL SULFATE (2.5 MG/3ML) 0.083% IN NEBU
3.0000 mL | INHALATION_SOLUTION | RESPIRATORY_TRACT | Status: DC | PRN
  Administered 2017-04-02: 3 mL via RESPIRATORY_TRACT
  Filled 2017-03-31: qty 3

## 2017-03-31 MED ORDER — CALCIUM CARBONATE ANTACID 500 MG PO CHEW
1.0000 | CHEWABLE_TABLET | ORAL | Status: AC
Start: 2017-03-31 — End: 2017-03-31
  Administered 2017-03-31: 200 mg via ORAL
  Filled 2017-03-31: qty 1

## 2017-03-31 MED ORDER — ACETAMINOPHEN 650 MG RE SUPP: 650.0000 mg | Freq: Four times a day (QID) | RECTAL | Status: DC | PRN

## 2017-03-31 MED ORDER — IBUPROFEN 400 MG PO TABS
400.0000 mg | ORAL_TABLET | ORAL | Status: AC
  Administered 2017-03-31: 400 mg via ORAL

## 2017-03-31 MED ORDER — MORPHINE SULFATE (PF) 4 MG/ML IV SOLN
4.0000 mg | Freq: Once | INTRAVENOUS | Status: AC
  Administered 2017-03-31: 4 mg via INTRAVENOUS
  Filled 2017-03-31: qty 1

## 2017-03-31 MED ORDER — VANCOMYCIN HCL IN DEXTROSE 1-5 GM/200ML-% IV SOLN
1000.0000 mg | Freq: Two times a day (BID) | INTRAVENOUS | Status: DC
  Administered 2017-03-31 – 2017-04-01 (×3): 1000 mg via INTRAVENOUS
  Filled 2017-03-31 (×3): qty 200

## 2017-03-31 MED ORDER — SODIUM CHLORIDE 0.9 % IV BOLUS (SEPSIS)
1000.0000 mL | Freq: Once | INTRAVENOUS | Status: AC
  Administered 2017-03-31: 1000 mL via INTRAVENOUS

## 2017-03-31 MED ORDER — SODIUM CHLORIDE 0.9 % IV SOLN
INTRAVENOUS | Status: DC
  Administered 2017-03-31 – 2017-04-05 (×7): via INTRAVENOUS

## 2017-03-31 MED ORDER — HYDROCODONE-ACETAMINOPHEN 5-325 MG PO TABS
1.0000 | ORAL_TABLET | Freq: Four times a day (QID) | ORAL | Status: DC | PRN
  Administered 2017-03-31 – 2017-04-05 (×15): 2 via ORAL
  Filled 2017-03-31 (×16): qty 2

## 2017-03-31 MED ORDER — ENOXAPARIN SODIUM 40 MG/0.4ML ~~LOC~~ SOLN
40.0000 mg | SUBCUTANEOUS | Status: DC
  Administered 2017-04-03 – 2017-04-04 (×2): 40 mg via SUBCUTANEOUS
  Filled 2017-03-31 (×4): qty 0.4

## 2017-03-31 MED ORDER — ONDANSETRON HCL 4 MG PO TABS: 4.0000 mg | ORAL_TABLET | Freq: Four times a day (QID) | ORAL | Status: DC | PRN

## 2017-03-31 MED ORDER — ALBUTEROL SULFATE (2.5 MG/3ML) 0.083% IN NEBU: 3.0000 mL | INHALATION_SOLUTION | RESPIRATORY_TRACT | Status: DC

## 2017-03-31 MED ORDER — ONDANSETRON HCL 4 MG/2ML IJ SOLN: 4.0000 mg | Freq: Four times a day (QID) | INTRAMUSCULAR | Status: DC | PRN

## 2017-03-31 MED ORDER — ACETAMINOPHEN 325 MG PO TABS
650.0000 mg | ORAL_TABLET | Freq: Four times a day (QID) | ORAL | Status: DC | PRN
  Administered 2017-04-02 – 2017-04-05 (×2): 650 mg via ORAL
  Filled 2017-03-31 (×2): qty 2

## 2017-03-31 MED ORDER — DIPHENHYDRAMINE HCL 50 MG/ML IJ SOLN
25.0000 mg | Freq: Four times a day (QID) | INTRAMUSCULAR | Status: DC | PRN
  Administered 2017-03-31 – 2017-04-02 (×2): 25 mg via INTRAVENOUS
  Filled 2017-03-31 (×3): qty 1

## 2017-03-31 MED ORDER — MORPHINE SULFATE (PF) 4 MG/ML IV SOLN
4.0000 mg | INTRAVENOUS | Status: DC | PRN
  Administered 2017-04-01 – 2017-04-05 (×20): 4 mg via INTRAVENOUS
  Filled 2017-03-31 (×24): qty 1

## 2017-03-31 NOTE — ED Triage Notes (Signed)
Pt reports seen at urgent care and had an I and D done of abscess to left arm. Pt reports increased redness and inability to move arm. Pt presents with coban bandage with redness to left arm extending up to humerus area, which pt said is new.

## 2017-03-31 NOTE — Progress Notes (Signed)
Pharmacy Antibiotic Note  Patty SickleHeather E Valdez is a 31 y.o. female admitted on 03/31/2017 with cellulitis.  Pharmacy has been consulted for vancomycin dosing.  Patient had I&D of left arm done at urgent care on 5/11- with increased redness (extending up to humerus area) and inability to move arm. She was given a prescription for Bactrim at urgent care per records.  Plan: Vancomycin 1g IV q12h- shooting for trough ~7515mcg/mL given I&D of abscess recently Follow c/s, clinical progression, renal function  Height: 5\' 2"  (157.5 cm) Weight: 170 lb (77.1 kg) IBW/kg (Calculated) : 50.1  Temp (24hrs), Avg:97.7 F (36.5 C), Min:97.7 F (36.5 C), Max:97.7 F (36.5 C)   Recent Labs Lab 03/31/17 1835 03/31/17 1902  WBC 13.3*  --   CREATININE 1.13*  --   LATICACIDVEN  --  1.21    Estimated Creatinine Clearance: 69.4 mL/min (A) (by C-G formula based on SCr of 1.13 mg/dL (H)).    No Known Allergies  Antimicrobials this admission: vancomycin 5/13 >>    Dose adjustments this admission: n/a  Microbiology results: none  Thank you for allowing pharmacy to be a part of this patient's care.  Jasim Harari 03/31/2017 9:10 PM

## 2017-03-31 NOTE — H&P (Signed)
History and Physical    Patty Valdez ZOX:[id number] DOB: August 08, 1986 DOA: 03/31/2017  Referring MD/NP/PA: Elizabeth Sauer, PA-C PCP: Patient, No Pcp Per  Patient coming from: Home   Chief Complaint: Left arm pain and  swelling   HPI: Patty Valdez is a 31 y.o. female with medical history significant of remote h/o IVDA, hep C, depression, suicide attempt by drug overdose; who presents with left arm pain and swelling. Seen initially in the emergency department 2 days ago for what was diagnosis as an abscess. She is unsure of what initially caused a small bump to appear on her left forearm.  While in the emergency department she had undergone I&D of an abscess, started on Bactrim, and was told to follow-up in 2 days. Reports taking the Bactrim as advised, however noted increased redness and swelling of the left arm to the mid bicep and proximal forearm. Reports being unable to move the affected extremity without significant pain. Associated symptoms included puslike drainage, foul odor, fever up to 101F at home, nausea, vomiting, and headache. Review of records shows wound cultures were obtained 2 days ago and grew out strep pyogenes.  ED Course: Upon admission to the emergency department patient was noted have vital signs relatively within normal limits. Labs revealed WBC 13.3, sodium 131, potassium 3.6, chloride 98, CO2 23, BUN 9, Cr 1.13, and lactic acid 1.21.   Review of Systems: As per HPI otherwise 10 point review of systems negative.   Past Medical History:  Diagnosis Date  . Abdominal pain   . Anemia 10 yrs ago  . Anxiety   . Complication of anesthesia 04-08-2009   low blood pressure after epidural, had to take iv meds for  . Gallstones   . Genital herpes    no current meds for  . Migraine   . Nausea   . Obesity   . Vaginal bleeding     Past Surgical History:  Procedure Laterality Date  . CESAREAN SECTION N/A 05/15/2015   Procedure: CESAREAN SECTION;  Surgeon: Kathreen Cosier, MD;  Location: [facility];  Service: Obstetrics;  Laterality: N/A;  . CHOLECYSTECTOMY    . NO PAST SURGERIES       reports that she has been smoking Cigarettes.  She has a 5.00 pack-year smoking history. She has never used smokeless tobacco. She reports that she uses drugs, including Marijuana, about 2 times per week. She reports that she does not drink alcohol.  No Known Allergies  Family History  Problem Relation Age of Onset  . Cancer Maternal Grandfather        prostate  . Hypertension Mother   . Diabetes Mother   . Neuropathy Mother     Prior to Admission medications   Medication Sig Start Date End Date Taking? Authorizing Provider  aspirin-acetaminophen-caffeine (EXCEDRIN MIGRAINE) [phone] MG tablet Take 2-3 tablets by mouth every 8 (eight) hours as needed for headache.    [provider]  Ca Carbonate-Mag Hydroxide (ROLAIDS PO) Take 3-4 tablets by mouth 2 (two) times daily as needed (heartburn).    [provider]  calcium carbonate (TUMS - DOSED IN MG ELEMENTAL CALCIUM) 500 MG chewable tablet Chew 4 tablets by mouth 2 (two) times daily as needed for indigestion or heartburn.    [provider]  omeprazole (PRILOSEC) 20 MG capsule Take 1 capsule (20 mg total) by mouth daily. Take one tablet daily 08/15/16   Gerhard Munch, MD  ondansetron (ZOFRAN ODT) 4 MG disintegrating tablet Take  1 tablet (4 mg total) by mouth every 8 (eight) hours as needed for nausea or vomiting. 08/15/16   Gerhard MunchLockwood, Robert, MD  QUEtiapine (SEROQUEL) 25 MG tablet Take 25 mg by mouth at bedtime as needed (sleep/anxiety).    [provider]  SUBOXONE 8-2 MG FILM Place 1 Film under the tongue 3 (three) times daily. 08/07/16   [provider]  sulfamethoxazole-trimethoprim (BACTRIM DS,SEPTRA DS) 800-160 MG tablet Take 1 tablet by mouth 2 (two) times daily. 03/29/17 04/05/17  Hayden RasmussenMabe, David, NP    Physical Exam:    Constitutional: Young female who appears to be  in mild distress. Vitals:   03/31/17 1807  BP: 118/66  Pulse: 87  Resp: 18  Temp: 97.7 F (36.5 C)  TempSrc: Oral  SpO2: 98%  Weight: 77.1 kg (170 lb)  Height: 5\' 2"  (1.575 m)   Eyes: PERRL, lids and conjunctivae normal ENMT: Mucous membranes are moist. Posterior pharynx clear of any exudate or lesions. Normal dentition.  Neck: normal, supple, no masses, no thyromegaly Respiratory: clear to auscultation bilaterally, no wheezing, no crackles. Normal respiratory effort. No accessory muscle use.  Cardiovascular: Regular rate and rhythm, no murmurs / rubs / gallops. No extremity edema. 2+ pedal pulses. No carotid bruits.  Abdomen: no tenderness, no masses palpated. No hepatosplenomegaly. Bowel sounds positive.  Musculoskeletal: no clubbing / cyanosis. No joint deformity upper and lower extremities. Full range of motion of the left arm secondary to pain. Skin: Swelling, erythema, and warmth of the left proximal forearm extending to bicep. Draining pus with foul odor. Tenderness to palpation of the affected extremity .  Neurologic: CN 2-12 grossly intact. Sensation intact, DTR normal. Strength 5/5 in all 4.  Psychiatric: Normal judgment and insight. Alert and oriented x 3. Anxious mood.     Labs on Admission: I have personally reviewed following labs and imaging studies  CBC:  Recent Labs Lab 03/31/17 1835  WBC 13.3*  NEUTROABS 9.8*  HGB 12.3  HCT 36.4  MCV 90.3  PLT 239   Basic Metabolic Panel:  Recent Labs Lab 03/31/17 1835  NA 131*  K 3.6  CL 98*  CO2 23  GLUCOSE 107*  BUN 9  CREATININE 1.13*  CALCIUM 8.6*   GFR: Estimated Creatinine Clearance: 69.4 mL/min (A) (by C-G formula based on SCr of 1.13 mg/dL (H)). Liver Function Tests:  Recent Labs Lab 03/31/17 1835  AST 29  ALT 68*  ALKPHOS 144*  BILITOT 0.7  PROT 8.7*  ALBUMIN 3.7   No results for input(s): LIPASE, AMYLASE in the last 168 hours. No results for input(s): AMMONIA in the last 168  hours. Coagulation Profile: No results for input(s): INR, PROTIME in the last 168 hours. Cardiac Enzymes: No results for input(s): CKTOTAL, CKMB, CKMBINDEX, TROPONINI in the last 168 hours. BNP (last 3 results) No results for input(s): PROBNP in the last 8760 hours. HbA1C: No results for input(s): HGBA1C in the last 72 hours. CBG: No results for input(s): GLUCAP in the last 168 hours. Lipid Profile: No results for input(s): CHOL, HDL, LDLCALC, TRIG, CHOLHDL, LDLDIRECT in the last 72 hours. Thyroid Function Tests: No results for input(s): TSH, T4TOTAL, FREET4, T3FREE, THYROIDAB in the last 72 hours. Anemia Panel: No results for input(s): VITAMINB12, FOLATE, FERRITIN, TIBC, IRON, RETICCTPCT in the last 72 hours. Urine analysis:    Component Value Date/Time   COLORURINE ORANGE (A) 08/15/2016 0932   APPEARANCEUR CLOUDY (A) 08/15/2016 0932   LABSPEC 1.039 (H) 08/15/2016 0932   PHURINE 5.5 08/15/2016  0932   GLUCOSEU NEGATIVE 08/15/2016 0932   HGBUR SMALL (A) 08/15/2016 0932   BILIRUBINUR SMALL (A) 08/15/2016 0932   KETONESUR NEGATIVE 08/15/2016 0932   PROTEINUR 30 (A) 08/15/2016 0932   UROBILINOGEN 0.2 06/02/2015 0917   NITRITE NEGATIVE 08/15/2016 0932   LEUKOCYTESUR TRACE (A) 08/15/2016 0932   Sepsis Labs: Recent Results (from the past 240 hour(s))  Wound or Superficial Culture     Status: None (Preliminary result)   Collection Time: 03/29/17  7:31 PM  Result Value Ref Range Status   Specimen Description WOUND LEFT FOREARM  Final   Special Requests NONE  Final   Gram Stain   Final    ABUNDANT WBC PRESENT, PREDOMINANTLY PMN ABUNDANT GRAM POSITIVE COCCI IN CLUSTERS    Culture FEW GROUP A STREP (S.PYOGENES) ISOLATED  Final   Report Status PENDING  Incomplete     Radiological Exams on Admission: No results found.    Assessment/Plan Cellulitis and abscess left arm: Acute. Patient presents with left arm swelling. Previous wound cultures grew out strep pyogenes. Failed  outpatient Bactrim. - Admit to MedSurg - cellulitis protocol initiated - Vancomycin per pharmacy - Superficial ultrasound of the left upper extremity  - Pain control -  Consider need of consult to general surgery if significant abscess seen on ultrasound  History tobacco abuse - offered patient nicotine patch  H/O depression/anxiety: Patient currently not taking any medications.  H/O hepatitis C with Elevated liver enzymes: Mild elevation seen in patient's AP and ALT. - May warrant outpatient follow-up with GI  H/O IVDA: Patient denies any recent history of IV drug use.  - Follow up blood cultures  GERD -  Pharmacy substitution of Protonix  DVT prophylaxis: Lovenox  Code Status: Full  Family Communication:  Discussed plan of care with patient family present at bedside  Disposition Plan:  Possible discharge home in 2-3 days  Consults called: None  Admission status: Inpatient  Clydie Braun MD [facility] Pager [phone]  If 7PM-7AM, please contact night-coverage [email or web address] Password [email or web address]  03/31/2017, 9:22 PM

## 2017-03-31 NOTE — ED Provider Notes (Signed)
[facility]-EMERGENCY DEPT Provider Note   CSN: [id number] Arrival date & time: 03/31/17  1750     History   Chief Complaint Chief Complaint  Patient presents with  . Abscess    HPI Patty Valdez is a 31 y.o. female.  The history is provided by the patient and medical records. No language interpreter was used.   Patty Valdez is a 31 y.o. female who presents to the Emergency Department complaining of worsening abscess to left upper extremity. Patient states she was seen at urgent care on Friday where I&D was performed and she was started on Bactrim. That night, she developed fever of 101. Took OTC pain reliever and went to bed. Started on Bactrim the next morning. Took BID as directed yesterday and today, however redness started spreading up her forearm and pain has intensified. She has felt chills today, however has had no documented fevers. Still having a large amount of drainage from the abscess site. She was told to come to urgent care today for packing to be removed, but since it looked so much worse, she decided to come to ER instead.   Past Medical History:  Diagnosis Date  . Abdominal pain   . Anemia 10 yrs ago  . Anxiety   . Complication of anesthesia 04-08-2009   low blood pressure after epidural, had to take iv meds for  . Gallstones   . Genital herpes    no current meds for  . Migraine   . Nausea   . Obesity   . Vaginal bleeding     Patient Active Problem List   Diagnosis Date Noted  . MDD (major depressive disorder), recurrent severe, without psychosis (HCC) 06/11/2015  . Pregnancy 05/15/2015  . S/P cesarean section 05/15/2015  . [redacted] weeks gestation of pregnancy   . Abnormal fetal ultrasound   . Encounter for fetal anatomic survey   . Low lying placenta without hemorrhage, antepartum   . [redacted] weeks gestation of pregnancy   . MDD (major depressive disorder), recurrent episode, severe (HCC) 06/14/2014  . Drug overdose 06/11/2014  . Decreased level of  consciousness 06/11/2014  . Hypotension due to drugs 06/11/2014  . Suicide attempt by drug ingestion (HCC) 06/11/2014  . Poisoning by carbamazepine 06/11/2014  . Chronic cholecystitis with calculus 01/08/2012  . IRREGULAR MENSES 08/11/2009  . OBESITY, UNSPECIFIED 07/15/2009  . ANXIETY STATE, UNSPECIFIED 07/15/2009  . HERPES SIMPLEX INFECTION 04/20/2008  . NICOTINE ADDICTION 04/13/2008    Past Surgical History:  Procedure Laterality Date  . CESAREAN SECTION N/A 05/15/2015   Procedure: CESAREAN SECTION;  Surgeon: Kathreen Cosier, MD;  Location: [facility];  Service: Obstetrics;  Laterality: N/A;  . CHOLECYSTECTOMY    . NO PAST SURGERIES      OB History    Gravida Para Term Preterm AB Living   3 3 3     3    SAB TAB Ectopic Multiple Live Births         0 3       Home Medications    Prior to Admission medications   Medication Sig Start Date End Date Taking? Authorizing Provider  aspirin-acetaminophen-caffeine (EXCEDRIN MIGRAINE) [phone] MG tablet Take 2-3 tablets by mouth every 8 (eight) hours as needed for headache.    [provider]  Ca Carbonate-Mag Hydroxide (ROLAIDS PO) Take 3-4 tablets by mouth 2 (two) times daily as needed (heartburn).    [provider]  calcium carbonate (TUMS - DOSED IN MG ELEMENTAL CALCIUM) 500 MG  chewable tablet Chew 4 tablets by mouth 2 (two) times daily as needed for indigestion or heartburn.    [provider]  omeprazole (PRILOSEC) 20 MG capsule Take 1 capsule (20 mg total) by mouth daily. Take one tablet daily 08/15/16   Gerhard MunchLockwood, Robert, MD  ondansetron (ZOFRAN ODT) 4 MG disintegrating tablet Take 1 tablet (4 mg total) by mouth every 8 (eight) hours as needed for nausea or vomiting. 08/15/16   Gerhard MunchLockwood, Robert, MD  QUEtiapine (SEROQUEL) 25 MG tablet Take 25 mg by mouth at bedtime as needed (sleep/anxiety).    [provider]  SUBOXONE 8-2 MG FILM Place 1 Film under the tongue 3 (three) times daily. 08/07/16    [provider]  sulfamethoxazole-trimethoprim (BACTRIM DS,SEPTRA DS) 800-160 MG tablet Take 1 tablet by mouth 2 (two) times daily. 03/29/17 04/05/17  Hayden RasmussenMabe, David, NP    Family History Family History  Problem Relation Age of Onset  . Cancer Maternal Grandfather        prostate  . Hypertension Mother   . Diabetes Mother   . Neuropathy Mother     Social History Social History  Substance Use Topics  . Smoking status: Current Every Day Smoker    Packs/day: 0.50    Years: 10.00    Types: Cigarettes  . Smokeless tobacco: Never Used  . Alcohol use No     Comment: last drink was 1 yr ago     Allergies   Patient has no known allergies.   Review of Systems Review of Systems  Constitutional: Positive for chills and fever.  Musculoskeletal: Positive for myalgias.  Skin: Positive for color change and wound.  All other systems reviewed and are negative.    Physical Exam Updated Vital Signs BP 118/66   Pulse 87   Temp 97.7 F (36.5 C) (Oral)   Resp 18   Ht 5\' 2"  (1.575 m)   Wt 77.1 kg   LMP 03/20/2017 (Approximate)   SpO2 98%   BMI 31.09 kg/m   Physical Exam  Constitutional: She is oriented to person, place, and time. She appears well-developed and well-nourished. No distress.  HENT:  Head: Normocephalic and atraumatic.  Cardiovascular: Normal rate, regular rhythm and normal heart sounds.   No murmur heard. Pulmonary/Chest: Effort normal and breath sounds normal. No respiratory distress.  Abdominal: Soft. She exhibits no distension. There is no tenderness.  Musculoskeletal:  LUE with full ROM. 2+ radial pulse. Sensation intact.  Neurological: She is alert and oriented to person, place, and time.  Skin:  See image below: erythema very tender and warm to the touch.  Nursing note and vitals reviewed.          ED Treatments / Results  Labs (all labs ordered are listed, but only abnormal results are displayed) Labs Reviewed  COMPREHENSIVE METABOLIC  PANEL - Abnormal; Notable for the following:       Result Value   Sodium 131 (*)    Chloride 98 (*)    Glucose, Bld 107 (*)    Creatinine, Ser 1.13 (*)    Calcium 8.6 (*)    Total Protein 8.7 (*)    ALT 68 (*)    Alkaline Phosphatase 144 (*)    All other components within normal limits  CBC WITH DIFFERENTIAL/PLATELET - Abnormal; Notable for the following:    WBC 13.3 (*)    Neutro Abs 9.8 (*)    Monocytes Absolute 1.1 (*)    All other components within normal limits  I-STAT  CG4 LACTIC ACID, ED  I-STAT CG4 LACTIC ACID, ED    EKG  EKG Interpretation None       Radiology No results found.  Procedures Procedures (including critical care time)  Medications Ordered in ED Medications  sodium chloride 0.9 % bolus 1,000 mL (not administered)  morphine 4 MG/ML injection 4 mg (not administered)  vancomycin (VANCOCIN) IVPB 1000 mg/200 mL premix (not administered)     Initial Impression / Assessment and Plan / ED Course  I have reviewed the triage vital signs and the nursing notes.  Pertinent labs & imaging results that were available during my care of the patient were reviewed by me and considered in my medical decision making (see chart for details).    Patty Valdez is a 31 y.o. female who presents to ED for abscess. Seen at urgent care two days ago where area was I&D and she was started on Bactrim. Took Bactrim BID yesterday and today, however abscess has continued to drain purulent discharge and she now has developed extensive cellulitis up the arm. Afebrile here, but significant other notes temp of 101 at home yesterday. White count elevated at 13.3. Lactic wdl. Wound culture performed at urgent care two days ago. Started on IV vanc. Hospitalist consulted who will admit.   Patient seen by and discussed with Dr. Estell Harpin who agrees with treatment plan.   Final Clinical Impressions(s) / ED Diagnoses   Final diagnoses:  Abscess  Cellulitis of left upper extremity     New Prescriptions New Prescriptions   No medications on file     Ward, Chase Picket, Cordelia Poche 03/31/17 2125    Bethann Berkshire, MD 04/01/17 1530

## 2017-03-31 NOTE — ED Notes (Signed)
Report attempted 

## 2017-04-01 DIAGNOSIS — R748 Abnormal levels of other serum enzymes: Secondary | ICD-10-CM | POA: Diagnosis present

## 2017-04-01 DIAGNOSIS — K219 Gastro-esophageal reflux disease without esophagitis: Secondary | ICD-10-CM | POA: Diagnosis present

## 2017-04-01 LAB — CBC
HCT: 31.4 % — ABNORMAL LOW (ref 36.0–46.0)
HEMOGLOBIN: 10.5 g/dL — AB (ref 12.0–15.0)
MCH: 30.1 pg (ref 26.0–34.0)
MCHC: 33.4 g/dL (ref 30.0–36.0)
MCV: 90 fL (ref 78.0–100.0)
Platelets: 178 10*3/uL (ref 150–400)
RBC: 3.49 MIL/uL — AB (ref 3.87–5.11)
RDW: 13 % (ref 11.5–15.5)
WBC: 7.8 10*3/uL (ref 4.0–10.5)

## 2017-04-01 LAB — COMPREHENSIVE METABOLIC PANEL
ALK PHOS: 156 U/L — AB (ref 38–126)
ALT: 69 U/L — AB (ref 14–54)
ANION GAP: 11 (ref 5–15)
AST: 44 U/L — ABNORMAL HIGH (ref 15–41)
Albumin: 3.1 g/dL — ABNORMAL LOW (ref 3.5–5.0)
BUN: 9 mg/dL (ref 6–20)
CALCIUM: 7.9 mg/dL — AB (ref 8.9–10.3)
CO2: 21 mmol/L — AB (ref 22–32)
CREATININE: 0.87 mg/dL (ref 0.44–1.00)
Chloride: 102 mmol/L (ref 101–111)
GFR calc Af Amer: >60 mL/min (ref >60)
GFR calc non Af Amer: >60 mL/min (ref >60)
GLUCOSE: 109 mg/dL — AB (ref 65–99)
Potassium: 3.3 mmol/L — ABNORMAL LOW (ref 3.5–5.1)
SODIUM: 134 mmol/L — AB (ref 135–145)
Total Bilirubin: 0.5 mg/dL (ref 0.3–1.2)
Total Protein: 7 g/dL (ref 6.5–8.1)

## 2017-04-01 LAB — RAPID URINE DRUG SCREEN, HOSP PERFORMED
Amphetamines: NOT DETECTED
BARBITURATES: NOT DETECTED
BENZODIAZEPINES: POSITIVE — AB
Cocaine: NOT DETECTED
Opiates: POSITIVE — AB
TETRAHYDROCANNABINOL: POSITIVE — AB

## 2017-04-01 LAB — AEROBIC CULTURE  (SUPERFICIAL SPECIMEN)

## 2017-04-01 LAB — SURGICAL PCR SCREEN
MRSA, PCR: NEGATIVE
STAPHYLOCOCCUS AUREUS: NEGATIVE

## 2017-04-01 LAB — AEROBIC CULTURE W GRAM STAIN (SUPERFICIAL SPECIMEN)

## 2017-04-01 LAB — HIV ANTIBODY (ROUTINE TESTING W REFLEX): HIV SCREEN 4TH GENERATION: NONREACTIVE

## 2017-04-01 LAB — PREGNANCY, URINE: PREG TEST UR: NEGATIVE

## 2017-04-01 MED ORDER — POTASSIUM CHLORIDE CRYS ER 20 MEQ PO TBCR
40.0000 meq | EXTENDED_RELEASE_TABLET | Freq: Every day | ORAL | Status: DC
  Administered 2017-04-01: 40 meq via ORAL
  Filled 2017-04-01: qty 2

## 2017-04-01 MED ORDER — VANCOMYCIN HCL IN DEXTROSE 750-5 MG/150ML-% IV SOLN
750.0000 mg | Freq: Three times a day (TID) | INTRAVENOUS | Status: DC
  Administered 2017-04-01 – 2017-04-03 (×6): 750 mg via INTRAVENOUS
  Filled 2017-04-01 (×6): qty 150

## 2017-04-01 MED ORDER — IBUPROFEN 400 MG PO TABS
400.0000 mg | ORAL_TABLET | Freq: Four times a day (QID) | ORAL | Status: DC | PRN
  Administered 2017-04-01 – 2017-04-05 (×5): 400 mg via ORAL
  Filled 2017-04-01 (×5): qty 1

## 2017-04-01 MED ORDER — MIDAZOLAM HCL 2 MG/2ML IJ SOLN
INTRAMUSCULAR | Status: AC
  Filled 2017-04-01: qty 2

## 2017-04-01 MED ORDER — FENTANYL CITRATE (PF) 250 MCG/5ML IJ SOLN
INTRAMUSCULAR | Status: AC
  Filled 2017-04-01: qty 5

## 2017-04-01 MED ORDER — PROPOFOL 10 MG/ML IV BOLUS
INTRAVENOUS | Status: AC
  Filled 2017-04-01: qty 20

## 2017-04-01 NOTE — Progress Notes (Signed)
Patient was very upset that the Hand MD had not come and see her yet and felt like that since she hadn't been seen that no surgery was going to happen tonight. I called MD Ronalee BeltsBhandari and notified him she had not been seen and asked if we could feed her since it was looking like she wasn't going to get surgery. He said at this point just let her eat and put in a Regular Diet and make NPO after midnight.

## 2017-04-01 NOTE — Progress Notes (Signed)
Patient requesting heating pad for her arm. MD paged.

## 2017-04-01 NOTE — Progress Notes (Addendum)
I called Dr. Debby Budoley's office to find out the surgery's plan. Pt was NPO, awaiting surgery consult.  Patient wanting to eat.

## 2017-04-01 NOTE — Consult Note (Signed)
Reason for Consult:infection L arm Referring Physician: ER  CC:My arm hurts, there is a big hole  HPI:  Patty Valdez is an 31 y.o. right handed female who presents with abscess, cellulitis of left arm.  States small area appeared last week, became worse, went to urgent care where I&D was performed, arm still worsened, presented back to ER, admitted, placed on antibiotics.  Pt scheduled for I&D this evening, however pt ate!     .   Pain is rated at  8  /10 and is described as sharp.  Pain is constant.  Pain is made better by rest/immobilization, worse with motion.   Associated signs/symptoms: Previous treatment:    Past Medical History:  Diagnosis Date  . Abdominal pain   . Anemia 10 yrs ago  . Anxiety   . Complication of anesthesia 04-08-2009   low blood pressure after epidural, had to take iv meds for  . Gallstones   . Genital herpes    no current meds for  . Migraine   . Nausea   . Obesity   . Vaginal bleeding     Past Surgical History:  Procedure Laterality Date  . CESAREAN SECTION N/A 05/15/2015   Procedure: CESAREAN SECTION;  Surgeon: Frederico Hamman, MD;  Location: [facility];  Service: Obstetrics;  Laterality: N/A;  . CHOLECYSTECTOMY    . NO PAST SURGERIES      Family History  Problem Relation Age of Onset  . Cancer Maternal Grandfather        prostate  . Hypertension Mother   . Diabetes Mother   . Neuropathy Mother     Social History:  reports that she has been smoking Cigarettes.  She has a 5.00 pack-year smoking history. She has never used smokeless tobacco. She reports that she uses drugs, including Marijuana, about 2 times per week. She reports that she does not drink alcohol.  Allergies: No Known Allergies  Medications: I have reviewed the patient's current medications.  Results for orders placed or performed during the hospital encounter of 03/31/17 (from the past 48 hour(s))  Comprehensive metabolic panel     Status: Abnormal   Collection Time:  03/31/17  6:35 PM  Result Value Ref Range   Sodium 131 (L) 135 - 145 mmol/L   Potassium 3.6 3.5 - 5.1 mmol/L   Chloride 98 (L) 101 - 111 mmol/L   CO2 23 22 - 32 mmol/L   Glucose, Bld 107 (H) 65 - 99 mg/dL   BUN 9 6 - 20 mg/dL   Creatinine, Ser 1.13 (H) 0.44 - 1.00 mg/dL   Calcium 8.6 (L) 8.9 - 10.3 mg/dL   Total Protein 8.7 (H) 6.5 - 8.1 g/dL   Albumin 3.7 3.5 - 5.0 g/dL   AST 29 15 - 41 U/L   ALT 68 (H) 14 - 54 U/L   Alkaline Phosphatase 144 (H) 38 - 126 U/L   Total Bilirubin 0.7 0.3 - 1.2 mg/dL   GFR calc non Af Amer >60 >60 mL/min   GFR calc Af Amer >60 >60 mL/min    Comment: (NOTE) The eGFR has been calculated using the CKD EPI equation. This calculation has not been validated in all clinical situations. eGFR's persistently <60 mL/min signify possible Chronic Kidney Disease.    Anion gap 10 5 - 15  CBC with Differential     Status: Abnormal   Collection Time: 03/31/17  6:35 PM  Result Value Ref Range   WBC 13.3 (H) 4.0 -  10.5 K/uL   RBC 4.03 3.87 - 5.11 MIL/uL   Hemoglobin 12.3 12.0 - 15.0 g/dL   HCT 36.4 36.0 - 46.0 %   MCV 90.3 78.0 - 100.0 fL   MCH 30.5 26.0 - 34.0 pg   MCHC 33.8 30.0 - 36.0 g/dL   RDW 12.8 11.5 - 15.5 %   Platelets 239 150 - 400 K/uL   Neutrophils Relative % 74 %   Neutro Abs 9.8 (H) 1.7 - 7.7 K/uL   Lymphocytes Relative 18 %   Lymphs Abs 2.4 0.7 - 4.0 K/uL   Monocytes Relative 8 %   Monocytes Absolute 1.1 (H) 0.1 - 1.0 K/uL   Eosinophils Relative 0 %   Eosinophils Absolute 0.0 0.0 - 0.7 K/uL   Basophils Relative 0 %   Basophils Absolute 0.0 0.0 - 0.1 K/uL  I-Stat CG4 Lactic Acid, ED     Status: None   Collection Time: 03/31/17  7:02 PM  Result Value Ref Range   Lactic Acid, Venous 1.21 0.5 - 1.9 mmol/L  HIV antibody (Routine Testing)     Status: None   Collection Time: 04/01/17  5:01 AM  Result Value Ref Range   HIV Screen 4th Generation wRfx Non Reactive Non Reactive    Comment: (NOTE) Performed At: [facility] [place], [place] [id number] Lindon Romp MD QV:[id number]   CBC     Status: Abnormal   Collection Time: 04/01/17  5:01 AM  Result Value Ref Range   WBC 7.8 4.0 - 10.5 K/uL   RBC 3.49 (L) 3.87 - 5.11 MIL/uL   Hemoglobin 10.5 (L) 12.0 - 15.0 g/dL   HCT 31.4 (L) 36.0 - 46.0 %   MCV 90.0 78.0 - 100.0 fL   MCH 30.1 26.0 - 34.0 pg   MCHC 33.4 30.0 - 36.0 g/dL   RDW 13.0 11.5 - 15.5 %   Platelets 178 150 - 400 K/uL  Comprehensive metabolic panel     Status: Abnormal   Collection Time: 04/01/17  5:01 AM  Result Value Ref Range   Sodium 134 (L) 135 - 145 mmol/L   Potassium 3.3 (L) 3.5 - 5.1 mmol/L   Chloride 102 101 - 111 mmol/L   CO2 21 (L) 22 - 32 mmol/L   Glucose, Bld 109 (H) 65 - 99 mg/dL   BUN 9 6 - 20 mg/dL   Creatinine, Ser 0.87 0.44 - 1.00 mg/dL   Calcium 7.9 (L) 8.9 - 10.3 mg/dL   Total Protein 7.0 6.5 - 8.1 g/dL   Albumin 3.1 (L) 3.5 - 5.0 g/dL   AST 44 (H) 15 - 41 U/L   ALT 69 (H) 14 - 54 U/L   Alkaline Phosphatase 156 (H) 38 - 126 U/L   Total Bilirubin 0.5 0.3 - 1.2 mg/dL   GFR calc non Af Amer >60 >60 mL/min   GFR calc Af Amer >60 >60 mL/min    Comment: (NOTE) The eGFR has been calculated using the CKD EPI equation. This calculation has not been validated in all clinical situations. eGFR's persistently <60 mL/min signify possible Chronic Kidney Disease.    Anion gap 11 5 - 15  Urine rapid drug screen (hosp performed)     Status: Abnormal   Collection Time: 04/01/17 12:51 PM  Result Value Ref Range   Opiates POSITIVE (A) NONE DETECTED   Cocaine NONE DETECTED NONE DETECTED   Benzodiazepines POSITIVE (A) NONE DETECTED   Amphetamines NONE DETECTED NONE DETECTED   Tetrahydrocannabinol  POSITIVE (A) NONE DETECTED   Barbiturates NONE DETECTED NONE DETECTED    Comment:        DRUG SCREEN FOR MEDICAL PURPOSES ONLY.  IF CONFIRMATION IS NEEDED FOR ANY PURPOSE, NOTIFY LAB WITHIN 5 DAYS.        LOWEST DETECTABLE LIMITS FOR URINE DRUG SCREEN Drug Class        Cutoff (ng/mL) Amphetamine      1000 Barbiturate      200 Benzodiazepine   223 Tricyclics       361 Opiates          300 Cocaine          300 THC              50   Pregnancy, urine     Status: None   Collection Time: 04/01/17 12:51 PM  Result Value Ref Range   Preg Test, Ur NEGATIVE NEGATIVE    Comment:        THE SENSITIVITY OF THIS METHODOLOGY IS >20 mIU/mL.     [place] Lt Upper Extrem Ltd Soft Tissue Non Vascular  Result Date: 03/31/2017 CLINICAL DATA:  Abscess EXAM: ULTRASOUND LEFT UPPER EXTREMITY LIMITED TECHNIQUE: Ultrasound examination of the upper extremity (left forearm) soft tissues was performed in the area of clinical concern. COMPARISON:  None FINDINGS: A 0.8 x 0.7 x 0.8 cm complex hypoechoic ill-defined and ill marginated fluid collection is seen within the subcutaneous soft tissues of the left forearm consistent with tiny abscess. Surrounding cellulitic change is noted. No vascularity is identified. IMPRESSION: Ill-defined subcutaneous soft tissue fluid collection in the area of interest involving the left forearm consistent with a tiny abscess measuring 0.8 x 0.7 x 0.8 cm. Adjacent tracking soft tissue edema consistent with cellulitis is noted. Electronically Signed   By: Ashley Royalty M.D.   On: 03/31/2017 23:26    Pertinent items are noted in HPI. Temp:  [97.7 F (36.5 C)-98 F (36.7 C)] 98 F (36.7 C) (05/14 1440) Pulse Rate:  [69-78] 74 (05/14 1440) Resp:  [18] 18 (05/14 1440) BP: (105-110)/(58-64) 110/58 (05/14 1440) SpO2:  [99 %-100 %] 99 % (05/14 1440) General appearance: alert and cooperative Resp: clear to auscultation bilaterally Cardio: regular rate and rhythm GI: soft, non-tender; bowel sounds normal; no masses,  no organomegaly Extremities: extremities normal, atraumatic, no cyanosis or edema except for LUE:  L antecubital area with 1cm opening, draining some fluid, significant erythema to lateral arm and 1 spot medial arm Assessment: Abscess,  cellulitis Plan: Wound needs exploration, drainage, ? Additional infection; cont abx I have discussed this treatment plan in detail with patient , including the risks of the recommended treatment or surgery, the benefits and the alternatives.  The patient  understands that additional treatment may be necessary.  Lynell Greenhouse C Amritha Yorke 04/01/2017, 8:45 PM

## 2017-04-01 NOTE — Progress Notes (Signed)
Pt asking to go smoke around 0400. Told her she could not go smoke. Offered to get order for nicotine patch,but she refused.

## 2017-04-01 NOTE — Progress Notes (Addendum)
PROGRESS NOTE    Patty Valdez  ZOX:[id number] DOB: Jul 19, 1986 DOA: 03/31/2017 PCP: Patient, No Pcp Per   Brief Narrative: 31 year old female with remote history of IV drug abuse, hep C, depression, presented with worsening left arm pain and swelling and redness. Patient was seen in the emergency 2 days prior to admission for the diagnosis of abscess status post I and D, drain placed and discharge with oral Bactrim. Patient's hand redness and swelling worsened and therefore came to the hospital for further evaluation. Patient reported temperature of 101 at home.  Assessment & Plan:  #  Cellulitis and abscess of left upper extremity: -Patient with recent I and D at ER. The wound culture growing strep pyogenes and gram-positive cocci in cluster. Follow up final culture results. Continue IV vancomycin. - I consulted general surgery early morning today for the consult. Later, I received a message recommending to consult Dr. Izora Ribas from hand surgery. Spoke with Dr. Izora Ribas for the consult. ( I spoke with the office secretary for the consult twice and left patient's information and my contact no twice today).  He recommended to keep patient nothing by mouth for possible procedure today. -Leukocytosis improving.  #History of tobacco abuse: Patient declined nicotine patch.  #Anxiety depression: Mood is stable.  #History of hepatitis C: Recommend outpatient follow-up.  #Mild hypokalemia: Replete potassium. Monitor labs.  Active Problems:   Anxiety state   Cellulitis of left arm   Abscess of left arm   GERD (gastroesophageal reflux disease)   Elevated liver enzymes  DVT prophylaxis: Lovenox subcutaneous Code Status:. Full Code Family Communication: Patient's family at bedside Disposition Plan: Likely discharge home in 1-2 days depending on surgeon's plan    Consultants:   General surgery  Procedures: None Antimicrobials: IV vancomycin since May 13  Subjective: Reported left upper  extremity pain controlled with the current medication. She wanted to smoke and go outside. No fever, chills, nausea, vomiting, chest pain or shortness of breath.  Objective: Vitals:   03/31/17 1807 03/31/17 2245 04/01/17 0547  BP: 118/66 106/64 105/61  Pulse: 87 78 69  Resp: 18 18 18   Temp: 97.7 F (36.5 C) 98 F (36.7 C) 97.7 F (36.5 C)  TempSrc: Oral Oral Oral  SpO2: 98% 100% 100%  Weight: 77.1 kg (170 lb)    Height: 5\' 2"  (1.575 m)      Intake/Output Summary (Last 24 hours) at 04/01/17 1227 Last data filed at 04/01/17 0931  Gross per 24 hour  Intake           981.67 ml  Output             1000 ml  Net           -18.33 ml   Filed Weights   03/31/17 1807  Weight: 77.1 kg (170 lb)    Examination:  General exam: Appears calm and comfortable  Respiratory system: Clear to auscultation. Respiratory effort normal. No wheezing or crackle Cardiovascular system: S1 & S2 heard, RRR.  No pedal edema. Gastrointestinal system: Abdomen is nondistended, soft and nontender. Normal bowel sounds heard. Central nervous system: Alert and oriented. No focal neurological deficits. Extremities: Left upper extremity with redness and dressing applied, warm to touch Skin: Erythematous changes of left forearm Psychiatry: Judgement and insight appear normal. Mood & affect appropriate.     Data Reviewed: I have personally reviewed following labs and imaging studies  CBC:  Recent Labs Lab 03/31/17 1835 04/01/17 0501  WBC 13.3* 7.8  NEUTROABS  9.8*  --   HGB 12.3 10.5*  HCT 36.4 31.4*  MCV 90.3 90.0  PLT 239 178   Basic Metabolic Panel:  Recent Labs Lab 03/31/17 1835 04/01/17 0501  NA 131* 134*  K 3.6 3.3*  CL 98* 102  CO2 23 21*  GLUCOSE 107* 109*  BUN 9 9  CREATININE 1.13* 0.87  CALCIUM 8.6* 7.9*   GFR: Estimated Creatinine Clearance: 90.1 mL/min (by C-G formula based on SCr of 0.87 mg/dL). Liver Function Tests:  Recent Labs Lab 03/31/17 1835 04/01/17 0501  AST 29  44*  ALT 68* 69*  ALKPHOS 144* 156*  BILITOT 0.7 0.5  PROT 8.7* 7.0  ALBUMIN 3.7 3.1*   No results for input(s): LIPASE, AMYLASE in the last 168 hours. No results for input(s): AMMONIA in the last 168 hours. Coagulation Profile: No results for input(s): INR, PROTIME in the last 168 hours. Cardiac Enzymes: No results for input(s): CKTOTAL, CKMB, CKMBINDEX, TROPONINI in the last 168 hours. BNP (last 3 results) No results for input(s): PROBNP in the last 8760 hours. HbA1C: No results for input(s): HGBA1C in the last 72 hours. CBG: No results for input(s): GLUCAP in the last 168 hours. Lipid Profile: No results for input(s): CHOL, HDL, LDLCALC, TRIG, CHOLHDL, LDLDIRECT in the last 72 hours. Thyroid Function Tests: No results for input(s): TSH, T4TOTAL, FREET4, T3FREE, THYROIDAB in the last 72 hours. Anemia Panel: No results for input(s): VITAMINB12, FOLATE, FERRITIN, TIBC, IRON, RETICCTPCT in the last 72 hours. Sepsis Labs:  Recent Labs Lab 03/31/17 1902  LATICACIDVEN 1.21    Recent Results (from the past 240 hour(s))  Wound or Superficial Culture     Status: None (Preliminary result)   Collection Time: 03/29/17  7:31 PM  Result Value Ref Range Status   Specimen Description WOUND LEFT FOREARM  Final   Special Requests NONE  Final   Gram Stain   Final    ABUNDANT WBC PRESENT, PREDOMINANTLY PMN ABUNDANT GRAM POSITIVE COCCI IN CLUSTERS    Culture FEW GROUP A STREP (S.PYOGENES) ISOLATED  Final   Report Status PENDING  Incomplete         Radiology Studies: [place] Lt Upper Extrem Ltd Soft Tissue Non Vascular  Result Date: 03/31/2017 CLINICAL DATA:  Abscess EXAM: ULTRASOUND LEFT UPPER EXTREMITY LIMITED TECHNIQUE: Ultrasound examination of the upper extremity (left forearm) soft tissues was performed in the area of clinical concern. COMPARISON:  None FINDINGS: A 0.8 x 0.7 x 0.8 cm complex hypoechoic ill-defined and ill marginated fluid collection is seen within the subcutaneous  soft tissues of the left forearm consistent with tiny abscess. Surrounding cellulitic change is noted. No vascularity is identified. IMPRESSION: Ill-defined subcutaneous soft tissue fluid collection in the area of interest involving the left forearm consistent with a tiny abscess measuring 0.8 x 0.7 x 0.8 cm. Adjacent tracking soft tissue edema consistent with cellulitis is noted. Electronically Signed   By: Tollie Ethavid  Kwon M.D.   On: 03/31/2017 23:26        Scheduled Meds: . enoxaparin (LOVENOX) injection  40 mg Subcutaneous Q24H  . potassium chloride  40 mEq Oral Daily   Continuous Infusions: . sodium chloride 100 mL/hr at 04/01/17 0714  . vancomycin Stopped (04/01/17 1128)     LOS: 1 day    Lurine Imel Jaynie CollinsPrasad Yaeli Hartung, MD [facility] Pager [phone]  If 7PM-7AM, please contact night-coverage [email or web address] Password [email or web address] 04/01/2017, 12:27 PM

## 2017-04-01 NOTE — Care Management Note (Signed)
Case Management Note  Patient Details  Name: Patty Valdez MRN: [id number] Date of Birth: 07-Feb-1986  Subjective/Objective:                    Action/Plan: h/o IVDA, hep C, depression, suicide attempt by drug overdose;  I and D in ED 2 days PTA started on Bactrim , but got worst    Expected Discharge Date:  04/05/17               Expected Discharge Plan:     In-House Referral:     Discharge planning Services     Post Acute Care Choice:    Choice offered to:     DME Arranged:    DME Agency:     HH Arranged:    HH Agency:     Status of Service:  In process, will continue to follow  If discussed at Long Length of Stay Meetings, dates discussed:    Additional Comments:  [name], [name], RN 04/01/2017, 11:38 AM

## 2017-04-01 NOTE — Progress Notes (Signed)
Pharmacy Antibiotic Note  Patty Valdez is a 31 y.o. female admitted on 03/31/2017 with cellulitis.  Pharmacy has been consulted for vancomycin dosing.  Patient had I&D of left arm done at urgent care on 03/29/17.  She has increased redness (extending up to humerus area) and inability to move her arm.  Patient's renal function is improving.   Plan: - Change vanc to 750mg  IV Q8H for goal trough ~2015mcg/mL - Monitor renal fxn, micro data - Vanc trough soon if still on therapy (?discharging in 1-2 days)   Height: 5\' 2"  (157.5 cm) Weight: 170 lb (77.1 kg) IBW/kg (Calculated) : 50.1  Temp (24hrs), Avg:97.8 F (36.6 C), Min:97.7 F (36.5 C), Max:98 F (36.7 C)   Recent Labs Lab 03/31/17 1835 03/31/17 1902 04/01/17 0501  WBC 13.3*  --  7.8  CREATININE 1.13*  --  0.87  LATICACIDVEN  --  1.21  --     Estimated Creatinine Clearance: 90.1 mL/min (by C-G formula based on SCr of 0.87 mg/dL).    No Known Allergies   Vanc 5/13 >>   5/13 BCx -     Tao Satz D. Laney Potashang, PharmD, BCPS Pager:  [phone] 04/01/2017, 1:12 PM

## 2017-04-02 ENCOUNTER — Encounter (HOSPITAL_COMMUNITY): Payer: Self-pay | Admitting: Certified Registered Nurse Anesthetist

## 2017-04-02 ENCOUNTER — Encounter (HOSPITAL_COMMUNITY)
Admission: EM | Disposition: A | Discharge status: Home / Self Care | Payer: Self-pay | Source: Home / Self Care | Attending: Family Medicine

## 2017-04-02 ENCOUNTER — Inpatient Hospital Stay (HOSPITAL_COMMUNITY): Payer: Medicaid Other | Admitting: Certified Registered Nurse Anesthetist

## 2017-04-02 DIAGNOSIS — E876 Hypokalemia: Secondary | ICD-10-CM

## 2017-04-02 DIAGNOSIS — R52 Pain, unspecified: Secondary | ICD-10-CM

## 2017-04-02 HISTORY — PX: I & D EXTREMITY: SHX5045

## 2017-04-02 LAB — BASIC METABOLIC PANEL
Anion gap: 5 (ref 5–15)
BUN: 5 mg/dL — ABNORMAL LOW (ref 6–20)
CHLORIDE: 109 mmol/L (ref 101–111)
CO2: 21 mmol/L — AB (ref 22–32)
CREATININE: 0.69 mg/dL (ref 0.44–1.00)
Calcium: 8.1 mg/dL — ABNORMAL LOW (ref 8.9–10.3)
GFR calc non Af Amer: >60 mL/min (ref >60)
GLUCOSE: 112 mg/dL — AB (ref 65–99)
Potassium: 4 mmol/L (ref 3.5–5.1)
Sodium: 135 mmol/L (ref 135–145)

## 2017-04-02 SURGERY — IRRIGATION AND DEBRIDEMENT EXTREMITY
Anesthesia: General | Laterality: Left

## 2017-04-02 MED ORDER — SODIUM CHLORIDE 0.9 % IR SOLN
Status: DC | PRN
  Administered 2017-04-02: 1000 mL

## 2017-04-02 MED ORDER — HYDROMORPHONE HCL 1 MG/ML IJ SOLN
INTRAMUSCULAR | Status: AC
  Administered 2017-04-02: 0.5 mg via INTRAVENOUS
  Filled 2017-04-02: qty 2

## 2017-04-02 MED ORDER — LACTATED RINGERS IV SOLN
INTRAVENOUS | Status: DC
  Administered 2017-04-02 (×2): via INTRAVENOUS

## 2017-04-02 MED ORDER — MIDAZOLAM HCL 2 MG/2ML IJ SOLN
INTRAMUSCULAR | Status: AC
  Filled 2017-04-02: qty 2

## 2017-04-02 MED ORDER — OXYCODONE HCL 5 MG PO TABS: 5.0000 mg | ORAL_TABLET | Freq: Once | ORAL | Status: DC | PRN

## 2017-04-02 MED ORDER — PROPOFOL 10 MG/ML IV BOLUS
INTRAVENOUS | Status: DC | PRN
  Administered 2017-04-02: 200 mg via INTRAVENOUS

## 2017-04-02 MED ORDER — MIDAZOLAM HCL 5 MG/5ML IJ SOLN
INTRAMUSCULAR | Status: DC | PRN
  Administered 2017-04-02: 2 mg via INTRAVENOUS

## 2017-04-02 MED ORDER — ONDANSETRON HCL 4 MG/2ML IJ SOLN
INTRAMUSCULAR | Status: DC | PRN
  Administered 2017-04-02: 4 mg via INTRAVENOUS

## 2017-04-02 MED ORDER — FENTANYL CITRATE (PF) 100 MCG/2ML IJ SOLN
INTRAMUSCULAR | Status: DC | PRN
  Administered 2017-04-02 (×5): 50 ug via INTRAVENOUS

## 2017-04-02 MED ORDER — FENTANYL CITRATE (PF) 250 MCG/5ML IJ SOLN
INTRAMUSCULAR | Status: AC
  Filled 2017-04-02: qty 5

## 2017-04-02 MED ORDER — ONDANSETRON HCL 4 MG/2ML IJ SOLN
INTRAMUSCULAR | Status: AC
  Filled 2017-04-02: qty 2

## 2017-04-02 MED ORDER — BUPIVACAINE HCL (PF) 0.25 % IJ SOLN
INTRAMUSCULAR | Status: AC
  Filled 2017-04-02: qty 30

## 2017-04-02 MED ORDER — OXYCODONE HCL 5 MG/5ML PO SOLN: 5.0000 mg | Freq: Once | ORAL | Status: DC | PRN

## 2017-04-02 MED ORDER — BUPIVACAINE HCL (PF) 0.25 % IJ SOLN
INTRAMUSCULAR | Status: DC | PRN
  Administered 2017-04-02: 30 mL

## 2017-04-02 MED ORDER — LIDOCAINE HCL (CARDIAC) 20 MG/ML IV SOLN
INTRAVENOUS | Status: DC | PRN
  Administered 2017-04-02: 60 mg via INTRAVENOUS

## 2017-04-02 MED ORDER — HYDROMORPHONE HCL 1 MG/ML IJ SOLN
0.2500 mg | INTRAMUSCULAR | Status: DC | PRN
  Administered 2017-04-02 (×4): 0.5 mg via INTRAVENOUS

## 2017-04-02 MED ORDER — LIDOCAINE 2% (20 MG/ML) 5 ML SYRINGE
INTRAMUSCULAR | Status: AC
  Filled 2017-04-02: qty 5

## 2017-04-02 SURGICAL SUPPLY — 30 items
BAG DECANTER FOR FLEXI CONT (MISCELLANEOUS) ×3 IMPLANT
BANDAGE ELASTIC 4 VELCRO ST LF (GAUZE/BANDAGES/DRESSINGS) ×2 IMPLANT
BNDG GAUZE ELAST 4 BULKY (GAUZE/BANDAGES/DRESSINGS) ×2 IMPLANT
CORDS BIPOLAR (ELECTRODE) ×2 IMPLANT
DRAPE SURG 17X23 STRL (DRAPES) ×3 IMPLANT
ELECT REM PT RETURN 9FT ADLT (ELECTROSURGICAL) ×3
ELECTRODE REM PT RTRN 9FT ADLT (ELECTROSURGICAL) IMPLANT
GAUZE SPONGE 4X4 12PLY STRL (GAUZE/BANDAGES/DRESSINGS) ×3 IMPLANT
GAUZE SPONGE 4X4 12PLY STRL LF (GAUZE/BANDAGES/DRESSINGS) ×2 IMPLANT
GLOVE BIOGEL M 8.0 STRL (GLOVE) ×3 IMPLANT
GOWN STRL REUS W/ TWL LRG LVL3 (GOWN DISPOSABLE) ×2 IMPLANT
GOWN STRL REUS W/TWL LRG LVL3 (GOWN DISPOSABLE) ×6
KIT BASIN OR (CUSTOM PROCEDURE TRAY) ×3 IMPLANT
KIT ROOM TURNOVER OR (KITS) ×3 IMPLANT
NDL HYPO 25GX1X1/2 BEV (NEEDLE) IMPLANT
NEEDLE HYPO 25GX1X1/2 BEV (NEEDLE) ×3 IMPLANT
NS IRRIG 1000ML POUR BTL (IV SOLUTION) ×3 IMPLANT
PACK ORTHO EXTREMITY (CUSTOM PROCEDURE TRAY) ×3 IMPLANT
PAD ARMBOARD 7.5X6 YLW CONV (MISCELLANEOUS) ×6 IMPLANT
SOAP 2 % CHG 4 OZ (WOUND CARE) ×3 IMPLANT
SPONGE LAP 18X18 X RAY DECT (DISPOSABLE) ×2 IMPLANT
SPONGE LAP 4X18 X RAY DECT (DISPOSABLE) ×3 IMPLANT
SWAB CULTURE ESWAB REG 1ML (MISCELLANEOUS) ×2 IMPLANT
SYR CONTROL 10ML LL (SYRINGE) ×2 IMPLANT
TOWEL OR 17X24 6PK STRL BLUE (TOWEL DISPOSABLE) ×3 IMPLANT
TOWEL OR 17X26 10 PK STRL BLUE (TOWEL DISPOSABLE) ×3 IMPLANT
TUBE CONNECTING 12'X1/4 (SUCTIONS) ×1
TUBE CONNECTING 12X1/4 (SUCTIONS) ×2 IMPLANT
WATER STERILE IRR 1000ML POUR (IV SOLUTION) ×3 IMPLANT
YANKAUER SUCT BULB TIP NO VENT (SUCTIONS) ×3 IMPLANT

## 2017-04-02 NOTE — Anesthesia Postprocedure Evaluation (Signed)
Anesthesia Post Note  [name]  Procedure(s) Performed: Procedure(s) (LRB): IRRIGATION AND DEBRIDEMENT EXTREMITY (Left)  Patient location during evaluation: PACU Anesthesia Type: General Level of consciousness: awake and alert Pain management: pain level controlled Vital Signs Assessment: post-procedure vital signs reviewed and stable Respiratory status: spontaneous breathing, nonlabored ventilation and respiratory function stable Cardiovascular status: blood pressure returned to baseline and stable Postop Assessment: no signs of nausea or vomiting Anesthetic complications: no       Last Vitals:  Vitals:   04/02/17 1413 04/02/17 1420  BP: 112/77 115/71  Pulse: 82 79  Resp: 12   Temp:  36.7 C    Last Pain:  Vitals:   04/02/17 1420  TempSrc: Oral  PainSc: 8                  Newell Wafer,W. EDMOND

## 2017-04-02 NOTE — Transfer of Care (Signed)
Immediate Anesthesia Transfer of Care Note  [name]  Procedure(s) Performed: Procedure(s): IRRIGATION AND DEBRIDEMENT EXTREMITY (Left)  Patient Location: PACU  Anesthesia Type:General  Level of Consciousness: patient cooperative and responds to stimulation  Airway & Oxygen Therapy: Patient Spontanous Breathing  Post-op Assessment: Report given to RN and Post -op Vital signs reviewed and stable  Post vital signs: Reviewed and stable  Last Vitals:  Vitals:   04/02/17 1408 04/02/17 1413  BP: 115/74 112/77  Pulse: 82 82  Resp: 17 12  Temp:      Last Pain:  Vitals:   04/02/17 1420  TempSrc:   PainSc: 8          Complications: No apparent anesthesia complications

## 2017-04-02 NOTE — Progress Notes (Signed)
PROGRESS NOTE    Patty Valdez  [name]:[id number] DOB: 1986-11-11 DOA: 03/31/2017 PCP: Patient, No Pcp Per   Brief Narrative: 31 year old female with remote history of IV drug abuse, hep C, depression, presented with worsening left arm pain and swelling and redness. Patient was seen in the emergency 2 days prior to admission for the diagnosis of abscess status post I and D, drain placed and discharge with oral Bactrim. Patient's hand redness and swelling worsened and therefore came to the hospital for further evaluation. Patient reported temperature of 101 at home.  Assessment & Plan:  #  Cellulitis and abscess of left upper extremity: -Patient with recent I and D at ER. The wound culture growing strep pyogenes and gram-positive cocci in cluster. Follow up final culture results. Continue IV vancomycin. -Hand surgery consult appreciated. Plan for or today. -Leukocytosis improved. -Continue pain medications with Vicodin, Motrin, Tylenol and IV morphine as needed. -Currently nothing by mouth for the procedure  #History of tobacco abuse: Patient declined nicotine patch.  #Anxiety depression: Mood is stable.  #History of hepatitis C: Recommend outpatient follow-up.  #Mild hypokalemia: Improved.  Active Problems:   Anxiety state   Cellulitis of left upper extremity   Abscess of left arm   GERD (gastroesophageal reflux disease)   Elevated liver enzymes  DVT prophylaxis: Lovenox subcutaneous Code Status:. Full Code Family Communication: Patient's family including mother and husband at that site  Disposition Plan: Likely discharge home in 1-2 days depending on surgeon's plan    Consultants:   General surgery  Procedures: None Antimicrobials: IV vancomycin since May 13  Subjective: Left upper extremity pain is temporally controlled with the current pain medication. Redness is worsening. Denied chills, nausea, vomiting, chest pain or shortness of breath. Patient's mother and  husband at bedside  Objective: Vitals:   04/01/17 1440 04/01/17 2209 04/02/17 0531 04/02/17 1220  BP: (!) 110/58 116/69 123/78 113/67  Pulse: 74 90 82 77  Resp: 18 18 16    Temp: 98 F (36.7 C) 98 F (36.7 C) 98 F (36.7 C) 98.4 F (36.9 C)  TempSrc: Oral Oral Oral Oral  SpO2: 99% 100% 100% 100%  Weight:      Height:        Intake/Output Summary (Last 24 hours) at 04/02/17 1244 Last data filed at 04/02/17 5784  Gross per 24 hour  Intake          3034.58 ml  Output             2350 ml  Net           684.58 ml   Filed Weights   03/31/17 1807  Weight: 77.1 kg (170 lb)    Examination:  General exam: Not in distress, lying on bed comfortable Respiratory system: Clear bilateral. Respiratory effort normal. No wheezing or crackle Cardiovascular system: Regular rate rhythm, S1-S2 normal. No pedal edema Gastrointestinal system: Abdomen is nondistended, soft and nontender. Normal bowel sounds heard. Central nervous system: Alert and oriented. No focal neurological deficits. Extremities: Left upper extremity redness and swelling, dressing applied. Skin: Erythematous changes of left forearm Psychiatry: Judgement and insight appear normal. Mood & affect appropriate.     Data Reviewed: I have personally reviewed following labs and imaging studies  CBC:  Recent Labs Lab 03/31/17 1835 04/01/17 0501  WBC 13.3* 7.8  NEUTROABS 9.8*  --   HGB 12.3 10.5*  HCT 36.4 31.4*  MCV 90.3 90.0  PLT 239 178   Basic Metabolic Panel:  Recent  Labs Lab 03/31/17 1835 04/01/17 0501 04/02/17 0327  NA 131* 134* 135  K 3.6 3.3* 4.0  CL 98* 102 109  CO2 23 21* 21*  GLUCOSE 107* 109* 112*  BUN 9 9 5*  CREATININE 1.13* 0.87 0.69  CALCIUM 8.6* 7.9* 8.1*   GFR: Estimated Creatinine Clearance: 98 mL/min (by C-G formula based on SCr of 0.69 mg/dL). Liver Function Tests:  Recent Labs Lab 03/31/17 1835 04/01/17 0501  AST 29 44*  ALT 68* 69*  ALKPHOS 144* 156*  BILITOT 0.7 0.5    PROT 8.7* 7.0  ALBUMIN 3.7 3.1*   No results for input(s): LIPASE, AMYLASE in the last 168 hours. No results for input(s): AMMONIA in the last 168 hours. Coagulation Profile: No results for input(s): INR, PROTIME in the last 168 hours. Cardiac Enzymes: No results for input(s): CKTOTAL, CKMB, CKMBINDEX, TROPONINI in the last 168 hours. BNP (last 3 results) No results for input(s): PROBNP in the last 8760 hours. HbA1C: No results for input(s): HGBA1C in the last 72 hours. CBG: No results for input(s): GLUCAP in the last 168 hours. Lipid Profile: No results for input(s): CHOL, HDL, LDLCALC, TRIG, CHOLHDL, LDLDIRECT in the last 72 hours. Thyroid Function Tests: No results for input(s): TSH, T4TOTAL, FREET4, T3FREE, THYROIDAB in the last 72 hours. Anemia Panel: No results for input(s): VITAMINB12, FOLATE, FERRITIN, TIBC, IRON, RETICCTPCT in the last 72 hours. Sepsis Labs:  Recent Labs Lab 03/31/17 1902  LATICACIDVEN 1.21    Recent Results (from the past 240 hour(s))  Wound or Superficial Culture     Status: None   Collection Time: 03/29/17  7:31 PM  Result Value Ref Range Status   Specimen Description WOUND LEFT FOREARM  Final   Special Requests NONE  Final   Gram Stain   Final    ABUNDANT WBC PRESENT, PREDOMINANTLY PMN ABUNDANT GRAM POSITIVE COCCI IN CLUSTERS    Culture FEW GROUP A STREP (S.PYOGENES) ISOLATED  Final   Report Status 04/01/2017 FINAL  Final  Culture, blood (routine x 2)     Status: None (Preliminary result)   Collection Time: 03/31/17 11:40 PM  Result Value Ref Range Status   Specimen Description BLOOD RIGHT WRIST  Final   Special Requests   Final    BOTTLES DRAWN AEROBIC AND ANAEROBIC Blood Culture adequate volume   Culture NO GROWTH 1 DAY  Final   Report Status PENDING  Incomplete  Culture, blood (routine x 2)     Status: None (Preliminary result)   Collection Time: 03/31/17 11:44 PM  Result Value Ref Range Status   Specimen Description BLOOD RIGHT  HAND  Final   Special Requests   Final    BOTTLES DRAWN AEROBIC ONLY Blood Culture adequate volume   Culture NO GROWTH 1 DAY  Final   Report Status PENDING  Incomplete  Surgical pcr screen     Status: None   Collection Time: 04/01/17  8:06 PM  Result Value Ref Range Status   MRSA, PCR NEGATIVE NEGATIVE Final   Staphylococcus aureus NEGATIVE NEGATIVE Final    Comment:        The Xpert SA Assay (FDA approved for NASAL specimens in patients over 32 years of age), is one component of a comprehensive surveillance program.  Test performance has been validated by [facility] for patients greater than or equal to 22 year old. It is not intended to diagnose infection nor to guide or monitor treatment.          Radiology  Studies: [place] Lt Upper Extrem Ltd Soft Tissue Non Vascular  Result Date: 03/31/2017 CLINICAL DATA:  Abscess EXAM: ULTRASOUND LEFT UPPER EXTREMITY LIMITED TECHNIQUE: Ultrasound examination of the upper extremity (left forearm) soft tissues was performed in the area of clinical concern. COMPARISON:  None FINDINGS: A 0.8 x 0.7 x 0.8 cm complex hypoechoic ill-defined and ill marginated fluid collection is seen within the subcutaneous soft tissues of the left forearm consistent with tiny abscess. Surrounding cellulitic change is noted. No vascularity is identified. IMPRESSION: Ill-defined subcutaneous soft tissue fluid collection in the area of interest involving the left forearm consistent with a tiny abscess measuring 0.8 x 0.7 x 0.8 cm. Adjacent tracking soft tissue edema consistent with cellulitis is noted. Electronically Signed   By: Tollie Ethavid  Kwon M.D.   On: 03/31/2017 23:26        Scheduled Meds: . [MAR Hold] enoxaparin (LOVENOX) injection  40 mg Subcutaneous Q24H  . [MAR Hold] potassium chloride  40 mEq Oral Daily   Continuous Infusions: . sodium chloride 75 mL/hr at 04/01/17 2124  . [MAR Hold] vancomycin Stopped (04/02/17 1136)     LOS: 2 days    Marvetta Vohs Jaynie CollinsPrasad  Ileane Sando, MD [facility] Pager [phone]  If 7PM-7AM, please contact night-coverage [email or web address] Password [email or web address] 04/02/2017, 12:44 PM

## 2017-04-02 NOTE — Anesthesia Preprocedure Evaluation (Addendum)
Anesthesia Evaluation  Patient identified by MRN, date of birth, ID band Patient awake    Reviewed: Allergy & Precautions, H&P , NPO status , Patient's Chart, lab work & pertinent test results  Airway Mallampati: II  TM Distance: >3 FB Neck ROM: Full    Dental no notable dental hx. (+) Teeth Intact, Dental Advisory Given   Pulmonary Current Smoker,    Pulmonary exam normal breath sounds clear to auscultation       Cardiovascular negative cardio ROS   Rhythm:Regular Rate:Normal     Neuro/Psych  Headaches, Anxiety Depression negative psych ROS   GI/Hepatic negative GI ROS, (+)     substance abuse  IV drug use,   Endo/Other  negative endocrine ROS  Renal/GU negative Renal ROS  negative genitourinary   Musculoskeletal   Abdominal   Peds  Hematology negative hematology ROS (+) anemia ,   Anesthesia Other Findings   Reproductive/Obstetrics negative OB ROS                            Anesthesia Physical Anesthesia Plan  ASA: II  Anesthesia Plan: General   Post-op Pain Management:    Induction: Intravenous  Airway Management Planned: LMA  Additional Equipment:   Intra-op Plan:   Post-operative Plan: Extubation in OR  Informed Consent: I have reviewed the patients History and Physical, chart, labs and discussed the procedure including the risks, benefits and alternatives for the proposed anesthesia with the patient or authorized representative who has indicated his/her understanding and acceptance.   Dental advisory given  Plan Discussed with: CRNA  Anesthesia Plan Comments:         Anesthesia Quick Evaluation

## 2017-04-03 ENCOUNTER — Encounter (HOSPITAL_COMMUNITY): Payer: Self-pay | Admitting: General Surgery

## 2017-04-03 LAB — VANCOMYCIN, TROUGH: Vancomycin Tr: 13 ug/mL — ABNORMAL LOW (ref 15–20)

## 2017-04-03 MED ORDER — VANCOMYCIN HCL IN DEXTROSE 1-5 GM/200ML-% IV SOLN
1000.0000 mg | Freq: Three times a day (TID) | INTRAVENOUS | Status: DC
  Administered 2017-04-03 – 2017-04-05 (×5): 1000 mg via INTRAVENOUS
  Filled 2017-04-03 (×7): qty 200

## 2017-04-03 NOTE — Progress Notes (Signed)
Pharmacy Antibiotic Note  Patty Valdez is a 31 y.o. female admitted on 03/31/2017 with cellulitis.  Pharmacy has been consulted for Vancomycin dosing.  Pt is afebrile and WBC normalized.  (+)Strep pyogenes in I&D cx at Urgent Care from 5/11.  Pt now s/p repeat I&D on 5/15 and wound cx pending.  Vanc trough today is a bit below goal of 15, drawn appropriately prior to dose.  Previous dose was given 1hr early.  Plan: Change Vancomycin to 1000mg  IV q8 Watch renal function and f/u culture results Repeat Vanc trough as appropriate  Height: 5\' 2"  (157.5 cm) Weight: 170 lb (77.1 kg) IBW/kg (Calculated) : 50.1  Temp (24hrs), Avg:98 F (36.7 C), Min:97 F (36.1 C), Max:98.6 F (37 C)   Recent Labs Lab 03/31/17 1835 03/31/17 1902 04/01/17 0501 04/02/17 0327 04/03/17 0848  WBC 13.3*  --  7.8  --   --   CREATININE 1.13*  --  0.87 0.69  --   LATICACIDVEN  --  1.21  --   --   --   VANCOTROUGH  --   --   --   --  13*    Estimated Creatinine Clearance: 98 mL/min (by C-G formula based on SCr of 0.69 mg/dL).    No Known Allergies  Antimicrobials this admission: Vanc 5/13 >>   Dose adjustments this admission: 5/16  VT 13 (drawn appropriately, last dose 1hr early)  Microbiology results: 5/11 wound (+)Strep pyogenes (outpatient I&D) 5/13 BCx - ntd 5/14 surgical screen - negative 5/15 Abscess (+)GPC-pairs  Thank you for allowing pharmacy to be a part of this patient's care.  Alvester MorinKendra Kayron Kalmar, B.S., PharmD Clinical Pharmacist [facility]

## 2017-04-03 NOTE — Progress Notes (Signed)
S:  Arm feels better  O:Blood pressure 129/78, pulse 77, temperature 98.6 F (37 C), temperature source Oral, resp. rate 17, height 5\' 2"  (1.575 m), weight 77.1 kg (170 lb), last menstrual period 03/20/2017, SpO2 100 %, not currently breastfeeding.  Dressing changed, still with erythema of arm less tender, wound base clean, no pus  A:s/p I&D   P:wond care: irrigate wound 3x daily, gentle pack (moist to dry) and cover, abx per cultures, once cultures return/appropriate abx chosen, pt may go home, f/u in office 1 week

## 2017-04-03 NOTE — Progress Notes (Signed)
[facility]  PROGRESS NOTE  JOSI ROEDIGER ZOX:[id number] DOB: 1985/12/09 DOA: 03/31/2017 PCP: Patient, No Pcp Per   Brief HPI:   31 year old female with remote history of IV drug abuse, hep C, depression, presented with worsening left arm pain and swelling and redness. Patient was seen in the emergency 2 days prior to admission for the diagnosis of abscess status post I and D, drain placed and discharge with oral Bactrim. Patient's hand redness and swelling worsened and therefore came to the hospital for further evaluation. Patient reported temperature of 101 at home.    Subjective   Patient is status post incision and drainage of deep abscess of the left forearm, continues to have pain. On morphine 4 mg IV every 3 hours when necessary   Assessment/Plan:     1. Cellulitis and abscess of left upper extremity- Patient underwent I&D of abscess in the left arm, wound culture growing strep pyogenes and gram-positive cocci in clusters. Continue IV vancomycin. Final report is pending. Once culture results resulted patient can be discharged on by mouth antibiotics. 2. Anxiety/depression- stable 3. History of hepatitis C- recommend outpatient follow-up.     DVT prophylaxis: Lovenox  Code Status: Full code  Family Communication: No family present at bedside   Disposition Plan: Likely home in 1-2 days, pending culture results.   Consultants:  Orthopedics  Procedures:  Insulin and drainage of abscess in  left forearm  Continuous infusions . sodium chloride 75 mL/hr at 04/02/17 1445  . lactated ringers 50 mL/hr at 04/02/17 1253  . vancomycin 1,000 mg (04/03/17 1802)      Antibiotics:   Anti-infectives    Start     Dose/Rate Route Frequency Ordered Stop   04/03/17 1800  vancomycin (VANCOCIN) IVPB 1000 mg/200 mL premix     1,000 mg 200 mL/hr over 60 Minutes Intravenous Every 8 hours 04/03/17 1018     04/01/17 1800  vancomycin (VANCOCIN) IVPB 750 mg/150 ml premix   Status:  Discontinued     750 mg 150 mL/hr over 60 Minutes Intravenous Every 8 hours 04/01/17 1311 04/03/17 1018   03/31/17 2200  vancomycin (VANCOCIN) IVPB 1000 mg/200 mL premix  Status:  Discontinued     1,000 mg 100 mL/hr over 120 Minutes Intravenous Every 12 hours 03/31/17 2119 04/01/17 1311       Objective   Vitals:   04/03/17 0316 04/03/17 0459 04/03/17 0515 04/03/17 1253  BP: (!) 100/51 119/66 119/66 129/78  Pulse: 79 79 79 77  Resp: 17 18 18 17   Temp: 97.8 F (36.6 C) 98.2 F (36.8 C) 98.2 F (36.8 C) 98.6 F (37 C)  TempSrc: Oral Oral Oral Oral  SpO2: 100% 98% 98% 100%  Weight:      Height:        Intake/Output Summary (Last 24 hours) at 04/03/17 1829 Last data filed at 04/03/17 1330  Gross per 24 hour  Intake          1596.25 ml  Output              900 ml  Net           696.25 ml   Filed Weights   03/31/17 1807  Weight: 77.1 kg (170 lb)     Physical Examination:   Physical Exam: Eyes: No icterus, extraocular muscles intact  Mouth: Oral mucosa is moist, no lesions on palate,  Neck: Supple, no deformities, masses, or tenderness Lungs: Normal respiratory effort, bilateral clear to auscultation, no crackles  or wheezes.  Heart: Regular rate and rhythm, S1 and S2 normal, no murmurs, rubs auscultated Abdomen: BS normoactive,soft,nondistended,non-tender to palpation,no organomegaly Extremities: Left arm in dressing Neuro : Alert and oriented to time, place and person, No focal deficits       Data Reviewed: I have personally reviewed following labs and imaging studies  CBG: No results for input(s): GLUCAP in the last 168 hours.  CBC:  Recent Labs Lab 03/31/17 1835 04/01/17 0501  WBC 13.3* 7.8  NEUTROABS 9.8*  --   HGB 12.3 10.5*  HCT 36.4 31.4*  MCV 90.3 90.0  PLT 239 178    Basic Metabolic Panel:  Recent Labs Lab 03/31/17 1835 04/01/17 0501 04/02/17 0327  NA 131* 134* 135  K 3.6 3.3* 4.0  CL 98* 102 109  CO2 23 21* 21*   GLUCOSE 107* 109* 112*  BUN 9 9 5*  CREATININE 1.13* 0.87 0.69  CALCIUM 8.6* 7.9* 8.1*    Recent Results (from the past 240 hour(s))  Wound or Superficial Culture     Status: None   Collection Time: 03/29/17  7:31 PM  Result Value Ref Range Status   Specimen Description WOUND LEFT FOREARM  Final   Special Requests NONE  Final   Gram Stain   Final    ABUNDANT WBC PRESENT, PREDOMINANTLY PMN ABUNDANT GRAM POSITIVE COCCI IN CLUSTERS    Culture FEW GROUP A STREP (S.PYOGENES) ISOLATED  Final   Report Status 04/01/2017 FINAL  Final  Culture, blood (routine x 2)     Status: None (Preliminary result)   Collection Time: 03/31/17 11:40 PM  Result Value Ref Range Status   Specimen Description BLOOD RIGHT WRIST  Final   Special Requests   Final    BOTTLES DRAWN AEROBIC AND ANAEROBIC Blood Culture adequate volume   Culture NO GROWTH 2 DAYS  Final   Report Status PENDING  Incomplete  Culture, blood (routine x 2)     Status: None (Preliminary result)   Collection Time: 03/31/17 11:44 PM  Result Value Ref Range Status   Specimen Description BLOOD RIGHT HAND  Final   Special Requests   Final    BOTTLES DRAWN AEROBIC ONLY Blood Culture adequate volume   Culture NO GROWTH 2 DAYS  Final   Report Status PENDING  Incomplete  Surgical pcr screen     Status: None   Collection Time: 04/01/17  8:06 PM  Result Value Ref Range Status   MRSA, PCR NEGATIVE NEGATIVE Final   Staphylococcus aureus NEGATIVE NEGATIVE Final    Comment:        The Xpert SA Assay (FDA approved for NASAL specimens in patients over 63 years of age), is one component of a comprehensive surveillance program.  Test performance has been validated by [facility] for patients greater than or equal to 27 year old. It is not intended to diagnose infection nor to guide or monitor treatment.   Aerobic/Anaerobic Culture (surgical/deep wound)     Status: None (Preliminary result)   Collection Time: 04/02/17  1:43 PM  Result Value  Ref Range Status   Specimen Description ABSCESS LEFT UPPER ARM  Final   Special Requests POF VANC  Final   Gram Stain   Final    ABUNDANT WBC PRESENT,BOTH PMN AND MONONUCLEAR RARE GRAM POSITIVE COCCI IN PAIRS    Culture CULTURE REINCUBATED FOR BETTER GROWTH  Final   Report Status PENDING  Incomplete     Liver Function Tests:  Recent Labs Lab 03/31/17 1835  04/01/17 0501  AST 29 44*  ALT 68* 69*  ALKPHOS 144* 156*  BILITOT 0.7 0.5  PROT 8.7* 7.0  ALBUMIN 3.7 3.1*   No results for input(s): LIPASE, AMYLASE in the last 168 hours. No results for input(s): AMMONIA in the last 168 hours.     Studies: No results found.  Scheduled Meds: . enoxaparin (LOVENOX) injection  40 mg Subcutaneous Q24H      Time spent: 25 min  [facility] S   [facility] Pager [phone]. If 7PM-7AM, please contact night-coverage at [email or web address], Office  [phone]  password [email or web address] 04/03/2017, 6:29 PM  LOS: 3 days

## 2017-04-03 NOTE — Op Note (Signed)
NAMRebekah Valdez:  Pignato, Christianna               ACCOUNT NO.:  [id number]  MEDICAL RECORD NO.:  [id number]  LOCATION:                                 FACILITY:  PHYSICIAN:  Johnette AbrahamHarrill C Jarriel Papillion, MD    DATE OF BIRTH:  [birth year]  DATE OF PROCEDURE:  04/02/2017 DATE OF DISCHARGE:                              OPERATIVE REPORT   PREOPERATIVE DIAGNOSIS:  Cellulitis and abscess to the left upper extremity.  POSTOPERATIVE DIAGNOSIS:  Cellulitis and abscess to the left upper extremity.  PROCEDURE:  Extensive incision and drainage of deep abscess of the left forearm.  INDICATIONS:  [name] is 31 year old female, who presented to the emergency room with a small boil in her mid upper forearm.  This was I and D'd at the Urgent Care.  She was placed on antibiotics. Subsequently, her arm became worse with spreading erythema and worsening pain and swelling and drainage.  She re-presented back to the emergency room, where she was admitted by the hospitalist service.  I was consulted for definitive care.  On evaluation, she had erythema up to her upper arm.  She had about a 1 cm hole in the middle of her antecubital fossa draining purulence, suspect for spreading proximal infection.  Risks, benefits, and alternatives of I and D were discussed with her.  She agreed to this treatment course.  Consent was obtained.  PROCEDURE DESCRIPTION:  The patient was taken to the operating room, placed supine on the operating room table.  A time-out was performed. Anesthesia was administered without difficulty.  The left upper extremity was prepped and draped in normal sterile fashion.  An incision overlying the previous I and D site was then made both proximally and distally.  Immediately, there was some purulent drainage that was sent for culture.  Probing of the wound, the wound was deep down to the antebrachial fascia.  The antebrachial fascia was opened.  There was not any deep abscess in this area.  Additional pockets  of loculated abscess were then looked for, none were found.  Next, the wound was thoroughly irrigated with irrigation solution. Hemostasis was achieved.  The wound was gently packed with a moist Kerlix, followed by dry dressing and Ace wrap.  Several milliliters of plain Marcaine were infiltrated in the subcutaneous tissues around the wound for postoperative pain control.  She tolerated procedure well.     Johnette AbrahamHarrill C Cartel Mauss, MD   ______________________________ Johnette AbrahamHarrill C Johneric Mcfadden, MD   HCC/MEDQ  D:  04/02/2017  T:  04/03/2017  Job:  [id number]

## 2017-04-04 MED ORDER — OXYCODONE-ACETAMINOPHEN 5-325 MG PO TABS: 1.0000 | ORAL_TABLET | Freq: Four times a day (QID) | ORAL | 0 refills | Status: DC | PRN

## 2017-04-04 NOTE — Progress Notes (Signed)
[facility]  PROGRESS NOTE  Patty SickleHeather E Valdez WUJ:[id number] DOB: August 09, 1986 DOA: 03/31/2017 PCP: Patient, No Pcp Per   Brief HPI:   31 year old female with remote history of IV drug abuse, hep C, depression, presented with worsening left arm pain and swelling and redness. Patient was seen in the emergency 2 days prior to admission for the diagnosis of abscess status post I and D, drain placed and discharge with oral Bactrim. Patient's hand redness and swelling worsened and therefore came to the hospital for further evaluation. Patient reported temperature of 101 at home.    Subjective   Patient seen and examined, pain is better. Wants to go home.   Assessment/Plan:     1. Cellulitis and abscess of left upper extremity- Patient underwent I&D of abscess in the left arm, wound culture growing strep pyogenes and gram-positive cocci in clusters. Continue IV vancomycin. Final report is pending. Once culture results resulted patient can be discharged on by mouth antibiotics.Will try to contact Orthopedics as patient wants to go home today. 2. Anxiety/depression- stable 3. History of hepatitis C- recommend outpatient follow-up.     DVT prophylaxis: Lovenox  Code Status: Full code  Family Communication: No family present at bedside   Disposition Plan: Likely home in 1-2 days, pending culture results.   Consultants:  Orthopedics  Procedures:  Insulin and drainage of abscess in  left forearm  Continuous infusions . sodium chloride 75 mL/hr at 04/04/17 1400  . lactated ringers 50 mL/hr at 04/02/17 1253  . vancomycin Stopped (04/04/17 1807)      Antibiotics:   Anti-infectives    Start     Dose/Rate Route Frequency Ordered Stop   04/03/17 1800  vancomycin (VANCOCIN) IVPB 1000 mg/200 mL premix     1,000 mg 200 mL/hr over 60 Minutes Intravenous Every 8 hours 04/03/17 1018     04/01/17 1800  vancomycin (VANCOCIN) IVPB 750 mg/150 ml premix  Status:  Discontinued     750  mg 150 mL/hr over 60 Minutes Intravenous Every 8 hours 04/01/17 1311 04/03/17 1018   03/31/17 2200  vancomycin (VANCOCIN) IVPB 1000 mg/200 mL premix  Status:  Discontinued     1,000 mg 100 mL/hr over 120 Minutes Intravenous Every 12 hours 03/31/17 2119 04/01/17 1311       Objective   Vitals:   04/03/17 1253 04/03/17 2028 04/04/17 0549 04/04/17 1557  BP: 129/78 132/79 (!) 114/53 127/74  Pulse: 77 73 72 85  Resp: 17 17 17 18   Temp: 98.6 F (37 C) 97.7 F (36.5 C) 97.5 F (36.4 C) 98.8 F (37.1 C)  TempSrc: Oral Oral Oral Oral  SpO2: 100% 100% 99% 99%  Weight:      Height:        Intake/Output Summary (Last 24 hours) at 04/04/17 1929 Last data filed at 04/04/17 1558  Gross per 24 hour  Intake             1820 ml  Output                0 ml  Net             1820 ml   Filed Weights   03/31/17 1807  Weight: 77.1 kg (170 lb)     Physical Examination:    Physical Exam: Eyes: No icterus, extraocular muscles intact  Mouth: Oral mucosa is moist, no lesions on palate,  Neck: Supple, no deformities, masses, or tenderness Lungs: Normal respiratory effort, bilateral clear to auscultation, no  crackles or wheezes.  Heart: Regular rate and rhythm, S1 and S2 normal, no murmurs, rubs auscultated Abdomen: BS normoactive,soft,nondistended,non-tender to palpation,no organomegaly Extremities: Left arm in dressing Neuro : Alert and oriented to time, place and person, No focal deficits      Data Reviewed: I have personally reviewed following labs and imaging studies  CBG: No results for input(s): GLUCAP in the last 168 hours.  CBC:  Recent Labs Lab 03/31/17 1835 04/01/17 0501  WBC 13.3* 7.8  NEUTROABS 9.8*  --   HGB 12.3 10.5*  HCT 36.4 31.4*  MCV 90.3 90.0  PLT 239 178    Basic Metabolic Panel:  Recent Labs Lab 03/31/17 1835 04/01/17 0501 04/02/17 0327  NA 131* 134* 135  K 3.6 3.3* 4.0  CL 98* 102 109  CO2 23 21* 21*  GLUCOSE 107* 109* 112*  BUN 9 9 5*   CREATININE 1.13* 0.87 0.69  CALCIUM 8.6* 7.9* 8.1*    Recent Results (from the past 240 hour(s))  Wound or Superficial Culture     Status: None   Collection Time: 03/29/17  7:31 PM  Result Value Ref Range Status   Specimen Description WOUND LEFT FOREARM  Final   Special Requests NONE  Final   Gram Stain   Final    ABUNDANT WBC PRESENT, PREDOMINANTLY PMN ABUNDANT GRAM POSITIVE COCCI IN CLUSTERS    Culture FEW GROUP A STREP (S.PYOGENES) ISOLATED  Final   Report Status 04/01/2017 FINAL  Final  Culture, blood (routine x 2)     Status: None (Preliminary result)   Collection Time: 03/31/17 11:40 PM  Result Value Ref Range Status   Specimen Description BLOOD RIGHT WRIST  Final   Special Requests   Final    BOTTLES DRAWN AEROBIC AND ANAEROBIC Blood Culture adequate volume   Culture NO GROWTH 3 DAYS  Final   Report Status PENDING  Incomplete  Culture, blood (routine x 2)     Status: None (Preliminary result)   Collection Time: 03/31/17 11:44 PM  Result Value Ref Range Status   Specimen Description BLOOD RIGHT HAND  Final   Special Requests   Final    BOTTLES DRAWN AEROBIC ONLY Blood Culture adequate volume   Culture NO GROWTH 3 DAYS  Final   Report Status PENDING  Incomplete  Surgical pcr screen     Status: None   Collection Time: 04/01/17  8:06 PM  Result Value Ref Range Status   MRSA, PCR NEGATIVE NEGATIVE Final   Staphylococcus aureus NEGATIVE NEGATIVE Final    Comment:        The Xpert SA Assay (FDA approved for NASAL specimens in patients over 35 years of age), is one component of a comprehensive surveillance program.  Test performance has been validated by [facility] for patients greater than or equal to 72 year old. It is not intended to diagnose infection nor to guide or monitor treatment.   Aerobic/Anaerobic Culture (surgical/deep wound)     Status: None (Preliminary result)   Collection Time: 04/02/17  1:43 PM  Result Value Ref Range Status   Specimen  Description ABSCESS LEFT UPPER ARM  Final   Special Requests POF VANC  Final   Gram Stain   Final    ABUNDANT WBC PRESENT,BOTH PMN AND MONONUCLEAR RARE GRAM POSITIVE COCCI IN PAIRS    Culture RARE STREPTOCOCCUS PYOGENES  Final   Report Status PENDING  Incomplete     Liver Function Tests:  Recent Labs Lab 03/31/17 1835 04/01/17 0501  AST 29 44*  ALT 68* 69*  ALKPHOS 144* 156*  BILITOT 0.7 0.5  PROT 8.7* 7.0  ALBUMIN 3.7 3.1*   No results for input(s): LIPASE, AMYLASE in the last 168 hours. No results for input(s): AMMONIA in the last 168 hours.     Studies: No results found.  Scheduled Meds: . enoxaparin (LOVENOX) injection  40 mg Subcutaneous Q24H      Time spent: 25 min  [facility]   [facility] Pager [phone]. If 7PM-7AM, please contact night-coverage at [email or web address], Office  [phone]  password [email or web address] 04/04/2017, 7:29 PM  LOS: 4 days

## 2017-04-05 LAB — BASIC METABOLIC PANEL
ANION GAP: 7 (ref 5–15)
CALCIUM: 8.2 mg/dL — AB (ref 8.9–10.3)
CO2: 25 mmol/L (ref 22–32)
Chloride: 106 mmol/L (ref 101–111)
Creatinine, Ser: 0.53 mg/dL (ref 0.44–1.00)
GFR calc Af Amer: >60 mL/min (ref >60)
GLUCOSE: 88 mg/dL (ref 65–99)
POTASSIUM: 3.3 mmol/L — AB (ref 3.5–5.1)
Sodium: 138 mmol/L (ref 135–145)

## 2017-04-05 MED ORDER — POTASSIUM CHLORIDE CRYS ER 20 MEQ PO TBCR
40.0000 meq | EXTENDED_RELEASE_TABLET | Freq: Once | ORAL | Status: AC
  Administered 2017-04-05: 40 meq via ORAL
  Filled 2017-04-05: qty 2

## 2017-04-05 MED ORDER — AMOXICILLIN-POT CLAVULANATE 875-125 MG PO TABS: 1.0000 | ORAL_TABLET | Freq: Two times a day (BID) | ORAL | 0 refills | Status: AC

## 2017-04-05 NOTE — Care Management Note (Signed)
Case Management Note  Patient Details  Name: Patty Valdez MRN: [id number] Date of Birth: Aug 14, 1986  Subjective/Objective:                    Action/Plan: Bedside nurse instructing patient on wound care   Expected Discharge Date:  04/05/17               Expected Discharge Plan:  Home/Self Care  In-House Referral:     Discharge planning Services     Post Acute Care Choice:    Choice offered to:     DME Arranged:    DME Agency:     HH Arranged:    HH Agency:     Status of Service:  Completed, signed off  If discussed at [employer] Length of Stay Meetings, dates discussed:    Additional Comments:  [name], [name], RN 04/05/2017, 10:35 AM

## 2017-04-05 NOTE — Discharge Summary (Addendum)
Physician Discharge Summary  Patty Valdez WJX:[id number] DOB: [birth year] DOA: 03/31/2017  PCP: Patient, No Pcp Per  Admit date: 03/31/2017 Discharge date: 04/05/2017  Time spent: *25 minutes  Recommendations for Outpatient Follow-up:  1. Follow up Dr Izora Ribas in 1 week  Discharge Diagnoses:  Active Problems:   Anxiety state   Cellulitis of left upper extremity   Abscess of left arm   GERD (gastroesophageal reflux disease)   Elevated liver enzymes   Pain management   Hypokalemia   Discharge Condition: Stable  Diet recommendation: Regular diet  Filed Weights   03/31/17 1807  Weight: 77.1 kg (170 lb)    History of present illness:  31 year old female with remote history of IV drug abuse, hep C, depression, presented with worsening left arm pain and swelling and redness. Patient was seen in the emergency 2 days prior to admission for the diagnosis of abscess status post I and D, drain placed and discharge with oral Bactrim. Patient's hand redness and swelling worsened and therefore came to the hospital for further evaluation. Patient reported temperature of 101 at home.   Hospital Course:   1. Cellulitis and abscess of left upper extremity- Patient underwent I&D of abscess in the left arm, wound culture growing strep pyogenes . Patient was started on vancomycin, received five days of vancomycin. Will discharge home on Po Augmentin 1 tab po BID for 7 days. I called and discussed the above plan with Dr Izora Ribas, he agrees with plan. Patient will follow up Dr Izora Ribas in 1 week. Will give percocet 1 tab po q 6 hr prn for pain 2. Anxiety/depression- stable 3. Hypokalemia- potassium is 3.3 today, will give one dose of K dur 40 meq po x 1 today before discharge. 4. History of hepatitis C- recommend outpatient follow-up. Discussed with patient, she will follow up her PCP [facility].  Procedures:  I and D of left antecubital fossa.  Consultations:  Orthopedics  Discharge  Exam: Vitals:   04/04/17 2056 04/05/17 0507  BP: (!) 142/87 111/68  Pulse: 74 67  Resp: 18 16  Temp: 98.9 F (37.2 C) 97.9 F (36.6 C)    General: Appears in no acute distress Cardiovascular: RRR, S1S2 Respiratory: Clear bilaterally Ext- left arm has no erythema, dressing in place  Discharge Instructions   Discharge Instructions    Diet - low sodium heart healthy    Complete by:  As directed    Discharge instructions    Complete by:  As directed    irrigate wound 3x daily, gentle pack (moist to dry) and cover   Increase activity slowly    Complete by:  As directed      Current Discharge Medication List    START taking these medications   Details  amoxicillin-clavulanate (AUGMENTIN) 875-125 MG tablet Take 1 tablet by mouth 2 (two) times daily. Qty: 14 tablet, Refills: 0    oxyCODONE-acetaminophen (ROXICET) 5-325 MG tablet Take 1 tablet by mouth every 6 (six) hours as needed. Qty: 20 tablet, Refills: 0      CONTINUE these medications which have NOT CHANGED   Details  calcium carbonate (TUMS - DOSED IN MG ELEMENTAL CALCIUM) 500 MG chewable tablet Chew 4 tablets by mouth 2 (two) times daily as needed for indigestion or heartburn.    ondansetron (ZOFRAN ODT) 4 MG disintegrating tablet Take 1 tablet (4 mg total) by mouth every 8 (eight) hours as needed for nausea or vomiting. Qty: 20 tablet, Refills: 0      STOP  taking these medications     aspirin-acetaminophen-caffeine (EXCEDRIN MIGRAINE) 250-250-65 MG tablet      Ca Carbonate-Mag Hydroxide (ROLAIDS PO)        No Known Allergies Follow-up Information    Knute Neuoley, Harrill, MD. Schedule an appointment as soon as possible for a visit in 1 week(s).   Specialty:  General Surgery Contact information: [address] [place] [address] [phone]            The results of significant diagnostics from this hospitalization (including imaging, microbiology, ancillary and laboratory) are listed  below for reference.    Significant Diagnostic Studies: [place] Lt Upper Extrem Ltd Soft Tissue Non Vascular  Result Date: 03/31/2017 CLINICAL DATA:  Abscess EXAM: ULTRASOUND LEFT UPPER EXTREMITY LIMITED TECHNIQUE: Ultrasound examination of the upper extremity (left forearm) soft tissues was performed in the area of clinical concern. COMPARISON:  None FINDINGS: A 0.8 x 0.7 x 0.8 cm complex hypoechoic ill-defined and ill marginated fluid collection is seen within the subcutaneous soft tissues of the left forearm consistent with tiny abscess. Surrounding cellulitic change is noted. No vascularity is identified. IMPRESSION: Ill-defined subcutaneous soft tissue fluid collection in the area of interest involving the left forearm consistent with a tiny abscess measuring 0.8 x 0.7 x 0.8 cm. Adjacent tracking soft tissue edema consistent with cellulitis is noted. Electronically Signed   By: Tollie Ethavid  Kwon M.D.   On: 03/31/2017 23:26    Microbiology: Recent Results (from the past 240 hour(s))  Wound or Superficial Culture     Status: None   Collection Time: 03/29/17  7:31 PM  Result Value Ref Range Status   Specimen Description WOUND LEFT FOREARM  Final   Special Requests NONE  Final   Gram Stain   Final    ABUNDANT WBC PRESENT, PREDOMINANTLY PMN ABUNDANT GRAM POSITIVE COCCI IN CLUSTERS    Culture FEW GROUP A STREP (S.PYOGENES) ISOLATED  Final   Report Status 04/01/2017 FINAL  Final  Culture, blood (routine x 2)     Status: None (Preliminary result)   Collection Time: 03/31/17 11:40 PM  Result Value Ref Range Status   Specimen Description BLOOD RIGHT WRIST  Final   Special Requests   Final    BOTTLES DRAWN AEROBIC AND ANAEROBIC Blood Culture adequate volume   Culture NO GROWTH 3 DAYS  Final   Report Status PENDING  Incomplete  Culture, blood (routine x 2)     Status: None (Preliminary result)   Collection Time: 03/31/17 11:44 PM  Result Value Ref Range Status   Specimen Description BLOOD RIGHT HAND   Final   Special Requests   Final    BOTTLES DRAWN AEROBIC ONLY Blood Culture adequate volume   Culture NO GROWTH 3 DAYS  Final   Report Status PENDING  Incomplete  Surgical pcr screen     Status: None   Collection Time: 04/01/17  8:06 PM  Result Value Ref Range Status   MRSA, PCR NEGATIVE NEGATIVE Final   Staphylococcus aureus NEGATIVE NEGATIVE Final    Comment:        The Xpert SA Assay (FDA approved for NASAL specimens in patients over 32 years of age), is one component of a comprehensive surveillance program.  Test performance has been validated by [facility] for patients greater than or equal to 32 year old. It is not intended to diagnose infection nor to guide or monitor treatment.   Aerobic/Anaerobic Culture (surgical/deep wound)     Status: None (Preliminary result)  Collection Time: 04/02/17  1:43 PM  Result Value Ref Range Status   Specimen Description ABSCESS LEFT UPPER ARM  Final   Special Requests POF VANC  Final   Gram Stain   Final    ABUNDANT WBC PRESENT,BOTH PMN AND MONONUCLEAR RARE GRAM POSITIVE COCCI IN PAIRS    Culture RARE STREPTOCOCCUS PYOGENES  Final   Report Status PENDING  Incomplete     Labs: Basic Metabolic Panel:  Recent Labs Lab 03/31/17 1835 04/01/17 0501 04/02/17 0327 04/05/17 0539  NA 131* 134* 135 138  K 3.6 3.3* 4.0 3.3*  CL 98* 102 109 106  CO2 23 21* 21* 25  GLUCOSE 107* 109* 112* 88  BUN 9 9 5* <5*  CREATININE 1.13* 0.87 0.69 0.53  CALCIUM 8.6* 7.9* 8.1* 8.2*   Liver Function Tests:  Recent Labs Lab 03/31/17 1835 04/01/17 0501  AST 29 44*  ALT 68* 69*  ALKPHOS 144* 156*  BILITOT 0.7 0.5  PROT 8.7* 7.0  ALBUMIN 3.7 3.1*   No results for input(s): LIPASE, AMYLASE in the last 168 hours. No results for input(s): AMMONIA in the last 168 hours. CBC:  Recent Labs Lab 03/31/17 1835 04/01/17 0501  WBC 13.3* 7.8  NEUTROABS 9.8*  --   HGB 12.3 10.5*  HCT 36.4 31.4*  MCV 90.3 90.0  PLT 239 178        Signed:  Merriel Zinger S MD.  [facility] 04/05/2017, 9:34 AM

## 2017-04-05 NOTE — Progress Notes (Signed)
Patient discharged to home with instruction and prescription. Demonstrated dressing change and sent some dressing supplies.

## 2017-04-06 LAB — CULTURE, BLOOD (ROUTINE X 2)
Culture: NO GROWTH
Culture: NO GROWTH
SPECIAL REQUESTS: ADEQUATE
Special Requests: ADEQUATE

## 2017-04-07 LAB — AEROBIC/ANAEROBIC CULTURE W GRAM STAIN (SURGICAL/DEEP WOUND)

## 2017-04-07 LAB — AEROBIC/ANAEROBIC CULTURE (SURGICAL/DEEP WOUND)

## 2017-04-25 ENCOUNTER — Emergency Department (HOSPITAL_COMMUNITY): Payer: Medicaid Other

## 2017-04-25 ENCOUNTER — Other Ambulatory Visit: Payer: Self-pay

## 2017-04-25 ENCOUNTER — Emergency Department (HOSPITAL_COMMUNITY)
Admission: EM | Admit: 2017-04-25 | Discharge: 2017-04-25 | Disposition: A | Discharge status: Home / Self Care | Payer: Medicaid Other | Attending: Emergency Medicine | Admitting: Emergency Medicine

## 2017-04-25 DIAGNOSIS — Z79899 Other long term (current) drug therapy: Secondary | ICD-10-CM | POA: Insufficient documentation

## 2017-04-25 DIAGNOSIS — F17210 Nicotine dependence, cigarettes, uncomplicated: Secondary | ICD-10-CM | POA: Diagnosis not present

## 2017-04-25 DIAGNOSIS — J181 Lobar pneumonia, unspecified organism: Secondary | ICD-10-CM

## 2017-04-25 DIAGNOSIS — R091 Pleurisy: Secondary | ICD-10-CM | POA: Diagnosis not present

## 2017-04-25 DIAGNOSIS — J189 Pneumonia, unspecified organism: Secondary | ICD-10-CM | POA: Insufficient documentation

## 2017-04-25 DIAGNOSIS — R1031 Right lower quadrant pain: Secondary | ICD-10-CM | POA: Diagnosis present

## 2017-04-25 LAB — COMPREHENSIVE METABOLIC PANEL
ALBUMIN: 3.6 g/dL (ref 3.5–5.0)
ALK PHOS: 111 U/L (ref 38–126)
ALT: 31 U/L (ref 14–54)
AST: 23 U/L (ref 15–41)
Anion gap: 7 (ref 5–15)
BILIRUBIN TOTAL: 0.6 mg/dL (ref 0.3–1.2)
BUN: 7 mg/dL (ref 6–20)
CO2: 25 mmol/L (ref 22–32)
CREATININE: 0.62 mg/dL (ref 0.44–1.00)
Calcium: 8.3 mg/dL — ABNORMAL LOW (ref 8.9–10.3)
Chloride: 102 mmol/L (ref 101–111)
GFR calc Af Amer: >60 mL/min (ref >60)
GFR calc non Af Amer: >60 mL/min (ref >60)
Glucose, Bld: 109 mg/dL — ABNORMAL HIGH (ref 65–99)
POTASSIUM: 3.4 mmol/L — AB (ref 3.5–5.1)
Sodium: 134 mmol/L — ABNORMAL LOW (ref 135–145)
TOTAL PROTEIN: 7.6 g/dL (ref 6.5–8.1)

## 2017-04-25 LAB — CBC WITH DIFFERENTIAL/PLATELET
BASOS ABS: 0 10*3/uL (ref 0.0–0.1)
BASOS PCT: 0 %
Eosinophils Absolute: 0 10*3/uL (ref 0.0–0.7)
Eosinophils Relative: 0 %
HEMATOCRIT: 29.9 % — AB (ref 36.0–46.0)
HEMOGLOBIN: 10.1 g/dL — AB (ref 12.0–15.0)
LYMPHS PCT: 17 %
Lymphs Abs: 1.9 10*3/uL (ref 0.7–4.0)
MCH: 29.7 pg (ref 26.0–34.0)
MCHC: 33.8 g/dL (ref 30.0–36.0)
MCV: 87.9 fL (ref 78.0–100.0)
Monocytes Absolute: 1.1 10*3/uL — ABNORMAL HIGH (ref 0.1–1.0)
Monocytes Relative: 10 %
NEUTROS ABS: 8.2 10*3/uL — AB (ref 1.7–7.7)
NEUTROS PCT: 73 %
Platelets: 243 10*3/uL (ref 150–400)
RBC: 3.4 MIL/uL — AB (ref 3.87–5.11)
RDW: 13.2 % (ref 11.5–15.5)
WBC: 11.3 10*3/uL — AB (ref 4.0–10.5)

## 2017-04-25 LAB — URINALYSIS, ROUTINE W REFLEX MICROSCOPIC
Bacteria, UA: NONE SEEN
Bilirubin Urine: NEGATIVE
Glucose, UA: NEGATIVE mg/dL
KETONES UR: NEGATIVE mg/dL
Leukocytes, UA: NEGATIVE
Nitrite: NEGATIVE
PROTEIN: NEGATIVE mg/dL
Specific Gravity, Urine: 1.014 (ref 1.005–1.030)
pH: 5 (ref 5.0–8.0)

## 2017-04-25 LAB — I-STAT BETA HCG BLOOD, ED (MC, WL, AP ONLY): I-stat hCG, quantitative: <5 m[IU]/mL (ref <5)

## 2017-04-25 LAB — D-DIMER, QUANTITATIVE: D-Dimer, Quant: 0.3 ug/mL-FEU (ref 0.00–0.50)

## 2017-04-25 LAB — LIPASE, BLOOD: Lipase: 22 U/L (ref 11–51)

## 2017-04-25 MED ORDER — IBUPROFEN 800 MG PO TABS: 800.0000 mg | ORAL_TABLET | Freq: Three times a day (TID) | ORAL | 0 refills | Status: DC | PRN

## 2017-04-25 MED ORDER — SODIUM CHLORIDE 0.9 % IV BOLUS (SEPSIS)
1000.0000 mL | Freq: Once | INTRAVENOUS | Status: AC
  Administered 2017-04-25: 1000 mL via INTRAVENOUS

## 2017-04-25 MED ORDER — ONDANSETRON 4 MG PO TBDP: 4.0000 mg | ORAL_TABLET | Freq: Three times a day (TID) | ORAL | 0 refills | Status: DC | PRN

## 2017-04-25 MED ORDER — MORPHINE SULFATE (PF) 2 MG/ML IV SOLN
4.0000 mg | Freq: Once | INTRAVENOUS | Status: AC
  Administered 2017-04-25: 4 mg via INTRAVENOUS
  Filled 2017-04-25: qty 2

## 2017-04-25 MED ORDER — ONDANSETRON HCL 4 MG/2ML IJ SOLN
4.0000 mg | Freq: Once | INTRAMUSCULAR | Status: AC
  Administered 2017-04-25: 4 mg via INTRAVENOUS
  Filled 2017-04-25: qty 2

## 2017-04-25 MED ORDER — OXYCODONE-ACETAMINOPHEN 5-325 MG PO TABS: 1.0000 | ORAL_TABLET | Freq: Four times a day (QID) | ORAL | 0 refills | Status: DC | PRN

## 2017-04-25 MED ORDER — LEVOFLOXACIN IN D5W 750 MG/150ML IV SOLN
750.0000 mg | Freq: Once | INTRAVENOUS | Status: AC
  Administered 2017-04-25: 750 mg via INTRAVENOUS
  Filled 2017-04-25: qty 150

## 2017-04-25 MED ORDER — LEVOFLOXACIN 750 MG PO TABS: 750.0000 mg | ORAL_TABLET | Freq: Every day | ORAL | 0 refills | Status: DC

## 2017-04-25 MED ORDER — HYDROMORPHONE HCL 1 MG/ML IJ SOLN
1.0000 mg | Freq: Once | INTRAMUSCULAR | Status: AC
  Administered 2017-04-25: 1 mg via INTRAVENOUS
  Filled 2017-04-25: qty 1

## 2017-04-25 NOTE — ED Provider Notes (Signed)
By signing my name below, I, [name], attest that this documentation has been prepared under the direction and in the presence of [name], Layla MawKristen N, DO. Electronically Signed: [name], Scribe. 04/25/2017. 3:21 AM.  TIME SEEN: 3:21 AM  CHIEF COMPLAINT: Flank pain (right)  HPI:  HPI Comments: Celso SickleHeather E Valdez is a 31 y.o. female who presents to the Emergency Department complaining of constant moderate right lower lateral sharp severe chest pain that started yesterday. She states her pain worsened tonight so she came to the ED. No known trama. Her pain was exacerbated by movement and breathing. She reports fever (Tmax: 101F) and cough onset yesterday. Pt's temperature in the ED today was 97.59F. She admits to a hx of cholecystectomy. Denies any hx of IV drug use, hx of kidney stones, vomiting, diarrhea, dysuria, hematuria, vaginal bleeding/discharge, bowel/bladder incontinence, urinary retention, numbness, tingling or focal weakness, or other complaints at this time.  Patient was just admitted to the hospital for incision and drainage in the operating room by Dr. Izora Ribasoley for a left arm abscess. She denies history of IV drug abuse. She is not a diabetic. This has appeared to have healed well. Was discharged on 04/05/2017. Not currently on antibiotics.   No history of PE or DVT. No lower extremity swelling or pain.   ROS: See HPI Constitutional: no fever  Eyes: no drainage  ENT: no runny nose   Cardiovascular:  Right lower chest pain  Resp: no SOB  GI: no vomiting, no diarrhea GU:  no dysuria, no hematuria, no vaginal bleeding/discharge Integumentary: no rash  Allergy: no hives  Musculoskeletal: no leg swelling  Neurological: no slurred speech ROS otherwise negative  PAST MEDICAL HISTORY/PAST SURGICAL HISTORY:  Past Medical History:  Diagnosis Date  . Abdominal pain   . Anemia 10 yrs ago  . Anxiety   . Complication of anesthesia 04-08-2009   low blood pressure after epidural,  had to take iv meds for  . Gallstones   . Genital herpes    no current meds for  . Migraine   . Nausea   . Obesity   . Vaginal bleeding     MEDICATIONS:  Prior to Admission medications   Medication Sig Start Date End Date Taking? Authorizing Provider  calcium carbonate (TUMS - DOSED IN MG ELEMENTAL CALCIUM) 500 MG chewable tablet Chew 4 tablets by mouth 2 (two) times daily as needed for indigestion or heartburn.    [provider]  ondansetron (ZOFRAN ODT) 4 MG disintegrating tablet Take 1 tablet (4 mg total) by mouth every 8 (eight) hours as needed for nausea or vomiting. 08/15/16   Gerhard MunchLockwood, Robert, MD  oxyCODONE-acetaminophen (ROXICET) 5-325 MG tablet Take 1 tablet by mouth every 6 (six) hours as needed. 04/04/17 04/04/18  Meredeth IdeLama, Gagan S, MD    ALLERGIES:  No Known Allergies  SOCIAL HISTORY:  Social History  Substance Use Topics  . Smoking status: Current Every Day Smoker    Packs/day: 0.50    Years: 10.00    Types: Cigarettes  . Smokeless tobacco: Never Used  . Alcohol use No     Comment: last drink was 1 yr ago    FAMILY HISTORY: Family History  Problem Relation Age of Onset  . Cancer Maternal Grandfather        prostate  . Hypertension Mother   . Diabetes Mother   . Neuropathy Mother     EXAM: BP 111/64 (BP Location: Right Arm)   Pulse 84   Temp 97.9  F (36.6 C) (Oral)   SpO2 98%  CONSTITUTIONAL: Alert and oriented and responds appropriately to questions. Well-appearing; well-nourished, Appears uncomfortable but is afebrile and nontoxic, well-hydrated HEAD: Normocephalic EYES: Conjunctivae clear, pupils appear equal, EOMI ENT: normal nose; moist mucous membranes NECK: Supple, no meningismus, no nuchal rigidity, no LAD  CARD: RRR; S1 and S2 appreciated; no murmurs, no clicks, no rubs, no gallops CHEST:  Chest wall is nontender to palpation.  No crepitus, ecchymosis, erythema, warmth, rash or other lesions present.   RESP: Normal chest excursion  without splinting or tachypnea; breath sounds clear and equal bilaterally; no wheezes, no rhonchi, no rales, no hypoxia or respiratory distress, speaking full sentences ABD/GI: Normal bowel sounds; non-distended; soft, non-tender, no rebound, no guarding, no peritoneal signs, no hepatosplenomegaly BACK:  The back appears normal and is non-tender to palpation, there is no CVA tenderness, no midline spinal tenderness or step-off or deformity EXT: Normal ROM in all joints; non-tender to palpation; no edema; normal capillary refill; no cyanosis, no calf tenderness or swelling, compartments are soft    SKIN: Normal color for age and race; warm; no rash, completely healed wound to the left forearm with no sign of fluctuance, erythema or warmth NEURO: Moves all extremities equally PSYCH: The patient's mood and manner are appropriate. Grooming and personal hygiene are appropriate.  MEDICAL DECISION MAKING: Patient with complaints of right lower chest pain. I'm not able to reproduce it with palpation and she has no tenderness in the right upper quadrant or new CVA tenderness. Concern for possible pulmonary bolus given recent hospitalization. Does report fever of 101 yesterday and dry cough. Also concern for pneumonia. Differential also includes polynephritis, kidney stone. She is status post cholecystectomy. Doubt bowel obstruction, colitis. No tenderness at McBurney's point. We'll obtain labs, chest x-ray, EKG. We'll give IV fluids, pain and nausea medication.  ED PROGRESS: Patient's chest x-ray shows a right lower lobe pneumonia. Suspect she is having pleurisy because of this and that this is the cause of her symptoms today. D-dimer negative. Mild leukocytosis. Patient wants discharge home. We'll give dose of IV Levaquin given she was recently admitted to the hospital and discharged with Levaquin for home. We'll discharge with Percocet for pain control. Urine shows no sign of infection, 0-5 rbc's. LFTs and lipase  unremarkable. Discussed return precautions and have given outpatient PCP follow-up. Patient comfortable with this plan.  At this time, I do not feel there is any life-threatening condition present. I have reviewed and discussed all results (EKG, imaging, lab, urine as appropriate) and exam findings with patient/family. I have reviewed nursing notes and appropriate previous records.  I feel the patient is safe to be discharged home without further emergent workup and can continue workup as an outpatient as needed. Discussed usual and customary return precautions. Patient/family verbalize understanding and are comfortable with this plan.  Outpatient follow-up has been provided if needed. All questions have been answered.    Date: 04/25/2017 3:56 AM  Rate: 82  Rhythm: normal sinus rhythm  QRS Axis: normal  Intervals: normal  ST/T Wave abnormalities: normal  Conduction Disutrbances: none  Narrative Interpretation: unremarkable, borderline short PR interval with a PR interval of 119 ms   I personally performed the services described in this documentation, which was scribed in my presence. The recorded information has been reviewed and is accurate.      Navia Lindahl, Layla Maw, DO 04/25/17 [phone]

## 2017-04-25 NOTE — ED Notes (Signed)
Bed: WA09 Expected date:  Expected time:  Means of arrival:  Comments: EMS 31 yo female from home-lower back and flank pain-pain meds administered

## 2017-04-25 NOTE — Discharge Instructions (Signed)
To find a primary care or specialty doctor please call [phone] or [phone] to access "[facility] Find a Doctor Service." ° °You may also go on the [facility] website at [email or web address] ° °There are also multiple [facility], [facility] practices throughout [place] that are frequently accepting new patients. You may find a clinic that is close to your home and contact them. ° °[facility] -  °[address] °[place] [address] °[phone] ° ° °[facility] -  °[address] °[place] [address] [phone] ° ° °[facility] - °[address]  °[place] [address] °[phone] ° ° °

## 2017-04-25 NOTE — ED Triage Notes (Addendum)
Pt presents w/ right lower back to flank area pain that started yesterday which worsened tonight. Pt denies dysuria. Pt received 200 mcg Fentanyl via EMS.   BP 114/86 P 100 SPO2 99%

## 2017-07-18 ENCOUNTER — Encounter (HOSPITAL_COMMUNITY): Payer: Self-pay | Admitting: Emergency Medicine

## 2017-07-18 ENCOUNTER — Emergency Department (HOSPITAL_COMMUNITY)
Admission: EM | Admit: 2017-07-18 | Discharge: 2017-07-19 | Disposition: A | Discharge status: Home / Self Care | Payer: Medicaid Other | Attending: Emergency Medicine | Admitting: Emergency Medicine

## 2017-07-18 DIAGNOSIS — F17210 Nicotine dependence, cigarettes, uncomplicated: Secondary | ICD-10-CM | POA: Diagnosis not present

## 2017-07-18 DIAGNOSIS — G2402 Drug induced acute dystonia: Secondary | ICD-10-CM | POA: Diagnosis not present

## 2017-07-18 DIAGNOSIS — F410 Panic disorder [episodic paroxysmal anxiety] without agoraphobia: Secondary | ICD-10-CM | POA: Diagnosis present

## 2017-07-18 DIAGNOSIS — G2581 Restless legs syndrome: Secondary | ICD-10-CM | POA: Diagnosis not present

## 2017-07-18 DIAGNOSIS — Z79899 Other long term (current) drug therapy: Secondary | ICD-10-CM | POA: Insufficient documentation

## 2017-07-18 HISTORY — DX: Unspecified viral hepatitis C without hepatic coma: B19.20

## 2017-07-18 LAB — CBC
HEMATOCRIT: 35.2 % — AB (ref 36.0–46.0)
HEMOGLOBIN: 12.1 g/dL (ref 12.0–15.0)
MCH: 29.9 pg (ref 26.0–34.0)
MCHC: 34.4 g/dL (ref 30.0–36.0)
MCV: 86.9 fL (ref 78.0–100.0)
Platelets: 385 10*3/uL (ref 150–400)
RBC: 4.05 MIL/uL (ref 3.87–5.11)
RDW: 14.4 % (ref 11.5–15.5)
WBC: 10.2 10*3/uL (ref 4.0–10.5)

## 2017-07-18 LAB — URINALYSIS, ROUTINE W REFLEX MICROSCOPIC
Bilirubin Urine: NEGATIVE
Glucose, UA: NEGATIVE mg/dL
Hgb urine dipstick: NEGATIVE
Ketones, ur: NEGATIVE mg/dL
Leukocytes, UA: NEGATIVE
Nitrite: NEGATIVE
Protein, ur: NEGATIVE mg/dL
Specific Gravity, Urine: 1.012 (ref 1.005–1.030)
pH: 8 (ref 5.0–8.0)

## 2017-07-18 LAB — BASIC METABOLIC PANEL WITH GFR
Anion gap: 9 (ref 5–15)
BUN: 9 mg/dL (ref 6–20)
CO2: 21 mmol/L — ABNORMAL LOW (ref 22–32)
Calcium: 9.3 mg/dL (ref 8.9–10.3)
Chloride: 106 mmol/L (ref 101–111)
Creatinine, Ser: 0.42 mg/dL — ABNORMAL LOW (ref 0.44–1.00)
GFR calc Af Amer: >60 mL/min
GFR calc non Af Amer: >60 mL/min
Glucose, Bld: 110 mg/dL — ABNORMAL HIGH (ref 65–99)
Potassium: 3.8 mmol/L (ref 3.5–5.1)
Sodium: 136 mmol/L (ref 135–145)

## 2017-07-18 LAB — RAPID URINE DRUG SCREEN, HOSP PERFORMED
Amphetamines: NOT DETECTED
Barbiturates: NOT DETECTED
Benzodiazepines: POSITIVE — AB
COCAINE: NOT DETECTED
OPIATES: POSITIVE — AB
Tetrahydrocannabinol: POSITIVE — AB

## 2017-07-18 MED ORDER — LORAZEPAM 1 MG PO TABS
1.0000 mg | ORAL_TABLET | Freq: Once | ORAL | Status: AC
  Administered 2017-07-18: 1 mg via ORAL
  Filled 2017-07-18: qty 1

## 2017-07-18 NOTE — ED Provider Notes (Signed)
[facility]-EMERGENCY DEPT Provider Note   CSN: [id number] Arrival date & time: 07/18/17  1956     History   Chief Complaint Chief Complaint  Patient presents with  . Panic Attack    HPI Patty Valdez is a 31 y.o. female with hx of anxiety, MDD with recurrent episodes and poly substance abuse and taking suboxone, xanax and  tramadol who presents to the ED via EMS for a panic attack. Patient reports she was watching TV she began shaking all over and hyperventilating. Patient reported feeling like her insides were going to bust out. Patient dose not take medication for anxiety. Patient reports that she felt weird and can not sit still. Patient states she has had S/I in the past but not now.   Patient's mother reports the patient has been "picking at her skin".  The history is provided by the patient. No language interpreter was used.  Mental Health Problem  Presenting symptoms: agitation, bizarre behavior and depression   Presenting symptoms: no homicidal ideas and no suicidal thoughts   Patient accompanied by:  Family member and parent Onset quality:  Sudden Duration:  4 hours Timing:  Constant Progression:  Waxing and waning Chronicity:  New Context: stressful life event   Treatment compliance:  All of the time Relieved by:  Nothing Worsened by:  Nothing Ineffective treatments:  Anti-anxiety medications Associated symptoms: anxiety, feelings of worthlessness and irritability   Associated symptoms: no abdominal pain   Risk factors: hx of suicide attempts     Past Medical History:  Diagnosis Date  . Abdominal pain   . Anemia 10 yrs ago  . Anxiety   . Complication of anesthesia 04-08-2009   low blood pressure after epidural, had to take iv meds for  . Gallstones   . Genital herpes    no current meds for  . Hepatitis C   . Migraine   . Nausea   . Obesity   . Vaginal bleeding     Patient Active Problem List   Diagnosis Date Noted  . Pain management   . Hypokalemia     . GERD (gastroesophageal reflux disease) 04/01/2017  . Elevated liver enzymes 04/01/2017  . Cellulitis of left upper extremity 03/31/2017  . Abscess of left arm 03/31/2017  . MDD (major depressive disorder), recurrent severe, without psychosis (HCC) 06/11/2015  . Pregnancy 05/15/2015  . S/P cesarean section 05/15/2015  . [redacted] weeks gestation of pregnancy   . Abnormal fetal ultrasound   . Encounter for fetal anatomic survey   . Low lying placenta without hemorrhage, antepartum   . [redacted] weeks gestation of pregnancy   . MDD (major depressive disorder), recurrent episode, severe (HCC) 06/14/2014  . Drug overdose 06/11/2014  . Decreased level of consciousness 06/11/2014  . Hypotension due to drugs 06/11/2014  . Suicide attempt by drug ingestion (HCC) 06/11/2014  . Poisoning by carbamazepine 06/11/2014  . Chronic cholecystitis with calculus 01/08/2012  . IRREGULAR MENSES 08/11/2009  . OBESITY, UNSPECIFIED 07/15/2009  . Anxiety state 07/15/2009  . HERPES SIMPLEX INFECTION 04/20/2008  . NICOTINE ADDICTION 04/13/2008    Past Surgical History:  Procedure Laterality Date  . CESAREAN SECTION N/A 05/15/2015   Procedure: CESAREAN SECTION;  Surgeon: Kathreen Cosier, MD;  Location: [facility];  Service: Obstetrics;  Laterality: N/A;  . CHOLECYSTECTOMY    . I&D EXTREMITY Left 04/02/2017   Procedure: IRRIGATION AND DEBRIDEMENT EXTREMITY;  Surgeon: Knute Neu, MD;  Location: [facility] OR;  Service: Plastics;  Laterality: Left;  .  NO PAST SURGERIES      OB History    Gravida Para Term Preterm AB Living   3 3 3     3    SAB TAB Ectopic Multiple Live Births         0 3       Home Medications    Prior to Admission medications   Medication Sig Start Date End Date Taking? Authorizing Provider  alprazolam Prudy Feeler) 2 MG tablet Take 2 mg by mouth daily as needed for anxiety.    Yes [provider]  SUBOXONE 8-2 MG FILM Take 1 each by mouth 3 (three) times daily. 07/17/17  Yes [provider]  traMADol (ULTRAM) 50 MG tablet Take 50 mg by mouth every 6 (six) hours as needed for moderate pain.   Yes [provider]  ibuprofen (ADVIL,MOTRIN) 800 MG tablet Take 1 tablet (800 mg total) by mouth every 8 (eight) hours as needed for mild pain. Patient not taking: Reported on 07/18/2017 04/25/17   Ward, Layla Maw, DO  levofloxacin (LEVAQUIN) 750 MG tablet Take 1 tablet (750 mg total) by mouth daily. Patient not taking: Reported on 07/18/2017 04/25/17   Ward, Layla Maw, DO  ondansetron (ZOFRAN ODT) 4 MG disintegrating tablet Take 1 tablet (4 mg total) by mouth every 8 (eight) hours as needed for nausea or vomiting. Patient not taking: Reported on 07/18/2017 04/25/17   Ward, Layla Maw, DO  oxyCODONE-acetaminophen (PERCOCET/ROXICET) 5-325 MG tablet Take 1-2 tablets by mouth every 6 (six) hours as needed. Patient not taking: Reported on 07/18/2017 04/25/17   Ward, Layla Maw, DO    Family History Family History  Problem Relation Age of Onset  . Cancer Maternal Grandfather        prostate  . Hypertension Mother   . Diabetes Mother   . Neuropathy Mother     Social History Social History  Substance Use Topics  . Smoking status: Current Every Day Smoker    Packs/day: 0.50    Years: 10.00    Types: Cigarettes  . Smokeless tobacco: Never Used  . Alcohol use No     Comment: last drink was 1 yr ago     Allergies   Patient has no known allergies.   Review of Systems Review of Systems  Constitutional: Positive for irritability. Negative for fever.  HENT: Negative.   Eyes: Negative for visual disturbance.  Respiratory: Positive for shortness of breath.   Gastrointestinal: Negative for abdominal pain, nausea and vomiting.  Genitourinary: Positive for dysuria and frequency.  Musculoskeletal: Negative for neck pain.  Skin: Negative for rash.  Neurological: Negative for syncope.  Psychiatric/Behavioral: Positive for agitation and sleep disturbance. Negative for homicidal  ideas and suicidal ideas. The patient is nervous/anxious.    Patient unable to sit still in exam room. Pacing back and forth.   Physical Exam Updated Vital Signs BP 134/76 (BP Location: Right Arm)   Pulse 92   Temp 98.3 F (36.8 C) (Oral)   Resp (!) 22   LMP  (LMP Unknown)   SpO2 100%   Physical Exam  Constitutional: She is oriented to person, place, and time. She appears well-developed and well-nourished. No distress.  HENT:  Head: Normocephalic.  Eyes: EOM are normal.  Neck: Normal range of motion. Neck supple.  Cardiovascular: Normal rate and regular rhythm.   Pulmonary/Chest: Effort normal and breath sounds normal.  Abdominal: Soft. There is no tenderness.  Musculoskeletal: Normal range of motion.  Neurological: She is alert and  oriented to person, place, and time. No cranial nerve deficit.  Skin: Skin is warm and dry.  Psychiatric: Her mood appears anxious. She is agitated and hyperactive.  Nursing note and vitals reviewed.    ED Treatments / Results  Labs (all labs ordered are listed, but only abnormal results are displayed) Labs Reviewed - No data to display  Radiology No results found.  Procedures Procedures (including critical care time)  Medications Ordered in ED Medications - No data to display   Initial Impression / Assessment and Plan / ED Course  I have reviewed the triage vital signs and the nursing notes.   Final Clinical Impressions(s) / ED Diagnoses  New Prescriptions New Prescriptions   No medications on file   [facility], [facility] @ 11:15pm labs pending   Janne Napoleoneese, Jamonica Schoff M, NP 07/18/17 2321    Shaune PollackIsaacs, [place], MD 07/19/17 1158

## 2017-07-18 NOTE — ED Notes (Signed)
Hope, NP at bedside.  

## 2017-07-18 NOTE — ED Triage Notes (Signed)
Pt brought in by EMS for panic attack  Pt states it started when she was watching TV   Pt shaking all over and hyperventilating  Pt told EMS she feels like her insides are going to bust out  Pt states she does not take any medication for her anxiety

## 2017-07-18 NOTE — ED Notes (Signed)
Gave report to Lisa, RN

## 2017-07-19 LAB — I-STAT BETA HCG BLOOD, ED (MC, WL, AP ONLY): I-stat hCG, quantitative: <5 m[IU]/mL (ref <5)

## 2017-07-19 MED ORDER — BENZTROPINE MESYLATE 1 MG/ML IJ SOLN
2.0000 mg | Freq: Once | INTRAMUSCULAR | Status: AC
  Administered 2017-07-19: 2 mg via INTRAMUSCULAR
  Filled 2017-07-19: qty 2

## 2017-07-19 NOTE — Discharge Instructions (Signed)
Take benadryl for symptoms tomorrow. Return if worsening.

## 2017-07-19 NOTE — ED Provider Notes (Signed)
12:30 AM Patient in emergency department with anxiety, leg restlessness, she states when she ever she sitting down she is unable to control her legs. She took Xanax off the streets, and has smoked weed, she also took some opiates. She is unsure of anything was spiked. She was initially evaluated by Kerrie BuffaloHope Neese, NP, ab work is unremarkable, pregnancy test negative, UA normal. Her drug screen is positive for opiates, benzodiazepines, cannabinoids. I reassess patient, she appears to be very agitated, when she is walking she states she feels fine, however when she sits down her legs are jumping and twitching. Question whether this could be a reaction to a drug that she took. Her exam otherwise unremarkable. Will try Cogentin. Patient also would like to be discharged home, she states she is tired and ready to go. Pt denies SI or HI.   Vitals:   07/18/17 2002  BP: 134/76  Pulse: 92  Resp: (!) 22  Temp: 98.3 F (36.8 C)  TempSrc: Oral  SpO2: 100%      Patty Valdez, Patty Mims, Patty Valdez 07/19/17 [id number]    Patty Valdez, [name], Patty Valdez 07/19/17 [phone]

## 2017-07-19 NOTE — ED Notes (Signed)
ED Provider at bedside. 

## 2018-08-19 ENCOUNTER — Other Ambulatory Visit: Payer: Self-pay

## 2018-08-19 ENCOUNTER — Emergency Department (HOSPITAL_COMMUNITY)
Admission: EM | Admit: 2018-08-19 | Discharge: 2018-08-19 | Disposition: A | Discharge status: Home / Self Care | Payer: Medicaid Other | Attending: Emergency Medicine | Admitting: Emergency Medicine

## 2018-08-19 ENCOUNTER — Emergency Department (HOSPITAL_COMMUNITY): Payer: Medicaid Other

## 2018-08-19 ENCOUNTER — Encounter (HOSPITAL_COMMUNITY): Payer: Self-pay | Admitting: Emergency Medicine

## 2018-08-19 DIAGNOSIS — Z79899 Other long term (current) drug therapy: Secondary | ICD-10-CM | POA: Diagnosis not present

## 2018-08-19 DIAGNOSIS — R569 Unspecified convulsions: Secondary | ICD-10-CM

## 2018-08-19 DIAGNOSIS — F17210 Nicotine dependence, cigarettes, uncomplicated: Secondary | ICD-10-CM | POA: Insufficient documentation

## 2018-08-19 DIAGNOSIS — G40909 Epilepsy, unspecified, not intractable, without status epilepticus: Secondary | ICD-10-CM | POA: Diagnosis present

## 2018-08-19 LAB — CBC
HCT: 41.4 % (ref 36.0–46.0)
Hemoglobin: 14 g/dL (ref 12.0–15.0)
MCH: 31.4 pg (ref 26.0–34.0)
MCHC: 33.8 g/dL (ref 30.0–36.0)
MCV: 92.8 fL (ref 78.0–100.0)
Platelets: 336 10*3/uL (ref 150–400)
RBC: 4.46 MIL/uL (ref 3.87–5.11)
RDW: 12.4 % (ref 11.5–15.5)
WBC: 9.9 10*3/uL (ref 4.0–10.5)

## 2018-08-19 LAB — BASIC METABOLIC PANEL
Anion gap: 12 (ref 5–15)
BUN: 6 mg/dL (ref 6–20)
CO2: 18 mmol/L — AB (ref 22–32)
CREATININE: 0.83 mg/dL (ref 0.44–1.00)
Calcium: 9.5 mg/dL (ref 8.9–10.3)
Chloride: 104 mmol/L (ref 98–111)
GFR calc non Af Amer: >60 mL/min (ref >60)
Glucose, Bld: 122 mg/dL — ABNORMAL HIGH (ref 70–99)
Potassium: 3.8 mmol/L (ref 3.5–5.1)
SODIUM: 134 mmol/L — AB (ref 135–145)

## 2018-08-19 LAB — I-STAT BETA HCG BLOOD, ED (MC, WL, AP ONLY): I-stat hCG, quantitative: <5 m[IU]/mL (ref <5)

## 2018-08-19 LAB — CBG MONITORING, ED: GLUCOSE-CAPILLARY: 105 mg/dL — AB (ref 70–99)

## 2018-08-19 MED ORDER — BUPRENORPHINE HCL-NALOXONE HCL 8-2 MG SL SUBL
1.0000 | SUBLINGUAL_TABLET | Freq: Once | SUBLINGUAL | Status: AC
  Administered 2018-08-19: 1 via SUBLINGUAL
  Filled 2018-08-19: qty 1

## 2018-08-19 MED ORDER — METHOCARBAMOL 500 MG PO TABS: 500.0000 mg | ORAL_TABLET | Freq: Three times a day (TID) | ORAL | Status: DC | PRN

## 2018-08-19 MED ORDER — DICYCLOMINE HCL 20 MG PO TABS: 20.0000 mg | ORAL_TABLET | Freq: Four times a day (QID) | ORAL | Status: DC | PRN

## 2018-08-19 MED ORDER — HYDROXYZINE HCL 25 MG PO TABS
25.0000 mg | ORAL_TABLET | Freq: Four times a day (QID) | ORAL | Status: DC | PRN
  Administered 2018-08-19: 25 mg via ORAL
  Filled 2018-08-19: qty 1

## 2018-08-19 MED ORDER — LORAZEPAM 2 MG/ML IJ SOLN
0.5000 mg | Freq: Once | INTRAMUSCULAR | Status: AC
  Administered 2018-08-19: 0.5 mg via INTRAVENOUS
  Filled 2018-08-19: qty 1

## 2018-08-19 MED ORDER — NAPROXEN 250 MG PO TABS: 500.0000 mg | ORAL_TABLET | Freq: Two times a day (BID) | ORAL | Status: DC | PRN

## 2018-08-19 MED ORDER — ONDANSETRON 4 MG PO TBDP: 4.0000 mg | ORAL_TABLET | Freq: Four times a day (QID) | ORAL | Status: DC | PRN

## 2018-08-19 MED ORDER — LOPERAMIDE HCL 2 MG PO CAPS: 2.0000 mg | ORAL_CAPSULE | ORAL | Status: DC | PRN

## 2018-08-19 NOTE — ED Notes (Signed)
Delay in I-Stat beta. Running same now.

## 2018-08-19 NOTE — ED Notes (Signed)
CBG RESULT 101

## 2018-08-19 NOTE — Discharge Instructions (Addendum)
As discussed, today's evaluation has been generally reassuring. There is some suspicion for your seizures occurring at least in part due to your medications. Please stop using tramadol, and as much as possible, begin tapering your Suboxone dosing.  According to [place] law, you can not drive for 6 months after your seizure.  You should discuss this with your physician.  Return here for concerning changes in your condition.

## 2018-08-19 NOTE — ED Notes (Signed)
ED Provider at bedside. 

## 2018-08-19 NOTE — ED Notes (Signed)
Pt found at bottom of bed, walking around room- mother states that pt had taken 1/2 of suboxone strip this am and then had an episode of what mother describes as "slumped over, then had shaking, " pt states that she has not been feeling good for a few days.  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  Assisted back into bed and monitor placed  Back on.

## 2018-08-19 NOTE — ED Triage Notes (Signed)
Pt brought in by [facility]. Pts mom called 63 when she witnessed her daughter having an active seizure. Mom reports it lasted for 5 minutes. When EMS arrived they noted the pt was A&Ox4 with not post-ictal activity. Pt was visibly upset and crying. No HX of seizures. Pt only took Suboxone today.  CBG 168, Bp 128/68, O2 98%, RR 16

## 2018-08-19 NOTE — ED Provider Notes (Signed)
[facility] EMERGENCY DEPARTMENT Provider Note   CSN: [id number] Arrival date & time: 08/19/18  1207     History   Chief Complaint Chief Complaint  Patient presents with  . Seizures    HPI Patty Valdez is a 32 y.o. female.  HPI Patient presents with her mother who assists with the HPI. Patient has no history of seizures, does have a history of anxiety, hypertension, and substance abuse. She is currently using Suboxone. Patient notes that she has been feeling unwell for several days, without focal complaint. However, she has no history of seizure, but today, while with her mother, after taking partial dose of Suboxone she had an episode of witnessed seizure activity. No trauma, activity lasted about 5 minutes, was characteristically consistent with seizure, with shaking, absence of interactivity, foaming at the mouth. After a prolonged period of returning to normal, the patient is now awake and alert, states that she still feels generally unwell, without focal pain.  She denies taking anything other than cigarettes and Suboxone, denies recent health changes, medication changes, diet changes.   Past Medical History:  Diagnosis Date  . Abdominal pain   . Anemia 10 yrs ago  . Anxiety   . Complication of anesthesia 04-08-2009   low blood pressure after epidural, had to take iv meds for  . Gallstones   . Genital herpes    no current meds for  . Hepatitis C   . Migraine   . Nausea   . Obesity   . Vaginal bleeding     Patient Active Problem List   Diagnosis Date Noted  . Pain management   . Hypokalemia   . GERD (gastroesophageal reflux disease) 04/01/2017  . Elevated liver enzymes 04/01/2017  . Cellulitis of left upper extremity 03/31/2017  . Abscess of left arm 03/31/2017  . MDD (major depressive disorder), recurrent severe, without psychosis (HCC) 06/11/2015  . Pregnancy 05/15/2015  . S/P cesarean section 05/15/2015  . [redacted] weeks gestation of  pregnancy   . Abnormal fetal ultrasound   . Encounter for fetal anatomic survey   . Low lying placenta without hemorrhage, antepartum   . [redacted] weeks gestation of pregnancy   . MDD (major depressive disorder), recurrent episode, severe (HCC) 06/14/2014  . Drug overdose 06/11/2014  . Decreased level of consciousness 06/11/2014  . Hypotension due to drugs 06/11/2014  . Suicide attempt by drug ingestion (HCC) 06/11/2014  . Poisoning by carbamazepine 06/11/2014  . Chronic cholecystitis with calculus 01/08/2012  . IRREGULAR MENSES 08/11/2009  . OBESITY, UNSPECIFIED 07/15/2009  . Anxiety state 07/15/2009  . HERPES SIMPLEX INFECTION 04/20/2008  . NICOTINE ADDICTION 04/13/2008    Past Surgical History:  Procedure Laterality Date  . CESAREAN SECTION N/A 05/15/2015   Procedure: CESAREAN SECTION;  Surgeon: Kathreen Cosier, MD;  Location: [facility] OR[facility];  Service: Obstetrics;  Laterality: N/A;  . CHOLECYSTECTOMY    . I&D EXTREMITY Left 04/02/2017   Procedure: IRRIGATION AND DEBRIDEMENT EXTREMITY;  Surgeon: Knute Neu, MD;  Location: [facility] OR;  Service: Plastics;  Laterality: Left;  . NO PAST SURGERIES       OB History    Gravida  3   Para  3   Term  3   Preterm      AB      Living  3     SAB      TAB      Ectopic      Multiple  0   Live Births  3            Home Medications    Prior to Admission medications   Medication Sig Start Date End Date Taking? Authorizing Provider  alprazolam Prudy Feeler) 2 MG tablet Take 2 mg by mouth daily as needed for anxiety.     [provider]  ibuprofen (ADVIL,MOTRIN) 800 MG tablet Take 1 tablet (800 mg total) by mouth every 8 (eight) hours as needed for mild pain. Patient not taking: Reported on 07/18/2017 04/25/17   Ward, Layla Maw, DO  levofloxacin (LEVAQUIN) 750 MG tablet Take 1 tablet (750 mg total) by mouth daily. Patient not taking: Reported on 07/18/2017 04/25/17   Ward, Layla Maw, DO  ondansetron (ZOFRAN ODT) 4 MG disintegrating  tablet Take 1 tablet (4 mg total) by mouth every 8 (eight) hours as needed for nausea or vomiting. Patient not taking: Reported on 07/18/2017 04/25/17   Ward, Layla Maw, DO  oxyCODONE-acetaminophen (PERCOCET/ROXICET) 5-325 MG tablet Take 1-2 tablets by mouth every 6 (six) hours as needed. Patient not taking: Reported on 07/18/2017 04/25/17   Ward, Baxter Hire N, DO  SUBOXONE 8-2 MG FILM Take 1 each by mouth 3 (three) times daily. 07/17/17   [provider]  traMADol (ULTRAM) 50 MG tablet Take 50 mg by mouth every 6 (six) hours as needed for moderate pain.    [provider]    Family History Family History  Problem Relation Age of Onset  . Cancer Maternal Grandfather        prostate  . Hypertension Mother   . Diabetes Mother   . Neuropathy Mother     Social History Social History   Tobacco Use  . Smoking status: Current Every Day Smoker    Packs/day: 0.50    Years: 10.00    Pack years: 5.00    Types: Cigarettes  . Smokeless tobacco: Never Used  Substance Use Topics  . Alcohol use: No    Comment: last drink was 1 yr ago  . Drug use: Yes    Frequency: 2.0 times per week    Types: Marijuana     Allergies   Patient has no known allergies.   Review of Systems Review of Systems  Constitutional:       Per HPI, otherwise negative  HENT:       Per HPI, otherwise negative  Respiratory:       Per HPI, otherwise negative  Cardiovascular:       Per HPI, otherwise negative  Gastrointestinal: Negative for vomiting.  Endocrine:       Negative aside from HPI  Genitourinary:       Neg aside from HPI   Musculoskeletal:       Per HPI, otherwise negative  Skin: Negative.   Neurological: Positive for seizures.  Psychiatric/Behavioral: The patient is nervous/anxious.      Physical Exam Updated Vital Signs BP 119/77   Pulse 81   Temp 98.8 F (37.1 C) (Oral)   Resp 16   Ht 5\' 2"  (1.575 m)   Wt 77.1 kg   SpO2 100%   BMI 31.09 kg/m   Physical Exam    Constitutional: She is oriented to person, place, and time. She appears well-developed and well-nourished. No distress.  HENT:  Head: Normocephalic and atraumatic.  Eyes: Conjunctivae and EOM are normal.  Cardiovascular: Normal rate and regular rhythm.  Pulmonary/Chest: Effort normal and breath sounds normal. No stridor. No respiratory distress.  Abdominal: She exhibits no distension.  Musculoskeletal: She exhibits  no edema.  Neurological: She is alert and oriented to person, place, and time. No cranial nerve deficit.  Skin: Skin is warm and dry.  Psychiatric: Her mood appears anxious.  Nursing note and vitals reviewed.    ED Treatments / Results  Labs (all labs ordered are listed, but only abnormal results are displayed) Labs Reviewed  BASIC METABOLIC PANEL - Abnormal; Notable for the following components:      Result Value   Sodium 134 (*)    CO2 18 (*)    Glucose, Bld 122 (*)    All other components within normal limits  CBG MONITORING, ED - Abnormal; Notable for the following components:   Glucose-Capillary 105 (*)    All other components within normal limits  CBC  I-STAT BETA HCG BLOOD, ED ([facility], [facility], [facility] ONLY)    Radiology Ct Head Wo Contrast  Result Date: 08/19/2018 CLINICAL DATA:  Pt brought in by [facility]. Pts mom called 911 when she witnessed her daughter having an active seizure. Mom reports it lasted for 5 minutes. When EMS arrived they noted the pt was A&Ox4 with not post-ictal activity. Pt was visibly upset and crying. No HX of seizures EXAM: CT HEAD WITHOUT CONTRAST TECHNIQUE: Contiguous axial images were obtained from the base of the skull through the vertex without intravenous contrast. COMPARISON:  02/10/2011 FINDINGS: Brain: No evidence of acute infarction, hemorrhage, hydrocephalus, extra-axial collection or mass lesion/mass effect. Vascular: No hyperdense vessel or unexpected calcification. Skull: Normal. Negative for fracture or focal lesion. Sinuses/Orbits: Globes  and orbits are unremarkable. Visualized sinuses and mastoid air cells are clear. Other: None. IMPRESSION: Normal unenhanced CT scan of the brain. Electronically Signed   By: Amie Portland M.D.   On: 08/19/2018 15:32    Procedures Procedures (including critical care time)  Medications Ordered in ED Medications  dicyclomine (BENTYL) tablet 20 mg (has no administration in time range)  hydrOXYzine (ATARAX/VISTARIL) tablet 25 mg (25 mg Oral Given 08/19/18 1425)  loperamide (IMODIUM) capsule 2-4 mg (has no administration in time range)  methocarbamol (ROBAXIN) tablet 500 mg (has no administration in time range)  naproxen (NAPROSYN) tablet 500 mg (has no administration in time range)  ondansetron (ZOFRAN-ODT) disintegrating tablet 4 mg (has no administration in time range)  LORazepam (ATIVAN) injection 0.5 mg (0.5 mg Intravenous Given 08/19/18 1306)  buprenorphine-naloxone (SUBOXONE) 8-2 mg per SL tablet 1 tablet (1 tablet Sublingual Given 08/19/18 1503)     Initial Impression / Assessment and Plan / ED Course  I have reviewed the triage vital signs and the nursing notes.  Pertinent labs & imaging results that were available during my care of the patient were reviewed by me and considered in my medical decision making (see chart for details).    Update: Patient accompanied by her husband as well as mother and aunt. We discussed findings thus far and concern for possible medication related seizure activity, lowering of the seizure threshold They now notes the patient has also been using tramadol, and each of these medications, tramadol and Suboxone are both known to lower seizure threshold. I discussed patient's case with our neurology colleagues, confirming this.  We discussed interventions, including cessation of medications versus initiation of medication, and former is recommended.  CT unremarkable  3:40 PM Patient has received 1 dose of Suboxone, 1 dose of Vistaril, is calm, in no  distress, no shaking. With a lengthy conversation about all findings, need to stop taking tramadol, to taper Suboxone as possible. Patient is scheduled to  see her Suboxone clinic tomorrow morning, low suspicion for withdrawal in this interval. She discharged with return precautions, follow-up instructions.  Final Clinical Impressions(s) / ED Diagnoses  Seizure   Gerhard Munch, MD 08/19/18 1542

## 2018-08-19 NOTE — ED Notes (Signed)
Patient transported to CT 

## 2018-11-19 NOTE — L&D Delivery Note (Signed)
[facility] Delivery Note  [name] is a 33 y.o. [id number] s/p uncomplicated SVD at [redacted]w[redacted]d. She was admitted for preterm labor.   ROM: AROM at approx 0130 with thick meconium, particulate fluid GBS Status: positive, received amp 2g x1 Maximum Maternal Temperature: 98.2  Delivery Date/Time: 06/22/2019 @0216  Delivery: Called to room and patient was complete and pushing. Head delivered LOA. No nuchal cord present. Shoulder and body delivered in usual fashion. Infant with spontaneous cry, placed on mother's abdomen, dried and stimulated. Cord clamped x 2 after 1-minute delay, and cut by FOB. Cord blood drawn. Placenta delivered spontaneously with gentle cord traction. Fundus firm with massage and Pitocin. Labia, perineum, vagina, and cervix inspected inspected with no lacerations present.   Placenta: spontaneous, intact, 3v Complications: none Lacerations: none EBL: 325 Analgesia: CSE  Postpartum Planning [x]  message to sent to schedule follow-up  [ ]  pt has not received tdap [ ]  post partum IUD at f/u  Infant: vigorous female  APGARs 9/9  weight pending  Corliss Blacker, [place]

## 2019-01-16 ENCOUNTER — Ambulatory Visit: Payer: Self-pay

## 2019-04-16 ENCOUNTER — Ambulatory Visit (HOSPITAL_COMMUNITY)
Admission: EM | Admit: 2019-04-16 | Discharge: 2019-04-16 | Disposition: A | Discharge status: Home / Self Care | Payer: Medicaid Other | Attending: Family Medicine | Admitting: Family Medicine

## 2019-04-16 ENCOUNTER — Other Ambulatory Visit: Payer: Self-pay

## 2019-04-16 ENCOUNTER — Encounter (HOSPITAL_COMMUNITY): Payer: Self-pay | Admitting: Family Medicine

## 2019-04-16 DIAGNOSIS — K0889 Other specified disorders of teeth and supporting structures: Secondary | ICD-10-CM

## 2019-04-16 MED ORDER — CLINDAMYCIN HCL 300 MG PO CAPS: 300.0000 mg | ORAL_CAPSULE | Freq: Three times a day (TID) | ORAL | 0 refills | Status: DC

## 2019-04-16 NOTE — ED Triage Notes (Signed)
Pt states she has abscess tooth and gum. Pt states she [redacted] weeks pregnant. Pt states she's in pain.

## 2019-04-20 ENCOUNTER — Encounter: Payer: Self-pay | Admitting: General Practice

## 2019-04-21 ENCOUNTER — Encounter: Payer: Self-pay | Admitting: General Practice

## 2019-04-21 ENCOUNTER — Ambulatory Visit (INDEPENDENT_AMBULATORY_CARE_PROVIDER_SITE_OTHER): Payer: Medicaid Other | Admitting: *Deleted

## 2019-04-21 ENCOUNTER — Telehealth: Payer: Self-pay | Admitting: General Practice

## 2019-04-21 ENCOUNTER — Other Ambulatory Visit: Payer: Self-pay

## 2019-04-21 VITALS — Wt 170.0 lb

## 2019-04-21 DIAGNOSIS — Z3480 Encounter for supervision of other normal pregnancy, unspecified trimester: Secondary | ICD-10-CM

## 2019-04-21 DIAGNOSIS — O0990 Supervision of high risk pregnancy, unspecified, unspecified trimester: Secondary | ICD-10-CM

## 2019-04-21 NOTE — ED Provider Notes (Addendum)
[facility]   [id number] 04/16/19 Arrival Time: 1944  ASSESSMENT & PLAN:  1. Pain, dental    No sign of abscess requiring I&D at this time. Discussed.  Meds ordered this encounter  Medications  . clindamycin (CLEOCIN) 300 MG capsule    Sig: Take 1 capsule (300 mg total) by mouth 3 (three) times daily.    Dispense:  30 capsule    Refill:  0   Dental resource written instructions given. She will schedule dental evaluation as soon as possible if not improving over the next 24-48 hours. OTC ibuprofen as needed.  Reviewed expectations re: course of current medical issues. Questions answered. Outlined signs and symptoms indicating need for more acute intervention. Patient verbalized understanding. After Visit Summary given.   SUBJECTIVE:  Patty Valdez is a 33 y.o. female who reports gradual onset of left upper dental pain described as aching/throbbing; some hot/cold sensitivity. Present for several days. Fever: absent. Tolerating PO intake but reports pain with chewing. Normal swallowing. She does not see a dentist regularly. No neck swelling or pain. OTC analgesics without relief. Finished course of amox a week ago for similar pain; "kind of helped but it came back".  ROS: As per HPI. All other systems negative.   OBJECTIVE: Vitals:   04/16/19 2015 04/16/19 2017  BP: 119/81   Pulse: 98   Resp: 18   Temp: 98.4 F (36.9 C)   TempSrc: Oral   SpO2: 98%   Weight:  73.5 kg    General appearance: alert; no distress HENT: normocephalic; atraumatic; dentition: fair; left upper gums without areas of fluctuance, drainage, or bleeding and with tenderness to palpation; normal jaw movement without difficulty Neck: supple without LAD; FROM; trachea midline Lungs: normal respirations; unlabored Skin: warm and dry Psychological: alert and cooperative; normal mood and affect  No Known Allergies  Past Medical History:  Diagnosis Date  . Abdominal pain   . Anemia 10 yrs  ago  . Anxiety   . Complication of anesthesia 04-08-2009   low blood pressure after epidural, had to take iv meds for  . Gallstones   . Genital herpes    no current meds for  . Hepatitis C   . Migraine   . Nausea   . Obesity   . Vaginal bleeding    Social History   Socioeconomic History  . Marital status: Married    Spouse name: Not on file  . Number of children: Not on file  . Years of education: Not on file  . Highest education level: Not on file  Occupational History  . Not on file  Social Needs  . Financial resource strain: Not on file  . Food insecurity:    Worry: Not on file    Inability: Not on file  . Transportation needs:    Medical: Not on file    Non-medical: Not on file  Tobacco Use  . Smoking status: Current Every Day Smoker    Packs/day: 0.50    Years: 10.00    Pack years: 5.00    Types: Cigarettes  . Smokeless tobacco: Never Used  Substance and Sexual Activity  . Alcohol use: No    Comment: last drink was 1 yr ago  . Drug use: Yes    Frequency: 2.0 times per week    Types: Marijuana  . Sexual activity: Yes    Birth control/protection: I.U.D.  Lifestyle  . Physical activity:    Days per week: Not on file  Minutes per session: Not on file  . Stress: Not on file  Relationships  . Social connections:    Talks on phone: Not on file    Gets together: Not on file    Attends religious service: Not on file    Active member of club or organization: Not on file    Attends meetings of clubs or organizations: Not on file    Relationship status: Not on file  . Intimate partner violence:    Fear of current or ex partner: Not on file    Emotionally abused: Not on file    Physically abused: Not on file    Forced sexual activity: Not on file  Other Topics Concern  . Not on file  Social History Narrative  . Not on file   Family History  Problem Relation Age of Onset  . Cancer Maternal Grandfather        prostate  . Hypertension Mother   .  Diabetes Mother   . Neuropathy Mother    Past Surgical History:  Procedure Laterality Date  . CESAREAN SECTION N/A 05/15/2015   Procedure: CESAREAN SECTION;  Surgeon: Kathreen CosierBernard A Marshall, MD;  Location: [facility];  Service: Obstetrics;  Laterality: N/A;  . CHOLECYSTECTOMY    . I&D EXTREMITY Left 04/02/2017   Procedure: IRRIGATION AND DEBRIDEMENT EXTREMITY;  Surgeon: Knute Neuoley, Harrill, MD;  Location: [facility] OR;  Service: Plastics;  Laterality: Left;  . NO PAST SURGERIES       Mardella LaymanHagler, Brooklee Michelin, MD 04/21/19 [id number]    Mardella LaymanHagler, Evalisse Prajapati, MD 04/21/19 [phone]

## 2019-04-21 NOTE — Telephone Encounter (Signed)
Patient called and notified of appointments scheduled on 04/28/2019 and 05/04/2019.  Mychart message sent to patient with details of appointments as patient requested.  Patient verbalized understanding.

## 2019-04-21 NOTE — Progress Notes (Signed)
     Virtual Visit via Telephone Note I connected with Patty Valdez on 04/21/19 at  8:30 AM EDT by telephone and verified that I am speaking with the correct person using two identifiers.  Location: [name] Provider: Clovis Pu, RN   I discussed the limitations, risks, security and privacy concerns of performing an evaluation and management service by telephone and the availability of in person appointments. I also discussed with the patient that there may be a patient responsible charge related to this service. The patient expressed understanding and agreed to proceed.   History of Present Illness: PRENATAL INTAKE SUMMARY  Patty Valdez presents today New OB Nurse Interview.  OB History    Gravida  4   Para  3   Term  3   Preterm      AB      Living  3     SAB      TAB      Ectopic      Multiple  0   Live Births  3          I have reviewed the patient's medical, obstetrical, social, and family histories, medications, and available lab results.  SUBJECTIVE She has no unusual complaints. Patient has history of Hep C without treatment. Last HSV outbreak per pt 3-4 months ago. Pt is not taking any medication to prevent recurrence of HSV. Patient has extensive history. Current everyday cigarette smoker; about 1/5 pack per day.    Observations/Objective: Initial nurse interview for history (New OB). Late to care. Patient stated she did not know she was pregnant.  EDD: 07/17/2019 by u/s from [facility]  [phone] GA:[redacted]w[redacted]d FHT: not assessed, non-face to face visit  GENERAL APPEARANCE:  non-face to face visit.  Assessment and Plan: Normal pregnancy Prenatal care-[facility] All lab work will be at next visit with Midwife/MD/NP. Continue PNV U/S MFM detailed +14 weeks ordered   Follow Up Instructions: I discussed the assessment and treatment plan with the patient. The patient was provided an opportunity to ask questions and all  were answered. The patient agreed with the plan and demonstrated an understanding of the instructions.   The patient was advised to call back or seek an in-person evaluation if the symptoms worsen or if the condition fails to improve as anticipated.  I provided 15 minutes of non-face-to-face time during this encounter.   Clovis Pu, RN

## 2019-04-23 ENCOUNTER — Encounter: Payer: Self-pay | Admitting: General Practice

## 2019-04-28 ENCOUNTER — Other Ambulatory Visit (HOSPITAL_COMMUNITY): Payer: Self-pay | Admitting: *Deleted

## 2019-04-28 ENCOUNTER — Other Ambulatory Visit: Payer: Self-pay

## 2019-04-28 ENCOUNTER — Encounter (HOSPITAL_COMMUNITY): Payer: Self-pay | Admitting: *Deleted

## 2019-04-28 ENCOUNTER — Ambulatory Visit (HOSPITAL_COMMUNITY): Payer: Medicaid Other | Admitting: *Deleted

## 2019-04-28 ENCOUNTER — Ambulatory Visit (HOSPITAL_COMMUNITY)
Admission: RE | Admit: 2019-04-28 | Discharge: 2019-04-28 | Disposition: A | Discharge status: Home / Self Care | Payer: Medicaid Other | Source: Ambulatory Visit | Attending: Obstetrics and Gynecology | Admitting: Obstetrics and Gynecology

## 2019-04-28 DIAGNOSIS — O99333 Smoking (tobacco) complicating pregnancy, third trimester: Secondary | ICD-10-CM

## 2019-04-28 DIAGNOSIS — O98413 Viral hepatitis complicating pregnancy, third trimester: Secondary | ICD-10-CM | POA: Diagnosis not present

## 2019-04-28 DIAGNOSIS — O0990 Supervision of high risk pregnancy, unspecified, unspecified trimester: Secondary | ICD-10-CM

## 2019-04-28 DIAGNOSIS — B182 Chronic viral hepatitis C: Secondary | ICD-10-CM

## 2019-04-28 DIAGNOSIS — O0933 Supervision of pregnancy with insufficient antenatal care, third trimester: Secondary | ICD-10-CM

## 2019-04-28 DIAGNOSIS — Z363 Encounter for antenatal screening for malformations: Secondary | ICD-10-CM

## 2019-04-28 DIAGNOSIS — Z362 Encounter for other antenatal screening follow-up: Secondary | ICD-10-CM

## 2019-04-28 DIAGNOSIS — Z3A28 28 weeks gestation of pregnancy: Secondary | ICD-10-CM

## 2019-05-04 ENCOUNTER — Encounter: Payer: Medicaid Other | Admitting: Obstetrics and Gynecology

## 2019-05-04 ENCOUNTER — Other Ambulatory Visit: Payer: Medicaid Other

## 2019-05-13 ENCOUNTER — Encounter: Payer: Medicaid Other | Admitting: Obstetrics & Gynecology

## 2019-05-13 ENCOUNTER — Other Ambulatory Visit: Payer: Medicaid Other

## 2019-05-26 ENCOUNTER — Other Ambulatory Visit: Payer: Medicaid Other

## 2019-05-26 ENCOUNTER — Encounter: Payer: Medicaid Other | Admitting: Obstetrics & Gynecology

## 2019-05-26 ENCOUNTER — Ambulatory Visit (HOSPITAL_COMMUNITY): Payer: Medicaid Other | Attending: Obstetrics and Gynecology

## 2019-05-26 ENCOUNTER — Encounter (HOSPITAL_COMMUNITY): Payer: Self-pay

## 2019-05-26 ENCOUNTER — Ambulatory Visit (HOSPITAL_COMMUNITY): Payer: Medicaid Other

## 2019-06-04 ENCOUNTER — Ambulatory Visit (HOSPITAL_COMMUNITY)
Admission: RE | Admit: 2019-06-04 | Discharge: 2019-06-04 | Disposition: A | Discharge status: Home / Self Care | Payer: Medicaid Other | Source: Ambulatory Visit | Attending: Obstetrics and Gynecology | Admitting: Obstetrics and Gynecology

## 2019-06-12 ENCOUNTER — Ambulatory Visit (HOSPITAL_COMMUNITY): Payer: Medicaid Other | Admitting: *Deleted

## 2019-06-12 ENCOUNTER — Ambulatory Visit (HOSPITAL_COMMUNITY)
Admission: RE | Admit: 2019-06-12 | Discharge: 2019-06-12 | Disposition: A | Discharge status: Home / Self Care | Payer: Medicaid Other | Source: Ambulatory Visit | Attending: Obstetrics and Gynecology | Admitting: Obstetrics and Gynecology

## 2019-06-12 ENCOUNTER — Other Ambulatory Visit: Payer: Self-pay

## 2019-06-12 ENCOUNTER — Encounter (HOSPITAL_COMMUNITY): Payer: Self-pay

## 2019-06-12 DIAGNOSIS — B182 Chronic viral hepatitis C: Secondary | ICD-10-CM | POA: Diagnosis not present

## 2019-06-12 DIAGNOSIS — Z362 Encounter for other antenatal screening follow-up: Secondary | ICD-10-CM | POA: Diagnosis present

## 2019-06-12 DIAGNOSIS — O98419 Viral hepatitis complicating pregnancy, unspecified trimester: Secondary | ICD-10-CM | POA: Diagnosis not present

## 2019-06-12 DIAGNOSIS — O0990 Supervision of high risk pregnancy, unspecified, unspecified trimester: Secondary | ICD-10-CM

## 2019-06-12 DIAGNOSIS — O99333 Smoking (tobacco) complicating pregnancy, third trimester: Secondary | ICD-10-CM

## 2019-06-12 DIAGNOSIS — O0933 Supervision of pregnancy with insufficient antenatal care, third trimester: Secondary | ICD-10-CM

## 2019-06-12 DIAGNOSIS — Z3A35 35 weeks gestation of pregnancy: Secondary | ICD-10-CM

## 2019-06-12 DIAGNOSIS — O34219 Maternal care for unspecified type scar from previous cesarean delivery: Secondary | ICD-10-CM

## 2019-06-15 ENCOUNTER — Other Ambulatory Visit: Payer: Self-pay

## 2019-06-15 ENCOUNTER — Ambulatory Visit (INDEPENDENT_AMBULATORY_CARE_PROVIDER_SITE_OTHER): Payer: Medicaid Other | Admitting: Women's Health

## 2019-06-15 ENCOUNTER — Other Ambulatory Visit (HOSPITAL_COMMUNITY)
Admission: RE | Admit: 2019-06-15 | Discharge: 2019-06-15 | Disposition: A | Discharge status: Home / Self Care | Payer: Medicaid Other | Source: Ambulatory Visit | Attending: Women's Health | Admitting: Women's Health

## 2019-06-15 VITALS — BP 131/90 | HR 92 | Wt 158.0 lb

## 2019-06-15 DIAGNOSIS — Z3481 Encounter for supervision of other normal pregnancy, first trimester: Secondary | ICD-10-CM | POA: Diagnosis not present

## 2019-06-15 DIAGNOSIS — O99343 Other mental disorders complicating pregnancy, third trimester: Secondary | ICD-10-CM

## 2019-06-15 DIAGNOSIS — O98313 Other infections with a predominantly sexual mode of transmission complicating pregnancy, third trimester: Secondary | ICD-10-CM | POA: Diagnosis not present

## 2019-06-15 DIAGNOSIS — O98413 Viral hepatitis complicating pregnancy, third trimester: Secondary | ICD-10-CM

## 2019-06-15 DIAGNOSIS — O0993 Supervision of high risk pregnancy, unspecified, third trimester: Secondary | ICD-10-CM | POA: Diagnosis not present

## 2019-06-15 DIAGNOSIS — F411 Generalized anxiety disorder: Secondary | ICD-10-CM

## 2019-06-15 DIAGNOSIS — B182 Chronic viral hepatitis C: Secondary | ICD-10-CM | POA: Diagnosis not present

## 2019-06-15 DIAGNOSIS — Z3A35 35 weeks gestation of pregnancy: Secondary | ICD-10-CM

## 2019-06-15 DIAGNOSIS — A6009 Herpesviral infection of other urogenital tract: Secondary | ICD-10-CM

## 2019-06-15 DIAGNOSIS — O98813 Other maternal infectious and parasitic diseases complicating pregnancy, third trimester: Secondary | ICD-10-CM

## 2019-06-15 DIAGNOSIS — O0990 Supervision of high risk pregnancy, unspecified, unspecified trimester: Secondary | ICD-10-CM

## 2019-06-15 MED ORDER — VALACYCLOVIR HCL 500 MG PO TABS: 500.0000 mg | ORAL_TABLET | Freq: Two times a day (BID) | ORAL | 1 refills | Status: AC

## 2019-06-15 MED ORDER — CITRANATAL ASSURE 35-1 & 300 MG PO MISC: 1.0000 | Freq: Every day | ORAL | 11 refills | Status: AC

## 2019-06-15 NOTE — Patient Instructions (Addendum)
Preterm Labor and Birth Information ° °The normal length of a pregnancy is 39-41 weeks. Preterm labor is when labor starts before 37 completed weeks of pregnancy. °What are the risk factors for preterm labor? °Preterm labor is more likely to occur in women who: °· Have certain infections during pregnancy such as a bladder infection, sexually transmitted infection, or infection inside the uterus (chorioamnionitis). °· Have a shorter-than-normal cervix. °· Have gone into preterm labor before. °· Have had surgery on their cervix. °· Are younger than age 17 or older than age 35. °· Are African American. °· Are pregnant with twins or multiple babies (multiple gestation). °· Take street drugs or smoke while pregnant. °· Do not gain enough weight while pregnant. °· Became pregnant shortly after having been pregnant. °What are the symptoms of preterm labor? °Symptoms of preterm labor include: °· Cramps similar to those that can happen during a menstrual period. The cramps may happen with diarrhea. °· Pain in the abdomen or lower back. °· Regular uterine contractions that may feel like tightening of the abdomen. °· A feeling of increased pressure in the pelvis. °· Increased watery or bloody mucus discharge from the vagina. °· Water breaking (ruptured amniotic sac). °Why is it important to recognize signs of preterm labor? °It is important to recognize signs of preterm labor because babies who are born prematurely may not be fully developed. This can put them at an increased risk for: °· Long-term (chronic) heart and lung problems. °· Difficulty immediately after birth with regulating body systems, including blood sugar, body temperature, heart rate, and breathing rate. °· Bleeding in the brain. °· Cerebral palsy. °· Learning difficulties. °· Death. °These risks are highest for babies who are born before 34 weeks of pregnancy. °How is preterm labor treated? °Treatment depends on the length of your pregnancy, your condition,  and the health of your baby. It may involve: °· Having a stitch (suture) placed in your cervix to prevent your cervix from opening too early (cerclage). °· Taking or being given medicines, such as: °? Hormone medicines. These may be given early in pregnancy to help support the pregnancy. °? Medicine to stop contractions. °? Medicines to help mature the baby’s lungs. These may be prescribed if the risk of delivery is high. °? Medicines to prevent your baby from developing cerebral palsy. °If the labor happens before 34 weeks of pregnancy, you may need to stay in the hospital. °What should I do if I think I am in preterm labor? °If you think that you are going into preterm labor, call your health care provider right away. °How can I prevent preterm labor in future pregnancies? °To increase your chance of having a full-term pregnancy: °· Do not use any tobacco products, such as cigarettes, chewing tobacco, and e-cigarettes. If you need help quitting, ask your health care provider. °· Do not use street drugs or medicines that have not been prescribed to you during your pregnancy. °· Talk with your health care provider before taking any herbal supplements, even if you have been taking them regularly. °· Make sure you gain a healthy amount of weight during your pregnancy. °· Watch for infection. If you think that you might have an infection, get it checked right away. °· Make sure to tell your health care provider if you have gone into preterm labor before. °This information is not intended to replace advice given to you by your health care provider. Make sure you discuss any questions you have with your   health care provider. Document Released: 01/26/2004 Document Revised: 02/27/2019 Document Reviewed: 03/28/2016 Elsevier Patient Education  2020 Elsevier Inc.   AREA PEDIATRIC/FAMILY PRACTICE PHYSICIANS  [facility] [address] [place], [place] [address] Phone - [phone]   Fax -  [phone]  JACK AMOS [address] [place], [place]  [address] Phone - [phone]   Fax - [phone]  [facility] [address] [place], [place]  [address] Phone - [phone]   Fax - [phone]  [facility] [address] [place], [place]  [address] Phone - [phone]   Fax - [phone]  [facility] [address] [place], [place]  [address] Phone - [phone]   Fax - [phone]  [facility] [address], [address] [place], [place]  [address] Phone - [phone]   Fax - [phone]  [facility] [place] [address], [address] [place], [place]  [address] Phone - [phone]   Fax - [phone]  [facility] [address] [place], [place]  [address] Phone - [phone]   Fax - [phone]  [facility] [address] [place], [place]  [address] Phone - [phone]   Fax - [phone] [facility] [place] [address] [place], [place]  [address] Phone - [phone]   Fax - [phone]  [facility] [address] [place], [place]  [address] Phone - [phone]   Fax - [phone]  [facility] [address], [address] [place], [place]  [address] Phone - [phone]   Fax - [phone]  [facility] [address] [place], [place]  [address] Phone - [phone]   Fax - [phone]  [facility] [address], [address] [place] [place], [place]  [address] Phone - [phone]  [facility] [address] [place], [place]  [address] Phone - [phone]   Fax - [phone]  [facility] [address], [address] [place], [place]  [address] Phone - [phone]   Fax - [phone]  [facility] [address] [place], [place]  [address]  Phone - [phone]   Fax - [phone]  [facility] [address] [place], [place]  [address] Phone - [phone]   Fax - [phone]   [facility] [address] [place], [place]  [address] Phone - [phone]   Fax - [phone]  [facility] [address], [address] [place], [place]  [address] Phone - [phone]   Fax - [phone]  DAVID RUBIN [address] [place], [place]  [address] Phone - [phone]   Fax - [phone]  [facility] [address] [place], [place]  [address] Phone - [phone]   Fax - [phone]  [facility] [address] , [place]  [address] Phone - [phone]   Fax - [phone] [facility] [place] [address] [place], [place]  [address] Phone - [phone]   Fax - [phone]  [facility] [address] [place], [place]  [address] Phone - [phone]   Fax - [phone]  [facility] [place] [address] [place], [place]  [address] Phone - [phone]   Fax - [phone]  Safe Medications in Pregnancy    Acne: Benzoyl Peroxide Salicylic Acid  Backache/Headache: Tylenol: 2 regular strength every 4 hours OR              2 Extra strength every 6 hours  Colds/Coughs/Allergies: Benadryl (alcohol free) 25 mg every 6 hours as needed Breath right strips Claritin Cepacol throat lozenges Chloraseptic throat spray Cold-Eeze- up to three times per day Cough drops, alcohol free Flonase (by prescription only) Guaifenesin Mucinex Robitussin DM (plain only, alcohol free) Saline nasal spray/drops Sudafed (pseudoephedrine) & Actifed ** use only after [redacted] weeks gestation and if you do not have high blood pressure Tylenol Vicks Vaporub Zinc lozenges Zyrtec   Constipation: Colace Ducolax suppositories Fleet enema Glycerin  suppositories Metamucil Milk of magnesia Miralax Senokot Smooth move tea  Diarrhea: Kaopectate Imodium A-D  *NO pepto Bismol  Hemorrhoids: Anusol Anusol HC Preparation H Tucks  Indigestion: Tums Maalox Mylanta Zantac  Pepcid  Insomnia: Benadryl (alcohol free) 25mg  every 6 hours as needed Tylenol PM Unisom, no Gelcaps  Leg Cramps: Tums MagGel  Nausea/Vomiting:  Bonine Dramamine Emetrol Ginger extract Sea bands Meclizine  Nausea medication to take during pregnancy:  Unisom (doxylamine succinate 25 mg tablets) Take one tablet daily at bedtime. If symptoms are not adequately controlled, the dose can be increased to a maximum recommended dose of two tablets daily (1/2 tablet in the morning, 1/2 tablet mid-afternoon and one at bedtime). Vitamin B6 100mg  tablets. Take one tablet twice a day (up to 200 mg per day).  Skin Rashes: Aveeno products Benadryl cream or 25mg  every 6 hours as needed Calamine Lotion 1% cortisone cream  Yeast infection: Gyne-lotrimin 7 Monistat 7   **If taking multiple medications, please check labels to avoid duplicating the same active ingredients **take medication as directed on the label ** Do not exceed 4000 mg of tylenol in 24 hours **Do not take medications that contain aspirin or ibuprofen

## 2019-06-15 NOTE — Progress Notes (Signed)
Pt has concerns about trying to VBAC. Pt is concerned about Hep C and how it will effect delivery and baby. Pt is unsure about Tdap today.

## 2019-06-15 NOTE — Progress Notes (Signed)
History:   Patty Valdez is a 33 y.o. G4P3003 at 67w3dby 26 week [place] 04/16/2019 being seen today for her first obstetrical visit.  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 Patient does intend to breast feed d/t HepC status. Pregnancy history fully reviewed and patient reports all pregnancies have been normal with the exception of her C/S with her last pregnancy. Op Note on 05/15/2015 notes LTCS without complication. Pt states she is extremely anxious regarding a repeat C/S and would like to discuss VBAC risks/benefits. Pt states she recently went for an [place] was told by the technician that is she had a VBAC her uterus would rupture and she would die and while patient wants to have a VBAC, she is now very anxious about it. Pt states having a C/S was very traumatic for her.  Patient is late to care.  Patient reports severe anxiety regarding pregnancy and impending delivery, which is why she reports she waited so long to get into care - pt reports that coming in to this appointment made the pregnancy "very real for her" and she is anxious about the short amount of time between this appointment and the delivery of her baby..Marland Kitchen     HISTORY: OB History  Gravida Para Term Preterm AB Living  '4 3 3 ' 0 0 3  SAB TAB Ectopic Multiple Live Births  0 0 0 0 3    # Outcome Date GA Lbr Len/2nd Weight Sex Delivery Anes PTL Lv  4 Current           3 Term 05/15/15 479w0d6 lb 14 oz (3.118 kg) M CS-LTranv Spinal N LIV     Complications: Herpes     Apgar1: 9  Apgar5: 9  2 Term 04/08/09 408w0d lb (3.629 kg) M Vag-Spont   LIV  1 Term 01/27/08 40w76w0dlb 11 oz (3.487 kg) M Vag-Spont   LIV    Last pap smear was done today and patient reports all  Paps in the past have been normal with the exception of one which required a colposcopy without additional procedures. Pt unsure of when her last pap was performed.  Past Medical History:  Diagnosis Date  . Abdominal pain   . Anemia 10 yrs ago  . Anxiety   . Chronic cholecystitis with calculus 01/08/2012  . Complication of anesthesia 04-08-2009   low blood pressure after epidural, had to take iv meds for  . Gallstones   . Genital herpes    no current meds for  . Hepatitis C   . Migraine   . Nausea   . Obesity   . Vaginal bleeding    Past Surgical History:  Procedure Laterality Date  . CESAREAN SECTION N/A 05/15/2015   Procedure: CESAREAN SECTION;  Surgeon: BernFrederico Hamman;  [facility] [place];  Service: Obstetrics;  Laterality: N/A;  . CHOLECYSTECTOMY    . I&D EXTREMITY Left 04/02/2017   Procedure: IRRIGATION AND DEBRIDEMENT EXTREMITY;  Surgeon: ColeDayna Barker;  [facility];  Laterality: Left;   Family History  Problem Relation Age of Onset  . Cancer Maternal Grandfather        prostate  . Hypertension Mother   .  Diabetes Mother   . Neuropathy Mother    Social History   Tobacco Use  . Smoking status: Current Every Day Smoker    Packs/day: 0.50    Years: 10.00    Pack years: 5.00    Types: Cigarettes  . Smokeless tobacco: Never Used  . Tobacco comment: 6 cigarettes per day  Substance Use Topics  . Alcohol use: No    Comment: last drink was 1 yr ago  . Drug use: Not Currently    Frequency: 2.0 times per week    Types: Marijuana   No Known Allergies Current Outpatient Medications on File Prior to Visit  Medication Sig Dispense Refill  . buprenorphine (SUBUTEX) 2 MG SUBL SL tablet Place 8 mg under the tongue 3 (three) times daily. Pt taking 8 mg po tid    . Prenat w/o A-FeCbGl-DSS-FA-DHA (CITRANATAL ASSURE PO) Take 2 tablets by mouth daily. Combo pack    . [DISCONTINUED] SUBOXONE 8-2 MG FILM Take 1 each by mouth 3 (three) times daily.   0   No current facility-administered medications on file prior to visit.     Review of Systems Pertinent items noted in HPI and remainder of comprehensive ROS otherwise negative. Physical Exam:   Vitals:   06/15/19 1003  BP: 131/90  Pulse: 92  Weight: 158 lb (71.7 kg)   Fetal Heart Rate (bpm): 130 Uterus:  Fundal Height: 32 cm  Pelvic Exam: Perineum: no hemorrhoids, normal perineum   Vulva: normal external genitalia, no lesions   Vagina:  normal mucosa, normal discharge   Cervix: no lesions and normal, pap smear done. Some friability noted when performing Pap.   Adnexa: Difficult to evaluate d/t advanced gestational age   Bony Pelvis: average  System: General: well-developed, well-nourished female in no acute distress   Breasts:  normal appearance, no masses or tenderness bilaterally   Skin: normal coloration and turgor, no rashes   Neurologic: oriented, normal, negative, normal mood   Extremities: normal strength, tone, and muscle mass, ROM of all joints is normal   HEENT PERRLA, extraocular movement intact and sclera clear, anicteric   Mouth/Teeth mucous membranes moist, pharynx normal without lesions and dental hygiene good   Neck supple and no masses   Cardiovascular: regular rate and rhythm   Respiratory:  no respiratory distress, normal breath sounds   Abdomen: soft, non-tender; bowel sounds normal; no masses,  no organomegaly   FHR obtained by RN. CE: dilated to 1cm   Assessment:    Pregnancy: [id number] Patient Active Problem List   Diagnosis Date Noted  . Chronic hepatitis C affecting antepartum care of mother in third trimester ([place]) 06/15/2019  . Pain management   . Hypokalemia   . GERD (gastroesophageal reflux disease) 04/01/2017  . Elevated liver enzymes 04/01/2017  . MDD (major depressive disorder), recurrent severe, without psychosis ([place]) 06/11/2015  . Supervision of high risk pregnancy, antepartum 05/15/2015  . S/P cesarean section 05/15/2015  . Drug  overdose 06/11/2014  . Hypotension due to drugs 06/11/2014  . Suicide attempt by drug ingestion ([place]) 06/11/2014  . Poisoning by carbamazepine 06/11/2014  . IRREGULAR MENSES 08/11/2009  . OBESITY, UNSPECIFIED 07/15/2009  . Anxiety state 07/15/2009  . Genital herpes affecting pregnancy 04/20/2008  . NICOTINE ADDICTION 04/13/2008     Plan:    1. Supervision of high risk pregnancy, antepartum -preeclampsia labs drawn d/t systolic BP of 90 -pt considering Mirena IUD, likely not interested in BTL -will consider Tdap, discussed with patient that if she  desires vaccine she should get it as soon as possible for maximum effectiveness and discussed that normal administration of vaccine is around 28weeks -discussed possibility of NICU admission for baby secondary to Subutex use and neonatal withdraw syndrome -list of pediatric providers given -list of safe meds in pregnancy given - Prenat w/o A-FeCbGl-DSS-FA-DHA (CITRANATAL ASSURE) 35-1 & 300 MG tablet; Take 1 tablet by mouth daily.  Dispense: 30 each; Refill: 11 - Culture, beta strep (group b only) - Urine drugs of abuse scrn w alc, routine ([facility]) - [place] MFM OB FOLLOW UP; Future - Protein / creatinine ratio, urine - Cytology - PAP( [place]) - Cervicovaginal ancillary only( [place]) - Culture, OB Urine - Obstetric Panel, Including HIV - Glucose tolerance, 1 hour  2. Genital herpes affecting pregnancy in third trimester -pt advised to start Valtrex today to avoid outbreak at time of delivery and increase chances for a successful VBAC -discussed link between pregnancy, stress, smoking and HSV outbreaks and patient encouraged to stop smoking or reduce number of cigarettes per day - valACYclovir (VALTREX) 500 MG tablet; Take 1 tablet (500 mg total) by mouth 2 (two) times daily.  Dispense: 60 tablet; Refill: 1  3. Chronic hepatitis C affecting antepartum care of mother in third trimester ([facility]) - HCV RNA quant  - Comp Met ([facility]) - [place] MFM OB FOLLOW UP; Future  4. Anxiety state -pt offered referral to behavioral health, pt declines stating she already has a counselor named Diamond Nickel from [facility] that she is mandated to see once per month, but often sees her more frequently -pt advised to establish weekly sessions with Ms. Wilson and discussed that patient is at higher risk of PP depression due to history and given that patient seems to be having increasing amounts of anxiety due to upcoming delivery and OB history  Initial labs drawn, Pap, GBS performed. Continue prenatal vitamins. Ultrasound discussed; fetal anatomic survey: normal, but limited d/t gestational age. Normal interval growth noted on [place] 06/12/2019. Problem list reviewed and updated. The nature of [facility] with multiple MDs and other Advanced Practice Providers was explained to patient; also emphasized that residents, students are part of our team. Routine obstetric precautions reviewed. Return in about 1 week (around 06/22/2019) for in-person, WITH MD ONLY, pt needs [place] scheduled for 3rd week in  August, order entered.  Pt wishes to discuss VBAC at that time.  Clarisa Fling, NP  9:27 AM 06/17/2019

## 2019-06-17 ENCOUNTER — Encounter: Payer: Self-pay | Admitting: Women's Health

## 2019-06-17 LAB — CYTOLOGY - PAP
Diagnosis: NEGATIVE
HPV: NOT DETECTED

## 2019-06-17 LAB — CERVICOVAGINAL ANCILLARY ONLY
Chlamydia: NEGATIVE
Neisseria Gonorrhea: NEGATIVE

## 2019-06-18 LAB — COMPREHENSIVE METABOLIC PANEL
ALT: 10 IU/L (ref 0–32)
AST: 18 IU/L (ref 0–40)
Albumin/Globulin Ratio: 1 — ABNORMAL LOW (ref 1.2–2.2)
Albumin: 3.3 g/dL — ABNORMAL LOW (ref 3.8–4.8)
Alkaline Phosphatase: 180 IU/L — ABNORMAL HIGH (ref 39–117)
BUN/Creatinine Ratio: 6 — ABNORMAL LOW (ref 9–23)
BUN: 3 mg/dL — ABNORMAL LOW (ref 6–20)
Bilirubin Total: 0.3 mg/dL (ref 0.0–1.2)
CO2: 19 mmol/L — ABNORMAL LOW (ref 20–29)
Calcium: 8.2 mg/dL — ABNORMAL LOW (ref 8.7–10.2)
Chloride: 102 mmol/L (ref 96–106)
Creatinine, Ser: 0.47 mg/dL — ABNORMAL LOW (ref 0.57–1.00)
GFR calc Af Amer: 150 mL/min/{1.73_m2} (ref >59)
GFR calc non Af Amer: 130 mL/min/{1.73_m2} (ref >59)
Globulin, Total: 3.4 g/dL (ref 1.5–4.5)
Glucose: 108 mg/dL — ABNORMAL HIGH (ref 65–99)
Potassium: 3.5 mmol/L (ref 3.5–5.2)
Sodium: 136 mmol/L (ref 134–144)
Total Protein: 6.7 g/dL (ref 6.0–8.5)

## 2019-06-18 LAB — OBSTETRIC PANEL, INCLUDING HIV
Antibody Screen: NEGATIVE
Basophils Absolute: 0 10*3/uL (ref 0.0–0.2)
Basos: 1 %
EOS (ABSOLUTE): 0 10*3/uL (ref 0.0–0.4)
Eos: 1 %
HIV Screen 4th Generation wRfx: NONREACTIVE
Hematocrit: 31.3 % — ABNORMAL LOW (ref 34.0–46.6)
Hemoglobin: 10.6 g/dL — ABNORMAL LOW (ref 11.1–15.9)
Hepatitis B Surface Ag: NEGATIVE
Immature Grans (Abs): 0 10*3/uL (ref 0.0–0.1)
Immature Granulocytes: 1 %
Lymphocytes Absolute: 1.3 10*3/uL (ref 0.7–3.1)
Lymphs: 19 %
MCH: 30.6 pg (ref 26.6–33.0)
MCHC: 33.9 g/dL (ref 31.5–35.7)
MCV: 91 fL (ref 79–97)
Monocytes Absolute: 0.4 10*3/uL (ref 0.1–0.9)
Monocytes: 6 %
Neutrophils Absolute: 4.8 10*3/uL (ref 1.4–7.0)
Neutrophils: 72 %
Platelets: 262 10*3/uL (ref 150–450)
RBC: 3.46 x10E6/uL — ABNORMAL LOW (ref 3.77–5.28)
RDW: 12.6 % (ref 11.7–15.4)
RPR Ser Ql: NONREACTIVE
Rh Factor: POSITIVE
Rubella Antibodies, IGG: 4.76 index (ref >0.99)
WBC: 6.5 10*3/uL (ref 3.4–10.8)

## 2019-06-18 LAB — PROTEIN / CREATININE RATIO, URINE
Creatinine, Urine: 116 mg/dL
Protein, Ur: 70 mg/dL
Protein/Creat Ratio: 603 mg/g creat — ABNORMAL HIGH (ref 0–200)

## 2019-06-18 LAB — HCV RNA QUANT
HCV log10: 5.332 log10 IU/mL
Hepatitis C Quantitation: 215000 IU/mL

## 2019-06-18 LAB — CULTURE, BETA STREP (GROUP B ONLY): Strep Gp B Culture: POSITIVE — AB

## 2019-06-18 LAB — GLUCOSE, 1 HOUR GESTATIONAL: Gestational Diabetes Screen: 109 mg/dL (ref 65–139)

## 2019-06-20 LAB — URINE DRUGS OF ABUSE SCREEN W ALC, ROUTINE (REF LAB)
Amphetamines, Urine: NEGATIVE ng/mL
Barbiturate Quant, Ur: NEGATIVE ng/mL
Ethanol, Urine: NEGATIVE %
Methadone Screen, Urine: NEGATIVE ng/mL
PCP Quant, Ur: NEGATIVE ng/mL
Propoxyphene: NEGATIVE ng/mL

## 2019-06-20 LAB — DRUG PROFILE 799031: BENZODIAZEPINES: NEGATIVE

## 2019-06-20 LAB — OPIATES CONFIRMATION, URINE
CODEINE: POSITIVE — AB
Codeine GC/MS Conf: 2032 ng/mL
MORPHINE: POSITIVE — AB
Morphine GC/MS Conf: >3000 ng/mL
OPIATES: POSITIVE — AB

## 2019-06-20 LAB — PANEL 799049
CARBOXY THC GC/MS CONF: 45 ng/mL
Cannabinoid GC/MS, Ur: POSITIVE — AB

## 2019-06-20 LAB — CULTURE, OB URINE

## 2019-06-20 LAB — URINE CULTURE, OB REFLEX

## 2019-06-21 ENCOUNTER — Encounter: Payer: Self-pay | Admitting: Women's Health

## 2019-06-21 ENCOUNTER — Encounter (HOSPITAL_COMMUNITY): Payer: Self-pay | Admitting: *Deleted

## 2019-06-21 ENCOUNTER — Telehealth: Payer: Self-pay | Admitting: Women's Health

## 2019-06-21 ENCOUNTER — Inpatient Hospital Stay (HOSPITAL_COMMUNITY): Payer: Medicaid Other | Admitting: Anesthesiology

## 2019-06-21 ENCOUNTER — Other Ambulatory Visit: Payer: Self-pay

## 2019-06-21 ENCOUNTER — Inpatient Hospital Stay (HOSPITAL_COMMUNITY)
Admission: AD | Admit: 2019-06-21 | Discharge: 2019-06-24 | DRG: 805 | Disposition: A | Discharge status: Home / Self Care | Payer: Medicaid Other | Attending: Obstetrics & Gynecology | Admitting: Obstetrics & Gynecology

## 2019-06-21 DIAGNOSIS — F17210 Nicotine dependence, cigarettes, uncomplicated: Secondary | ICD-10-CM | POA: Diagnosis present

## 2019-06-21 DIAGNOSIS — O9832 Other infections with a predominantly sexual mode of transmission complicating childbirth: Secondary | ICD-10-CM | POA: Diagnosis present

## 2019-06-21 DIAGNOSIS — O99320 Drug use complicating pregnancy, unspecified trimester: Secondary | ICD-10-CM

## 2019-06-21 DIAGNOSIS — O99334 Smoking (tobacco) complicating childbirth: Secondary | ICD-10-CM | POA: Diagnosis present

## 2019-06-21 DIAGNOSIS — R809 Proteinuria, unspecified: Secondary | ICD-10-CM | POA: Insufficient documentation

## 2019-06-21 DIAGNOSIS — F1290 Cannabis use, unspecified, uncomplicated: Secondary | ICD-10-CM | POA: Insufficient documentation

## 2019-06-21 DIAGNOSIS — O322XX0 Maternal care for transverse and oblique lie, not applicable or unspecified: Secondary | ICD-10-CM | POA: Diagnosis present

## 2019-06-21 DIAGNOSIS — O34219 Maternal care for unspecified type scar from previous cesarean delivery: Principal | ICD-10-CM | POA: Diagnosis present

## 2019-06-21 DIAGNOSIS — O0990 Supervision of high risk pregnancy, unspecified, unspecified trimester: Secondary | ICD-10-CM

## 2019-06-21 DIAGNOSIS — D649 Anemia, unspecified: Secondary | ICD-10-CM | POA: Diagnosis present

## 2019-06-21 DIAGNOSIS — O2343 Unspecified infection of urinary tract in pregnancy, third trimester: Secondary | ICD-10-CM

## 2019-06-21 DIAGNOSIS — O9902 Anemia complicating childbirth: Secondary | ICD-10-CM | POA: Diagnosis present

## 2019-06-21 DIAGNOSIS — O99824 Streptococcus B carrier state complicating childbirth: Secondary | ICD-10-CM | POA: Diagnosis present

## 2019-06-21 DIAGNOSIS — O9842 Viral hepatitis complicating childbirth: Secondary | ICD-10-CM | POA: Diagnosis present

## 2019-06-21 DIAGNOSIS — B951 Streptococcus, group B, as the cause of diseases classified elsewhere: Secondary | ICD-10-CM | POA: Insufficient documentation

## 2019-06-21 DIAGNOSIS — A6000 Herpesviral infection of urogenital system, unspecified: Secondary | ICD-10-CM | POA: Diagnosis present

## 2019-06-21 DIAGNOSIS — O99324 Drug use complicating childbirth: Secondary | ICD-10-CM | POA: Diagnosis present

## 2019-06-21 DIAGNOSIS — O368130 Decreased fetal movements, third trimester, not applicable or unspecified: Secondary | ICD-10-CM

## 2019-06-21 DIAGNOSIS — Z20828 Contact with and (suspected) exposure to other viral communicable diseases: Secondary | ICD-10-CM | POA: Diagnosis present

## 2019-06-21 DIAGNOSIS — B182 Chronic viral hepatitis C: Secondary | ICD-10-CM | POA: Diagnosis present

## 2019-06-21 DIAGNOSIS — B1920 Unspecified viral hepatitis C without hepatic coma: Secondary | ICD-10-CM | POA: Diagnosis present

## 2019-06-21 DIAGNOSIS — Z98891 History of uterine scar from previous surgery: Secondary | ICD-10-CM

## 2019-06-21 DIAGNOSIS — F1190 Opioid use, unspecified, uncomplicated: Secondary | ICD-10-CM | POA: Diagnosis present

## 2019-06-21 DIAGNOSIS — O1403 Mild to moderate pre-eclampsia, third trimester: Secondary | ICD-10-CM | POA: Diagnosis present

## 2019-06-21 DIAGNOSIS — O26893 Other specified pregnancy related conditions, third trimester: Secondary | ICD-10-CM | POA: Diagnosis present

## 2019-06-21 DIAGNOSIS — Z3A36 36 weeks gestation of pregnancy: Secondary | ICD-10-CM | POA: Diagnosis not present

## 2019-06-21 DIAGNOSIS — F1120 Opioid dependence, uncomplicated: Secondary | ICD-10-CM

## 2019-06-21 LAB — COMPREHENSIVE METABOLIC PANEL
ALT: 12 U/L (ref 0–44)
AST: 17 U/L (ref 15–41)
Albumin: 2.8 g/dL — ABNORMAL LOW (ref 3.5–5.0)
Alkaline Phosphatase: 193 U/L — ABNORMAL HIGH (ref 38–126)
Anion gap: 15 (ref 5–15)
BUN: <5 mg/dL — ABNORMAL LOW (ref 6–20)
CO2: 19 mmol/L — ABNORMAL LOW (ref 22–32)
Calcium: 9.2 mg/dL (ref 8.9–10.3)
Chloride: 98 mmol/L (ref 98–111)
Creatinine, Ser: 0.59 mg/dL (ref 0.44–1.00)
GFR calc Af Amer: >60 mL/min (ref >60)
GFR calc non Af Amer: >60 mL/min (ref >60)
Glucose, Bld: 68 mg/dL — ABNORMAL LOW (ref 70–99)
Potassium: 3.8 mmol/L (ref 3.5–5.1)
Sodium: 132 mmol/L — ABNORMAL LOW (ref 135–145)
Total Bilirubin: 1.2 mg/dL (ref 0.3–1.2)
Total Protein: 7.6 g/dL (ref 6.5–8.1)

## 2019-06-21 LAB — CBC WITH DIFFERENTIAL/PLATELET
Abs Immature Granulocytes: 0.07 10*3/uL (ref 0.00–0.07)
Basophils Absolute: 0 10*3/uL (ref 0.0–0.1)
Basophils Relative: 0 %
Eosinophils Absolute: 0 10*3/uL (ref 0.0–0.5)
Eosinophils Relative: 0 %
HCT: 34.1 % — ABNORMAL LOW (ref 36.0–46.0)
Hemoglobin: 11.7 g/dL — ABNORMAL LOW (ref 12.0–15.0)
Immature Granulocytes: 1 %
Lymphocytes Relative: 14 %
Lymphs Abs: 1.9 10*3/uL (ref 0.7–4.0)
MCH: 31.3 pg (ref 26.0–34.0)
MCHC: 34.3 g/dL (ref 30.0–36.0)
MCV: 91.2 fL (ref 80.0–100.0)
Monocytes Absolute: 0.6 10*3/uL (ref 0.1–1.0)
Monocytes Relative: 5 %
Neutro Abs: 10.9 10*3/uL — ABNORMAL HIGH (ref 1.7–7.7)
Neutrophils Relative %: 80 %
Platelets: 385 10*3/uL (ref 150–400)
RBC: 3.74 MIL/uL — ABNORMAL LOW (ref 3.87–5.11)
RDW: 12.5 % (ref 11.5–15.5)
WBC: 13.4 10*3/uL — ABNORMAL HIGH (ref 4.0–10.5)
nRBC: 0 % (ref 0.0–0.2)

## 2019-06-21 LAB — TYPE AND SCREEN
ABO/RH(D): O POS
Antibody Screen: NEGATIVE

## 2019-06-21 LAB — URINALYSIS, ROUTINE W REFLEX MICROSCOPIC
Bilirubin Urine: NEGATIVE
Glucose, UA: NEGATIVE mg/dL
Ketones, ur: 80 mg/dL — AB
Nitrite: NEGATIVE
Protein, ur: 100 mg/dL — AB
Specific Gravity, Urine: 1.019 (ref 1.005–1.030)
WBC, UA: >50 WBC/hpf — ABNORMAL HIGH (ref 0–5)
pH: 5 (ref 5.0–8.0)

## 2019-06-21 LAB — PROTEIN / CREATININE RATIO, URINE
Creatinine, Urine: 189.39 mg/dL
Protein Creatinine Ratio: 0.27 mg/mg{Cre} — ABNORMAL HIGH (ref 0.00–0.15)
Total Protein, Urine: 52 mg/dL

## 2019-06-21 LAB — SARS CORONAVIRUS 2 BY RT PCR (HOSPITAL ORDER, PERFORMED IN ~~LOC~~ HOSPITAL LAB): SARS Coronavirus 2: NEGATIVE

## 2019-06-21 MED ORDER — LACTATED RINGERS IV SOLN
INTRAVENOUS | Status: DC
  Administered 2019-06-21 – 2019-06-22 (×2): via INTRAVENOUS

## 2019-06-21 MED ORDER — PHENYLEPHRINE 40 MCG/ML (10ML) SYRINGE FOR IV PUSH (FOR BLOOD PRESSURE SUPPORT): 80.0000 ug | PREFILLED_SYRINGE | INTRAVENOUS | Status: DC | PRN

## 2019-06-21 MED ORDER — SOD CITRATE-CITRIC ACID 500-334 MG/5ML PO SOLN: 30.0000 mL | ORAL | Status: DC | PRN

## 2019-06-21 MED ORDER — FENTANYL-BUPIVACAINE-NACL 0.5-0.125-0.9 MG/250ML-% EP SOLN
12.0000 mL/h | EPIDURAL | Status: DC | PRN
  Filled 2019-06-21: qty 250

## 2019-06-21 MED ORDER — EPHEDRINE 5 MG/ML INJ: 10.0000 mg | INTRAVENOUS | Status: DC | PRN

## 2019-06-21 MED ORDER — BETAMETHASONE SOD PHOS & ACET 6 (3-3) MG/ML IJ SUSP
12.0000 mg | INTRAMUSCULAR | Status: DC
  Administered 2019-06-21: 12 mg via INTRAMUSCULAR
  Filled 2019-06-21: qty 2

## 2019-06-21 MED ORDER — OXYCODONE-ACETAMINOPHEN 5-325 MG PO TABS: 2.0000 | ORAL_TABLET | ORAL | Status: DC | PRN

## 2019-06-21 MED ORDER — FENTANYL CITRATE (PF) 100 MCG/2ML IJ SOLN
100.0000 ug | INTRAMUSCULAR | Status: DC | PRN
  Administered 2019-06-21: 100 ug via INTRAVENOUS
  Filled 2019-06-21: qty 2

## 2019-06-21 MED ORDER — FENTANYL-BUPIVACAINE-NACL 0.5-0.125-0.9 MG/250ML-% EP SOLN: 12.0000 mL/h | EPIDURAL | Status: DC | PRN

## 2019-06-21 MED ORDER — OXYTOCIN BOLUS FROM INFUSION
500.0000 mL | Freq: Once | INTRAVENOUS | Status: AC
  Administered 2019-06-22: 500 mL via INTRAVENOUS

## 2019-06-21 MED ORDER — OXYCODONE-ACETAMINOPHEN 5-325 MG PO TABS: 1.0000 | ORAL_TABLET | ORAL | Status: DC | PRN

## 2019-06-21 MED ORDER — SODIUM CHLORIDE 0.9 % IV SOLN: 5.0000 10*6.[IU] | Freq: Once | INTRAVENOUS | Status: DC

## 2019-06-21 MED ORDER — PROMETHAZINE HCL 25 MG/ML IJ SOLN
25.0000 mg | Freq: Once | INTRAMUSCULAR | Status: AC
  Administered 2019-06-21: 25 mg via INTRAVENOUS
  Filled 2019-06-21: qty 1

## 2019-06-21 MED ORDER — ONDANSETRON HCL 4 MG/2ML IJ SOLN
4.0000 mg | Freq: Four times a day (QID) | INTRAMUSCULAR | Status: DC | PRN
  Administered 2019-06-21: 4 mg via INTRAVENOUS
  Filled 2019-06-21: qty 2

## 2019-06-21 MED ORDER — ACETAMINOPHEN 325 MG PO TABS: 650.0000 mg | ORAL_TABLET | ORAL | Status: DC | PRN

## 2019-06-21 MED ORDER — LACTATED RINGERS IV BOLUS
1000.0000 mL | Freq: Once | INTRAVENOUS | Status: AC
  Administered 2019-06-21: 1000 mL via INTRAVENOUS

## 2019-06-21 MED ORDER — SODIUM CHLORIDE (PF) 0.9 % IJ SOLN
INTRAMUSCULAR | Status: DC | PRN
  Administered 2019-06-21: 12 mL/h via EPIDURAL

## 2019-06-21 MED ORDER — DIPHENHYDRAMINE HCL 50 MG/ML IJ SOLN: 12.5000 mg | INTRAMUSCULAR | Status: DC | PRN

## 2019-06-21 MED ORDER — LACTATED RINGERS IV SOLN: 500.0000 mL | INTRAVENOUS | Status: DC | PRN

## 2019-06-21 MED ORDER — LACTATED RINGERS IV SOLN: 500.0000 mL | Freq: Once | INTRAVENOUS | Status: DC

## 2019-06-21 MED ORDER — SODIUM CHLORIDE 0.9 % IV SOLN
2.0000 g | Freq: Once | INTRAVENOUS | Status: AC
  Administered 2019-06-21: 2 g via INTRAVENOUS
  Filled 2019-06-21: qty 2000

## 2019-06-21 MED ORDER — SODIUM CHLORIDE 0.9 % IV SOLN: 2.0000 g | Freq: Four times a day (QID) | INTRAVENOUS | Status: DC

## 2019-06-21 MED ORDER — BUPRENORPHINE HCL 8 MG SL SUBL
8.0000 mg | SUBLINGUAL_TABLET | Freq: Three times a day (TID) | SUBLINGUAL | Status: DC
  Administered 2019-06-22 – 2019-06-24 (×8): 8 mg via SUBLINGUAL
  Filled 2019-06-21 (×8): qty 1

## 2019-06-21 MED ORDER — PENICILLIN G 3 MILLION UNITS IVPB - SIMPLE MED: 3.0000 10*6.[IU] | INTRAVENOUS | Status: DC

## 2019-06-21 MED ORDER — LIDOCAINE HCL (PF) 1 % IJ SOLN
30.0000 mL | INTRAMUSCULAR | Status: AC | PRN
  Administered 2019-06-22: 30 mL via SUBCUTANEOUS
  Filled 2019-06-21: qty 30

## 2019-06-21 MED ORDER — FLEET ENEMA 7-19 GM/118ML RE ENEM: 1.0000 | ENEMA | RECTAL | Status: DC | PRN

## 2019-06-21 MED ORDER — LIDOCAINE HCL (PF) 1 % IJ SOLN
INTRAMUSCULAR | Status: DC | PRN
  Administered 2019-06-21: 5 mL via EPIDURAL
  Administered 2019-06-21: 4 mL via EPIDURAL

## 2019-06-21 MED ORDER — OXYTOCIN 40 UNITS IN NORMAL SALINE INFUSION - SIMPLE MED
2.5000 [IU]/h | INTRAVENOUS | Status: DC
  Filled 2019-06-21: qty 1000

## 2019-06-21 MED ORDER — FENTANYL CITRATE (PF) 100 MCG/2ML IJ SOLN
50.0000 ug | Freq: Once | INTRAMUSCULAR | Status: AC
  Administered 2019-06-21: 20:00:00 50 ug via INTRAVENOUS
  Filled 2019-06-21: qty 2

## 2019-06-21 NOTE — Anesthesia Procedure Notes (Signed)
Epidural Patient location during procedure: OB Start time: 06/21/2019 10:34 PM End time: 06/21/2019 10:38 PM  Staffing Anesthesiologist: Audry Pili, MD Performed: anesthesiologist   Preanesthetic Checklist Completed: patient identified, pre-op evaluation, timeout performed, IV checked, risks and benefits discussed and monitors and equipment checked  Epidural Patient position: sitting Prep: DuraPrep Patient monitoring: continuous pulse ox and blood pressure Approach: midline Location: L2-L3 Injection technique: LOR saline  Needle:  Needle type: Tuohy  Needle gauge: 17 G Needle length: 9 cm Needle insertion depth: 6 cm Catheter size: 19 Gauge Catheter at skin depth: 11 cm Test dose: negative and Other (1% lidocaine)  Assessment Events: blood not aspirated  Additional Notes Patient identified. Risks including, but not limited to, bleeding, infection, nerve damage, paralysis, inadequate analgesia, blood pressure changes, nausea, vomiting, allergic reaction, postpartum back pain, itching, and headache were discussed. Patient expressed understanding and wished to proceed. Sterile prep and drape, including hand hygiene, mask, and sterile gloves were used. The patient was positioned and the spine was prepped. The skin was anesthetized with lidocaine. No paraesthesia or other complication noted. The patient did not experience any signs of intravascular injection such as tinnitus or metallic taste in mouth, nor signs of intrathecal spread such as rapid motor block. Please see nursing notes for vital signs. The patient tolerated the procedure well.   Renold Don, MDReason for block:procedure for pain

## 2019-06-21 NOTE — Progress Notes (Signed)
OB/GYN Faculty Practice: Labor Progress Note  Subjective: Patient doing well without complaints.  Objective: BP 128/63   Pulse 78   Temp 97.7 F (36.5 C) (Oral)   Resp (!) 21   Ht 5\' 3"  (1.6 m)   Wt 68.9 kg   LMP 02/11/2019   SpO2 100%   BMI 26.91 kg/m  Gen: well appearing, NAD Dilation: 5.5 Effacement (%): 100 Cervical Position: Middle Station: Ballotable Presentation: Vertex Exam by:: Dr. Harolyn Rutherford  FHT: baseline 150bmp, mod variability, +accels, no decels Toco: q2 minutes  Assessment and Plan: 33 y.o. [id number] [redacted]w[redacted]d presents for labor.  Labor: progressing well, will continue to monitor and initiate pit/AROM when able. Desires TOLAC, counseled on risks/benefits as documented in H&P. -- pain control: CSE  Fetal Well-Being: EFW 2640g (55%ile) by 35w U/S. Cephalic by BSUS.  -- Category 1 - continuous fetal monitoring  -- GBS positive  GBS positive --amp 2g (2141)  Hep C --no treatment --HCV RNA quant 215k (06/15/2019)  Drug use --prior heroin use per notes --UDS + cannabinoids (06/15/2019) --patient currently on subutex 8 mg TID, ordered --SW consult post partum  HSV --prior c/s due to active outbreak --recently started on valtrex 500 mg BID --SSE without any lesions --will proceed with TOLAC  Pre-Eclampsia --P/C 0.6 (06/15/2019), all other labs unremarkable --pt asymptomatic --blood pressures well controlled --continue to monitor  Corliss Blacker, PGY-III Family medicine 11:01 PM

## 2019-06-21 NOTE — Anesthesia Preprocedure Evaluation (Signed)
Anesthesia Evaluation  Patient identified by MRN, date of birth, ID band Patient awake    Reviewed: Allergy & Precautions, NPO status , Patient's Chart, lab work & pertinent test results  History of Anesthesia Complications Negative for: history of anesthetic complications  Airway Mallampati: II   Neck ROM: Full    Dental   Pulmonary Current Smoker,    Pulmonary exam normal        Cardiovascular hypertension, Normal cardiovascular exam     Neuro/Psych  Headaches, PSYCHIATRIC DISORDERS Anxiety Depression  Hx suicide attempt    GI/Hepatic GERD  ,(+)     substance abuse  marijuana use and IV drug use, Hepatitis -, C Chronic cholecystitis    Endo/Other  negative endocrine ROS  Renal/GU negative Renal ROS     Musculoskeletal negative musculoskeletal ROS (+)   Abdominal   Peds  Hematology  (+) anemia ,   Anesthesia Other Findings HSV On subutex   Reproductive/Obstetrics (+) Pregnancy  Previous c-section                              Anesthesia Physical Anesthesia Plan  ASA: III  Anesthesia Plan: Epidural   Post-op Pain Management:    Induction:   PONV Risk Score and Plan: 2 and Treatment may vary due to age or medical condition  Airway Management Planned: Natural Airway  Additional Equipment: None  Intra-op Plan:   Post-operative Plan:   Informed Consent: I have reviewed the patients History and Physical, chart, labs and discussed the procedure including the risks, benefits and alternatives for the proposed anesthesia with the patient or authorized representative who has indicated his/her understanding and acceptance.       Plan Discussed with: Anesthesiologist  Anesthesia Plan Comments: (Labs reviewed. Platelets acceptable, patient not taking any blood thinning medications. Per RN, FHR tracing reported to be stable enough for sitting procedure. Risks and benefits  discussed with patient, including PDPH, backache, epidural hematoma, failed epidural, blood pressure changes, allergic reaction, and nerve injury. Patient expressed understanding and wished to proceed.)        Anesthesia Quick Evaluation

## 2019-06-21 NOTE — MAU Provider Note (Addendum)
History     CSN: [id number]  Arrival date and time: 06/21/19 1531   First Provider Initiated Contact with Patient 06/21/19 1632      Chief Complaint  Patient presents with  . Pelvic Pain  . Decreased Fetal Movement   HPI Patty Valdez is a 33 y.o. G4P3003 at 475w2d who presents to MAU as a labor check. She endorses new onset painful contractions early this morning. She rates her pain as 7-8/10. She denies aggravating or alleviating factors. She denies vaginal bleeding, leaking of fluid, decreased fetal movement, fever, falls, or recent illness.   Patient's history includes Term SVD x 2 followed by cesarean section. Patient is anxious and agitated that she may be in labor and has not yet made a decision regarding her route of delivery. She reports that a friend counseled her about uterine rupture and she now feels pressured to make a decision.  Patient denies headache, visual disturbances, right upper quadrant/epigastric pain, new onset swelling or weight gain.   OB History    Gravida  4   Para  3   Term  3   Preterm      AB      Living  3     SAB      TAB      Ectopic      Multiple  0   Live Births  3           Past Medical History:  Diagnosis Date  . Abdominal pain   . Anemia 10 yrs ago  . Anxiety   . Chronic cholecystitis with calculus 01/08/2012  . Complication of anesthesia 04-08-2009   low blood pressure after epidural, had to take iv meds for  . Gallstones   . Genital herpes    no current meds for  . Hepatitis C   . Migraine   . Nausea   . Obesity   . Vaginal bleeding     Past Surgical History:  Procedure Laterality Date  . CESAREAN SECTION N/A 05/15/2015   Procedure: CESAREAN SECTION;  Surgeon: Kathreen CosierBernard A Marshall, MD;  [facility];  Service: Obstetrics;  Laterality: N/A;  . CHOLECYSTECTOMY    . I&D EXTREMITY Left 04/02/2017   Procedure: IRRIGATION AND DEBRIDEMENT EXTREMITY;  Surgeon: Knute Neuoley, [place], MD;  [facility];  Service:  Plastics;  Laterality: Left;    Family History  Problem Relation Age of Onset  . Cancer Maternal Grandfather        prostate  . Hypertension Mother   . Diabetes Mother   . Neuropathy Mother     Social History   Tobacco Use  . Smoking status: Current Every Day Smoker    Packs/day: 0.50    Years: 10.00    Pack years: 5.00    Types: Cigarettes  . Smokeless tobacco: Never Used  . Tobacco comment: 6 cigarettes per day  Substance Use Topics  . Alcohol use: No    Comment: last drink was 1 yr ago  . Drug use: Not Currently    Frequency: 2.0 times per week    Types: Marijuana    Allergies: No Known Allergies  Medications Prior to Admission  Medication Sig Dispense Refill Last Dose  . buprenorphine (SUBUTEX) 2 MG SUBL SL tablet Place 8 mg under the tongue 3 (three) times daily. Pt taking 8 mg po tid   06/21/2019 at Unknown time  . Prenat w/o A-FeCbGl-DSS-FA-DHA (CITRANATAL ASSURE) 35-1 & 300 MG tablet Take 1 tablet by  mouth daily. 30 each 11 06/21/2019 at Unknown time  . valACYclovir (VALTREX) 500 MG tablet Take 1 tablet (500 mg total) by mouth 2 (two) times daily. 60 tablet 1 06/21/2019 at Unknown time  . Prenat w/o A-FeCbGl-DSS-FA-DHA (CITRANATAL ASSURE PO) Take 2 tablets by mouth daily. Combo pack       Review of Systems  Constitutional: Negative for chills, fatigue and fever.  Respiratory: Negative for shortness of breath.   Cardiovascular: Negative for chest pain and leg swelling.  Gastrointestinal: Positive for abdominal pain.  Genitourinary: Positive for pelvic pain. Negative for difficulty urinating, dysuria, flank pain, vaginal bleeding, vaginal discharge and vaginal pain.  Musculoskeletal: Negative for back pain.  Neurological: Negative for headaches.  All other systems reviewed and are negative.  Physical Exam   Blood pressure 113/67, pulse 89, temperature 98.2 F (36.8 C), temperature source Oral, resp. rate 18, height 5\' 3"  (1.6 m), weight 68.9 kg, last menstrual  period 02/11/2019, SpO2 100 %.  Physical Exam  Nursing note and vitals reviewed. Constitutional: She is oriented to person, place, and time. She appears well-developed and well-nourished.  Cardiovascular: Normal rate.  Respiratory: Effort normal.  GI:  Gravid  Neurological: She is alert and oriented to person, place, and time.  Skin: Skin is warm and dry.  Psychiatric: She has a normal mood and affect. Her behavior is normal. Judgment and thought content normal.    MAU Course/MDM  Procedures  --Patient remains 1cm following one hour of observation in MAU --Offered and accepted IV fluid bolus and Phenergan for discomfort --Explained to patient that pelvic pain may also be related to untreated UTI --Elevated blood pressure on arrival in MAU as well as at new ob appt 07/27. Patient now meets criteria for Gestational Hypertension.  Also noted to have elevated P:Cr at new ob.  --Report regarding need for PEC workup given to J. Magnus SinningWenzel, PA who assumes care at this time.  Clayton BiblesSamantha Weinhold, MSN, CNM Certified Nurse Midwife, Faculty Practice 06/21/19 5:34 PM   1734 - Assumed care from Thalia BloodgoodSam Weinhold, CNM. RN having difficulty obtaining IV. Labs drawn.   Fetal Monitoring: Baseline: 130 bpm Variability: moderate Accelerations: 15x15 Decelerations: none (multiple areas of maternal HR tracing due to patient moving) Contractions: irregular tracing, as frequent as q 2 minutes  Results for orders placed or performed during the hospital encounter of 06/21/19 (from the past 24 hour(s))  CBC with Differential/Platelet     Status: Abnormal   Collection Time: 06/21/19  5:52 PM  Result Value Ref Range   WBC 13.4 (H) 4.0 - 10.5 K/uL   RBC 3.74 (L) 3.87 - 5.11 MIL/uL   Hemoglobin 11.7 (L) 12.0 - 15.0 g/dL   HCT 53.634.1 (L) 64.436.0 - 03.446.0 %   MCV 91.2 80.0 - 100.0 fL   MCH 31.3 26.0 - 34.0 pg   MCHC 34.3 30.0 - 36.0 g/dL   RDW 74.212.5 59.511.5 - 63.815.5 %   Platelets 385 150 - 400 K/uL   nRBC 0.0 0.0 - 0.2 %    Neutrophils Relative % 80 %   Neutro Abs 10.9 (H) 1.7 - 7.7 K/uL   Lymphocytes Relative 14 %   Lymphs Abs 1.9 0.7 - 4.0 K/uL   Monocytes Relative 5 %   Monocytes Absolute 0.6 0.1 - 1.0 K/uL   Eosinophils Relative 0 %   Eosinophils Absolute 0.0 0.0 - 0.5 K/uL   Basophils Relative 0 %   Basophils Absolute 0.0 0.0 - 0.1 K/uL   Immature Granulocytes 1 %  Abs Immature Granulocytes 0.07 0.00 - 0.07 K/uL  Comprehensive metabolic panel     Status: Abnormal   Collection Time: 06/21/19  5:52 PM  Result Value Ref Range   Sodium 132 (L) 135 - 145 mmol/L   Potassium 3.8 3.5 - 5.1 mmol/L   Chloride 98 98 - 111 mmol/L   CO2 19 (L) 22 - 32 mmol/L   Glucose, Bld 68 (L) 70 - 99 mg/dL   BUN <5 (L) 6 - 20 mg/dL   Creatinine, Ser 0.59 0.44 - 1.00 mg/dL   Calcium 9.2 8.9 - 10.3 mg/dL   Total Protein 7.6 6.5 - 8.1 g/dL   Albumin 2.8 (L) 3.5 - 5.0 g/dL   AST 17 15 - 41 U/L   ALT 12 0 - 44 U/L   Alkaline Phosphatase 193 (H) 38 - 126 U/L   Total Bilirubin 1.2 0.3 - 1.2 mg/dL   GFR calc non Af Amer >60 >60 mL/min   GFR calc Af Amer >60 >60 mL/min   Anion gap 15 5 - 15  Protein / creatinine ratio, urine     Status: Abnormal   Collection Time: 06/21/19  5:57 PM  Result Value Ref Range   Creatinine, Urine 189.39 mg/dL   Total Protein, Urine 52 mg/dL   Protein Creatinine Ratio 0.27 (H) 0.00 - 0.15 mg/mg[Cre]  Urinalysis, Routine w reflex microscopic     Status: Abnormal   Collection Time: 06/21/19  5:57 PM  Result Value Ref Range   Color, Urine AMBER (A) YELLOW   APPearance CLOUDY (A) CLEAR   Specific Gravity, Urine 1.019 1.005 - 1.030   pH 5.0 5.0 - 8.0   Glucose, UA NEGATIVE NEGATIVE mg/dL   Hgb urine dipstick LARGE (A) NEGATIVE   Bilirubin Urine NEGATIVE NEGATIVE   Ketones, ur 80 (A) NEGATIVE mg/dL   Protein, ur 100 (A) NEGATIVE mg/dL   Nitrite NEGATIVE NEGATIVE   Leukocytes,Ua SMALL (A) NEGATIVE   RBC / HPF 21-50 0 - 5 RBC/hpf   WBC, UA >50 (H) 0 - 5 WBC/hpf   Bacteria, UA RARE (A)  NONE SEEN   Squamous Epithelial / LPF 21-50 0 - 5   Mucus PRESENT    Reviewed urine culture from last visit in the office. Patient has UTI and needs treatment. RN reports patient's cervical exam has changed from 1cm to 2.5 cm. Discussed this with Dr. Harolyn Rutherford. Recommends 50 mg IV Fentanyl and recheck in 1 hour.  RN rechecked and patient is 5 cm and 100%  Assessment and Plan  2045 - Care turned over to labor team    Luvenia Redden, PA-C 06/21/2019 8:49 PM

## 2019-06-21 NOTE — H&P (Signed)
History      CSN: [id number]   Arrival date and time: 06/21/19 1531    First Provider Initiated Contact with Patient 06/21/19 1632          Chief Complaint  Patient presents with  . Pelvic Pain  . Decreased Fetal Movement   HPI Patty Valdez is a 33 y.o. G4P3003 at 6472w2d who presents to MAU as a labor check. She endorses new onset painful contractions early this morning. She rates her pain as 7-8/10. She denies aggravating or alleviating factors. She denies vaginal bleeding, leaking of fluid, decreased fetal movement, fever, falls, or recent illness.    Patient's history includes Term SVD x 2 followed by cesarean section. Patient is anxious and agitated that she may be in labor and has not yet made a decision regarding her route of delivery. She reports that a friend counseled her about uterine rupture and she now feels pressured to make a decision.   Patient denies headache, visual disturbances, right upper quadrant/epigastric pain, new onset swelling or weight gain.             OB History     Gravida  4   Para  3   Term  3   Preterm      AB      Living  3      SAB      TAB      Ectopic      Multiple  0   Live Births  3                  Past Medical History:  Diagnosis Date  . Abdominal pain    . Anemia 10 yrs ago  . Anxiety    . Chronic cholecystitis with calculus 01/08/2012  . Complication of anesthesia 04-08-2009    low blood pressure after epidural, had to take iv meds for  . Gallstones    . Genital herpes      no current meds for  . Hepatitis C    . Migraine    . Nausea    . Obesity    . Vaginal bleeding            Past Surgical History:  Procedure Laterality Date  . CESAREAN SECTION N/A 05/15/2015    Procedure: CESAREAN SECTION;  Surgeon: Kathreen CosierBernard A Marshall, MD;  Location: [facility];  Service: Obstetrics;  Laterality: N/A;  . CHOLECYSTECTOMY      . I&D EXTREMITY Left 04/02/2017    Procedure: IRRIGATION AND DEBRIDEMENT EXTREMITY;  Surgeon:  Knute Neuoley, Harrill, MD;  Location: [facility];  Service: Plastics;  Laterality: Left;          Family History  Problem Relation Age of Onset  . Cancer Maternal Grandfather          prostate  . Hypertension Mother    . Diabetes Mother    . Neuropathy Mother       Social History         Tobacco Use  . Smoking status: Current Every Day Smoker      Packs/day: 0.50      Years: 10.00      Pack years: 5.00      Types: Cigarettes  . Smokeless tobacco: Never Used  . Tobacco comment: 6 cigarettes per day  Substance Use Topics  . Alcohol use: No      Comment: last drink was 1 yr ago  . Drug use: Not Currently  Frequency: 2.0 times per week      Types: Marijuana     Allergies: No Known Allergies          Medications Prior to Admission  Medication Sig Dispense Refill Last Dose  . buprenorphine (SUBUTEX) 2 MG SUBL SL tablet Place 8 mg under the tongue 3 (three) times daily. Pt taking 8 mg po tid     06/21/2019 at Unknown time  . Prenat w/o A-FeCbGl-DSS-FA-DHA (CITRANATAL ASSURE) 35-1 & 300 MG tablet Take 1 tablet by mouth daily. 30 each 11 06/21/2019 at Unknown time  . valACYclovir (VALTREX) 500 MG tablet Take 1 tablet (500 mg total) by mouth 2 (two) times daily. 60 tablet 1 06/21/2019 at Unknown time  . Prenat w/o A-FeCbGl-DSS-FA-DHA (CITRANATAL ASSURE PO) Take 2 tablets by mouth daily. Combo pack           Review of Systems  Constitutional: Negative for chills, fatigue and fever.  Respiratory: Negative for shortness of breath.   Cardiovascular: Negative for chest pain and leg swelling.  Gastrointestinal: Positive for abdominal pain.  Genitourinary: Positive for pelvic pain. Negative for difficulty urinating, dysuria, flank pain, vaginal bleeding, vaginal discharge and vaginal pain.  Musculoskeletal: Negative for back pain.  Neurological: Negative for headaches.  All other systems reviewed and are negative.   Physical Exam    Blood pressure 113/67, pulse 89, temperature 98.2 F (36.8  C), temperature source Oral, resp. rate 18, height 5\' 3"  (1.6 m), weight 68.9 kg, last menstrual period 02/11/2019, SpO2 100 %.   Physical Exam  Nursing note and vitals reviewed. Constitutional: She is oriented to person, place, and time. She appears well-developed and well-nourished.  Cardiovascular: Normal rate.  Respiratory: Effort normal.  GI:  Gravid  Neurological: She is alert and oriented to person, place, and time.  Skin: Skin is warm and dry.  Psychiatric: She has a normal mood and affect. Her behavior is normal. Judgment and thought content normal.      MAU Course/MDM  Procedures   --Patient remains 1cm following one hour of observation in MAU --Offered and accepted IV fluid bolus and Phenergan for discomfort --Explained to patient that pelvic pain may also be related to untreated UTI --Elevated blood pressure on arrival in MAU as well as at new ob appt 07/27. Patient now meets criteria for Preeclampsia.  Also noted to have elevated P:Cr at new ob.  --Report given to J. Hillard Danker, PA who assumes care at this time.   Mallie Snooks, MSN, CNM Certified Nurse Midwife, Faculty Practice 06/21/19 5:34 PM    1734 - Assumed care from Maryelizabeth Kaufmann, CNM. RN having difficulty obtaining IV. Labs drawn.    Fetal Monitoring: Baseline: 130 bpm Variability: moderate Accelerations: 15x15 Decelerations: none (multiple areas of maternal HR tracing due to patient moving) Contractions: irregular tracing, as frequent as q 2 minutes   Lab Results Last 24 Hours       Results for orders placed or performed during the hospital encounter of 06/21/19 (from the past 24 hour(s))  CBC with Differential/Platelet     Status: Abnormal    Collection Time: 06/21/19  5:52 PM  Result Value Ref Range    WBC 13.4 (H) 4.0 - 10.5 K/uL    RBC 3.74 (L) 3.87 - 5.11 MIL/uL    Hemoglobin 11.7 (L) 12.0 - 15.0 g/dL    HCT 34.1 (L) 36.0 - 46.0 %    MCV 91.2 80.0 - 100.0 fL    MCH 31.3 26.0 - 34.0 pg  MCHC 34.3 30.0 - 36.0 g/dL    RDW 16.112.5 09.611.5 - 04.515.5 %    Platelets 385 150 - 400 K/uL    nRBC 0.0 0.0 - 0.2 %    Neutrophils Relative % 80 %    Neutro Abs 10.9 (H) 1.7 - 7.7 K/uL    Lymphocytes Relative 14 %    Lymphs Abs 1.9 0.7 - 4.0 K/uL    Monocytes Relative 5 %    Monocytes Absolute 0.6 0.1 - 1.0 K/uL    Eosinophils Relative 0 %    Eosinophils Absolute 0.0 0.0 - 0.5 K/uL    Basophils Relative 0 %    Basophils Absolute 0.0 0.0 - 0.1 K/uL    Immature Granulocytes 1 %    Abs Immature Granulocytes 0.07 0.00 - 0.07 K/uL  Comprehensive metabolic panel     Status: Abnormal    Collection Time: 06/21/19  5:52 PM  Result Value Ref Range    Sodium 132 (L) 135 - 145 mmol/L    Potassium 3.8 3.5 - 5.1 mmol/L    Chloride 98 98 - 111 mmol/L    CO2 19 (L) 22 - 32 mmol/L    Glucose, Bld 68 (L) 70 - 99 mg/dL    BUN <5 (L) 6 - 20 mg/dL    Creatinine, Ser 4.090.59 0.44 - 1.00 mg/dL    Calcium 9.2 8.9 - 81.110.3 mg/dL    Total Protein 7.6 6.5 - 8.1 g/dL    Albumin 2.8 (L) 3.5 - 5.0 g/dL    AST 17 15 - 41 U/L    ALT 12 0 - 44 U/L    Alkaline Phosphatase 193 (H) 38 - 126 U/L    Total Bilirubin 1.2 0.3 - 1.2 mg/dL    GFR calc non Af Amer >60 >60 mL/min    GFR calc Af Amer >60 >60 mL/min    Anion gap 15 5 - 15  Protein / creatinine ratio, urine     Status: Abnormal    Collection Time: 06/21/19  5:57 PM  Result Value Ref Range    Creatinine, Urine 189.39 mg/dL    Total Protein, Urine 52 mg/dL    Protein Creatinine Ratio 0.27 (H) 0.00 - 0.15 mg/mg[Cre]  Urinalysis, Routine w reflex microscopic     Status: Abnormal    Collection Time: 06/21/19  5:57 PM  Result Value Ref Range    Color, Urine AMBER (A) YELLOW    APPearance CLOUDY (A) CLEAR    Specific Gravity, Urine 1.019 1.005 - 1.030    pH 5.0 5.0 - 8.0    Glucose, UA NEGATIVE NEGATIVE mg/dL    Hgb urine dipstick LARGE (A) NEGATIVE    Bilirubin Urine NEGATIVE NEGATIVE    Ketones, ur 80 (A) NEGATIVE mg/dL    Protein, ur 914100 (A) NEGATIVE mg/dL     Nitrite NEGATIVE NEGATIVE    Leukocytes,Ua SMALL (A) NEGATIVE    RBC / HPF 21-50 0 - 5 RBC/hpf    WBC, UA >50 (H) 0 - 5 WBC/hpf    Bacteria, UA RARE (A) NONE SEEN    Squamous Epithelial / LPF 21-50 0 - 5    Mucus PRESENT       Reviewed urine culture from last visit in the office. Patient has UTI and needs treatment. RN reports patient's cervical exam has changed from 1cm to 2.5 cm. Discussed this with Dr. Macon LargeAnyanwu. Recommends 50 mg IV Fentanyl and recheck in 1 hour.  RN rechecked and patient is 5 cm  and 100%   Assessment and Plan  2045 - Care turned over to labor team      Marny LowensteinWenzel, Julie N, PA-C 06/21/2019 8:49 PM    Attestation of Attending Supervision of Advanced Practice Provider (PA/CNM/NP): Evaluation and management procedures were performed by the Advanced Practice Provider under my supervision and collaboration.  I have reviewed the Advanced Practice Provider's note and chart, and I agree with the management and plan.  Patient limited prenatal care here in active preterm labor.  Cervix is now 5-6/100/ballotable/BBOW, patient has a history of cesarean section in 2016 preceded by two term SVDs.  She is unsure if she wants to have TOLAC vs RCS.  Counseled regarding TOLAC vs RCS; risks/benefits discussed in detail , and read through each line of the consent. Emphasized the rare risk of uterine rupture and possible sequelae of this, also discussed possible risks/sequalae of repeat cesarean delivery.  All questions answered.  Patient elects for TOLAC, consent signed today.     She is GBS positive, Ampicillin ordered.  COVID test pending.  Fentanyl ordered for analgesia here, she desires epidural.  BP was elevated at admission (142/74) and and her initial OB appointment on 06/15/19  (131/90); she had a positive urine Pr:Cr of 0.6 and meets criteria for preeclampsia without severe features.  Will monitor BP closely and treat as needed.  History of untreated Hepatitis C and she is on Subutex.  History of HSV, no symptoms, no recent outbreak. SSE done by me, negative for any lesions.  Category I FHR tracing.  Hopeful for VBAC soon.   Jaynie CollinsUGONNA  Thy Gullikson, MD, FACOG Obstetrician & Gynecologist, [facility], [facility]

## 2019-06-21 NOTE — Telephone Encounter (Signed)
Attempted to call pt re: +UTI to prescribe ABX. Left message stating patient can call [facility] and/or discuss with provider at visit tomorrow 06/22/2019.  Clarisa Fling, NP  3:39 PM 06/21/2019

## 2019-06-21 NOTE — MAU Note (Signed)
Patty Valdez is a 33 y.o. at [redacted]w[redacted]d here in MAU reporting: pelvic pain that is intermittent since this AM. States the pain comes every couple of minutes. Denies vaginal bleeding or LOF. States movements have been decreased today since the pain started.  Onset of complaint: this AM  Pain score: 5/10  Vitals:   06/21/19 1557  BP: (!) 142/74  Pulse: 80  Resp: 18  Temp: 98.2 F (36.8 C)  SpO2: 100%     FHT: 138  Lab orders placed from triage: none, pt unable to use bathroom

## 2019-06-22 ENCOUNTER — Encounter: Payer: Medicaid Other | Admitting: Obstetrics & Gynecology

## 2019-06-22 DIAGNOSIS — O99824 Streptococcus B carrier state complicating childbirth: Secondary | ICD-10-CM

## 2019-06-22 DIAGNOSIS — Z3A36 36 weeks gestation of pregnancy: Secondary | ICD-10-CM

## 2019-06-22 LAB — CBC WITH DIFFERENTIAL/PLATELET
Abs Immature Granulocytes: 0.15 10*3/uL — ABNORMAL HIGH (ref 0.00–0.07)
Basophils Absolute: 0 10*3/uL (ref 0.0–0.1)
Basophils Relative: 0 %
Eosinophils Absolute: 0 10*3/uL (ref 0.0–0.5)
Eosinophils Relative: 0 %
HCT: 30.4 % — ABNORMAL LOW (ref 36.0–46.0)
Hemoglobin: 10.2 g/dL — ABNORMAL LOW (ref 12.0–15.0)
Immature Granulocytes: 1 %
Lymphocytes Relative: 7 %
Lymphs Abs: 1.2 10*3/uL (ref 0.7–4.0)
MCH: 31 pg (ref 26.0–34.0)
MCHC: 33.6 g/dL (ref 30.0–36.0)
MCV: 92.4 fL (ref 80.0–100.0)
Monocytes Absolute: 0.3 10*3/uL (ref 0.1–1.0)
Monocytes Relative: 2 %
Neutro Abs: 15.3 10*3/uL — ABNORMAL HIGH (ref 1.7–7.7)
Neutrophils Relative %: 90 %
Platelets: 327 10*3/uL (ref 150–400)
RBC: 3.29 MIL/uL — ABNORMAL LOW (ref 3.87–5.11)
RDW: 12.6 % (ref 11.5–15.5)
WBC: 17 10*3/uL — ABNORMAL HIGH (ref 4.0–10.5)
nRBC: 0 % (ref 0.0–0.2)

## 2019-06-22 LAB — ABO/RH: ABO/RH(D): O POS

## 2019-06-22 MED ORDER — ONDANSETRON HCL 4 MG PO TABS: 4.0000 mg | ORAL_TABLET | ORAL | Status: DC | PRN

## 2019-06-22 MED ORDER — SIMETHICONE 80 MG PO CHEW: 80.0000 mg | CHEWABLE_TABLET | ORAL | Status: DC | PRN

## 2019-06-22 MED ORDER — MISOPROSTOL 200 MCG PO TABS
ORAL_TABLET | ORAL | Status: AC
  Filled 2019-06-22: qty 5

## 2019-06-22 MED ORDER — PRENATAL MULTIVITAMIN CH
1.0000 | ORAL_TABLET | Freq: Every day | ORAL | Status: DC
  Administered 2019-06-22 – 2019-06-24 (×3): 1 via ORAL
  Filled 2019-06-22 (×3): qty 1

## 2019-06-22 MED ORDER — IBUPROFEN 600 MG PO TABS
600.0000 mg | ORAL_TABLET | Freq: Four times a day (QID) | ORAL | Status: DC
  Administered 2019-06-22 – 2019-06-24 (×10): 600 mg via ORAL
  Filled 2019-06-22 (×10): qty 1

## 2019-06-22 MED ORDER — BENZOCAINE-MENTHOL 20-0.5 % EX AERO
1.0000 "application " | INHALATION_SPRAY | CUTANEOUS | Status: DC | PRN
  Filled 2019-06-22: qty 56

## 2019-06-22 MED ORDER — DIPHENHYDRAMINE HCL 25 MG PO CAPS: 25.0000 mg | ORAL_CAPSULE | Freq: Four times a day (QID) | ORAL | Status: DC | PRN

## 2019-06-22 MED ORDER — MISOPROSTOL 200 MCG PO TABS
1000.0000 ug | ORAL_TABLET | Freq: Once | ORAL | Status: AC
  Administered 2019-06-22: 1000 ug via RECTAL

## 2019-06-22 MED ORDER — COCONUT OIL OIL: 1.0000 "application " | TOPICAL_OIL | Status: DC | PRN

## 2019-06-22 MED ORDER — ONDANSETRON HCL 4 MG/2ML IJ SOLN: 4.0000 mg | INTRAMUSCULAR | Status: DC | PRN

## 2019-06-22 MED ORDER — SENNOSIDES-DOCUSATE SODIUM 8.6-50 MG PO TABS
2.0000 | ORAL_TABLET | ORAL | Status: DC
  Administered 2019-06-23 (×2): 2 via ORAL
  Filled 2019-06-22 (×2): qty 2

## 2019-06-22 MED ORDER — ACETAMINOPHEN 325 MG PO TABS
650.0000 mg | ORAL_TABLET | ORAL | Status: DC | PRN
  Administered 2019-06-22 – 2019-06-23 (×3): 650 mg via ORAL
  Filled 2019-06-22 (×3): qty 2

## 2019-06-22 MED ORDER — DIBUCAINE (PERIANAL) 1 % EX OINT: 1.0000 "application " | TOPICAL_OINTMENT | CUTANEOUS | Status: DC | PRN

## 2019-06-22 MED ORDER — TETANUS-DIPHTH-ACELL PERTUSSIS 5-2.5-18.5 LF-MCG/0.5 IM SUSP: 0.5000 mL | Freq: Once | INTRAMUSCULAR | Status: DC

## 2019-06-22 MED ORDER — WITCH HAZEL-GLYCERIN EX PADS: 1.0000 "application " | MEDICATED_PAD | CUTANEOUS | Status: DC | PRN

## 2019-06-22 NOTE — Progress Notes (Signed)
Called to bedside by RN for continued passage of clots/moderate amount of blood on fundal massage. Upon examination of vagina/cervix 1st degree perineal laceration noted that was not present prior, actively oozing. 7cc lidocaine used for local anesthetic. 3-0 vicryl used to repair this laceration with figure of eight stitch x2. Laceration was hemostatic upon completion. Patient was given 1000 mcg rectal cytotec. Patient remains asymptomatic. RN instructed to continue monitor bleeding and report any changes.  Corliss Blacker, [facility]

## 2019-06-22 NOTE — Discharge Instructions (Signed)

## 2019-06-22 NOTE — Progress Notes (Signed)
Plan of care for postpartum cytotec for increased bleeding discussed with Dr. Sylvester Harder, Resident regarding patient having a previous c-section. Resident states to continue with plan of care.

## 2019-06-22 NOTE — Anesthesia Postprocedure Evaluation (Signed)
Anesthesia Post Note  [name]  Procedure(s) Performed: AN AD [place]     Patient location during evaluation: Mother Baby Anesthesia Type: Epidural Level of consciousness: awake and alert Pain management: pain level controlled Vital Signs Assessment: post-procedure vital signs reviewed and stable Respiratory status: spontaneous breathing, nonlabored ventilation and respiratory function stable Cardiovascular status: stable Postop Assessment: no headache, no backache, epidural receding, no apparent nausea or vomiting, patient able to bend at knees, adequate PO intake and able to ambulate Anesthetic complications: no    Last Vitals:  Vitals:   06/22/19 1005 06/22/19 1410  BP: 122/61 122/64  Pulse: 74 77  Resp: 18 18  Temp: 36.7 C 36.7 C  SpO2:      Last Pain:  Vitals:   06/22/19 1556  TempSrc:   PainSc: 5    Pain Goal: Patients Stated Pain Goal: 3 (06/22/19 1556)                 Talitha Givens

## 2019-06-22 NOTE — Plan of Care (Signed)

## 2019-06-22 NOTE — Progress Notes (Signed)
RN encouraged pt hold infant, pt appeared irritable, verbalized "I don't want to hold him".

## 2019-06-22 NOTE — Progress Notes (Signed)
OB/GYN Faculty Practice: Labor Progress Note  Subjective: Patient resting well without complaints.  Objective: BP 117/69   Pulse 86   Temp 97.7 F (36.5 C) (Oral)   Resp (!) 21   Ht 5\' 3"  (1.6 m)   Wt 68.9 kg   LMP 02/11/2019   SpO2 100%   BMI 26.91 kg/m  Gen: well appearing, NAD Cervical exam: 9/90/-1  FHT: baseline 140 bmp, mod variability, +accels, no decels Toco: q4-5 minutes  Assessment and Plan: 33 y.o. [id number] 103w3d presents for preterm labor.  TOLAC/Labor: progressing well, AROM performed @0130  with meconium, particulate fluid, foley catheter removed from vagina at that time. Will continue time appropriate cervical exams. -- pain control: CSE  Fetal Well-Being: EFW 2640g (55%ile) by 35w U/S. Cephalic by BSUS.  -- Category 1 - continuous fetal monitoring  -- GBS positive  GBS positive --amp 2g (2141)  Hep C --no treatment --HCV RNA quant 215k (06/15/2019)  Drug use --prior heroin use per notes --UDS + cannabinoids, codeine, morphine (06/15/2019) --patient reportedly on subutex 8 mg TID, ordered --SW consult post partum  HSV --prior c/s due to active outbreak --recently started on valtrex 500 mg BID --SSE without any lesions --will proceed with TOLAC  Pre-Eclampsia --P/C 0.6 (06/15/2019), all other labs unremarkable --pt asymptomatic --blood pressures well controlled --continue to monitor  Corliss Blacker, PGY-III Family Medicine 1:32 AM

## 2019-06-22 NOTE — Progress Notes (Signed)
CSW informed by Pam Miller with [place] CPS that case has been assigned to Michelle Eleby as a 72 hour response. CSW has attempted to reach out to Michelle and has left voicemail for return call. CSW will continue to follow.  Raygen Dahm, LCSWA  [facility] [phone]  

## 2019-06-22 NOTE — Clinical Social Work Maternal (Addendum)
CLINICAL SOCIAL WORK MATERNAL/CHILD NOTE  Patient Details  Name: Patty Valdez MRN: [id number] Date of Birth: 04/09/1986  Date:  06/22/2019  Clinical Social Worker Initiating Note:  Clarion Mooneyhan Date/Time: Initiated:  06/22/19/1036     Child's Name:  Patty Valdez   Biological Parents:  Mother, Father(Patty Valdez and Patty Valdez DOB: 10/14/1991)   Need for Interpreter:  None   Reason for Referral:  Behavioral Health Concerns, Current Substance Use/Substance Use During Pregnancy    Address:  [address] [place] [address]    Phone number:  [phone] (home)     Additional phone number:   Household Members/Support Persons (HM/SP):   Household Member/Support Person 1, Household Member/Support Person 2, Household Member/Support Person 3, Household Member/Support Person 4   HM/SP Name Relationship DOB or Age  HM/SP -1 Patty Valdez FOB 10/14/1991  HM/SP -2 Patty Valdez Son 05/15/2015  HM/SP -3 Patty Valdez Son 01/27/2008  HM/SP -4 Patty Valdez Son 04/08/2009  HM/SP -5        HM/SP -6        HM/SP -7        HM/SP -8          Natural Supports (not living in the home):  Parent   Professional Supports: None   Employment: Unemployed   Type of Work:     Education:  High school graduate   Homebound arranged:    Financial Resources:  Medicaid   Other Resources:  Food Stamps    Cultural/Religious Considerations Which May Impact Care:    Strengths:  Ability to meet basic needs , Home prepared for child    Psychotropic Medications:         Pediatrician:       Pediatrician List:   [place]    [place]    [place]    [place]    [place]    [place]      Pediatrician Fax Number:    Risk Factors/Current Problems:  Mental Health Concerns , Substance Use    Cognitive State:  Able to Concentrate , Alert , Linear Thinking    Mood/Affect:  Flat , Agitated    CSW Assessment: CSW received  consult for history of anxiety, depression, PTSD and history of substance use during pregnancy.  CSW met with MOB to offer support and complete assessment.    MOB resting in bed with infant asleep beside her and FOB present at bedside. CSW introduced self and received verbal permission to have FOB step out of the room so that CSW could complete assessment. FOB left voluntarily. CSW explained reason for consult to which MOB expressed understanding. MOB was not very engaged during assessment and appeared mildly agitated but answered CSW's questions. MOB reported that she currently lives with FOB and their son in [place]. MOB stated she has two other sons but that she has joint custody of them with their father (Patty Valdez). CSW inquired about contact information for Patty but MOB was not willing to provide. MOB reported she is not currently employed and stated she receives food stamps but not WIC. MOB expressed intentions in applying for WIC and stated she had their contact information to reach out to when ready.   CSW inquired about MOB's mental health history and MOB acknowledged having a history of anxiety, depression and PTSD. CSW inquired about how long MOB had experienced symptoms but MOB unable to provide details but nodded yes when asked if it has been   over 3 years. CSW inquired about if MOB experienced any mental health symptoms during pregnancy and MOB reported "not anything that I wasn't experiencing before". CSW inquired about if MOB was currently having any feelings of depression or anxiety and MOB nodded yes. CSW asked MOB about symptoms she was experiencing but MOB not willing to elaborate further. MOB did not appear to be experiencing any acute mental health symptoms and denied any current SI, HI or DV. MOB also denied currently being on medications but reported she sees a Social worker monthly through [facility]. MOB denied any PPD with her other children. CSW provided education  regarding the baby blues period vs. perinatal mood disorders, discussed treatment and gave resources for mental health follow up if concerns arise.  CSW recommends self-evaluation during the postpartum time period using the New Mom Checklist from Postpartum Progress and encouraged MOB to contact a medical professional if symptoms are noted at any time.    CSW inquired about reasoning for MOB's late prenatal care and MOB stated she did not find out she was pregnant until she was 6 months along. MOB stated she was having a period and did not start showing until 2 months prior to her delivery. MOB also shared that she had some difficulty getting into see someone once she found out she was pregnant. MOB seemed remorseful for not having started prenatal care before she did. CSW inquired about MOB's substance use during pregnancy and MOB acknowledged using Xanax and THC during pregnancy. CSW inquired about MOB's positive UDS on 06/15/2019 for THC, morphine and codeine. MOB unsure as to why she would test positive for morphine and codeine but openly disclosed to using THC and Xanax during pregnancy. CSW inquired about if MOB had an active prescription for Xanax and MOB denied. MOB reported she got it off the street and stated she used Xanax to help with her anxiety. MOB reported last use of both the Xanax and THC was 2-3 weeks ago. CSW inquired about if MOB used heroine during her pregnancy as codeine and morphine can often be metabolites of heroin to which MOB denied. MOB reported she is currently taking Subutex that is prescribed through [facility]. CSW inquired about FOB's substance use and MOB denied usage and stated he has been on Suboxone for a year now but would not elaborate further as to why. CSW informed MOB of Hospital Drug Policy and explained UDS and CDS were still pending and would continue to be monitored. CSW explained that [facility] CPS report would have to be made due to MOB's substance  use during pregnancy. MOB became concerned and asked appropriate questions regarding what would happen next. CSW explained process of report and that someone would likely be in touch in the next 24 hours. CSW explained that disposition would be up to CPS. MOB denied any further questions or concerns regarding policy or report.   MOB reported having all essential items for infant once discharged except for a carseat but reported they would have one before discharge. CSW provided review of Sudden Infant Death Syndrome (SIDS) precautions and safe sleeping habits. CSW offered to refer MOB to programs that could offer additional support to family once discharged but MOB declined at this time but stated she has someone who calls and checks on her through [place]. MOB unable to provide name or role of person who checks in.     CSW made [facility] CPS report to Wendall Stade in Intake due to  MOB's substance use during pregnancy. CSW will continue to monitor UDS and CDS and update CPS of results. At this time, there are barriers to infant discharging to MOB. CSW to await CPS disposition.   CSW Plan/Description:  Sudden Infant Death Syndrome (SIDS) Education, Perinatal Mood and Anxiety Disorder (PMADs) Education, Hospital Drug Screen Policy Information, Child Protective Service Report , CSW Awaiting CPS Disposition Plan, CSW Will Continue to Monitor Umbilical Cord Tissue Drug Screen Results and Make Report if Foye Spurling, [place] 08/16/19, 11:35 AM

## 2019-06-22 NOTE — Progress Notes (Signed)
LPT infant instruction given

## 2019-06-22 NOTE — Discharge Summary (Signed)
Postpartum Discharge Summary     Patient Name: Patty Valdez DOB: [birth year] MRN: [id number]  Date of admission: 06/21/2019 Delivering Provider: Arrie Senate   Date of discharge: 06/24/2019  Admitting diagnosis: CTX 2-3 MIN Intrauterine pregnancy: [redacted]w[redacted]d     Secondary diagnosis:  Principal Problem:   Preterm labor third trimester with preterm delivery third trimester Active Problems:   Supervision of high risk pregnancy, antepartum   S/P cesarean section   Chronic hepatitis C affecting antepartum care of mother in third trimester ([place])   Preeclampsia, third trimester   Pregnancy complicated by subutex maintenance, antepartum ([place])  Additional problems: n/a     Discharge diagnosis: Term Pregnancy Delivered and VBAC                                                                                                Post partum procedures:n/a  Augmentation: AROM  Complications: None  Hospital course:  Onset of Labor With Vaginal Delivery     33 y.o. yo [id number] at [redacted]w[redacted]d was admitted in Active Labor on 06/21/2019. She progressed to 8cm without augmentation. AROM with thick particulate meconium and delivered 1 hour later. Patient had an uncomplicated labor course as follows:  Membrane Rupture Time/Date: 1:31 AM ,06/22/2019   Intrapartum Procedures: Episiotomy: None [1]                                         Lacerations:  1st degree [2];Periurethral [8]  Patient had a delivery of a Viable infant. 06/22/2019  Information for the patient's newborn:  Lovetta, Condie [[id number]]       Pateint had an uncomplicated postpartum course.  She is ambulating, tolerating a regular diet, passing flatus, and urinating well. Patient is discharged home in stable condition on 06/24/19.   Magnesium Sulfate recieved: No BMZ received: Yes  Physical exam  Vitals:   06/23/19 1350 06/23/19 1511 06/23/19 2220 06/24/19 0611  BP: (!) 145/74 135/83 139/83 133/70  Pulse: 74 64 (!) 58 62  Resp: 18   18 18   Temp: 98.1 F (36.7 C)  98.1 F (36.7 C) 97.8 F (36.6 C)  TempSrc: Oral  Oral Oral  SpO2:    100%  Weight:      Height:       General: alert, cooperative and no distress Lochia: appropriate Uterine Fundus: firm Incision: N/A DVT Evaluation: No evidence of DVT seen on physical exam. Labs: Lab Results  Component Value Date   WBC 17.0 (H) 06/22/2019   HGB 10.2 (L) 06/22/2019   HCT 30.4 (L) 06/22/2019   MCV 92.4 06/22/2019   PLT 327 06/22/2019   CMP Latest Ref Rng & Units 06/21/2019  Glucose 70 - 99 mg/dL 68(L)  BUN 6 - 20 mg/dL <5(L)  Creatinine 0.44 - 1.00 mg/dL 0.59  Sodium 135 - 145 mmol/L 132(L)  Potassium 3.5 - 5.1 mmol/L 3.8  Chloride 98 - 111 mmol/L 98  CO2 22 - 32 mmol/L 19(L)  Calcium 8.9 - 10.3 mg/dL 9.2  Total Protein 6.5 - 8.1 g/dL 7.6  Total Bilirubin 0.3 - 1.2 mg/dL 1.2  Alkaline Phos 38 - 126 U/L 193(H)  AST 15 - 41 U/L 17  ALT 0 - 44 U/L 12    Discharge instruction: per After Visit Summary and "Baby and Me Booklet".  After visit meds:  Allergies as of 06/24/2019   No Known Allergies     Medication List    TAKE these medications   buprenorphine 2 MG Subl SL tablet Commonly known as: SUBUTEX Place 8 mg under the tongue 3 (three) times daily. Pt taking 8 mg po tid   CITRANATAL ASSURE PO Take 2 tablets by mouth daily. Combo pack   CitraNatal Assure 35-1 & 300 MG tablet Take 1 tablet by mouth daily.   ibuprofen 600 MG tablet Commonly known as: ADVIL Take 1 tablet (600 mg total) by mouth every 6 (six) hours.   valACYclovir 500 MG tablet Commonly known as: Valtrex Take 1 tablet (500 mg total) by mouth 2 (two) times daily.       Diet: routine diet  Activity: Advance as tolerated. Pelvic rest for 6 weeks.   Outpatient follow up:4 weeks Follow up Appt: Future Appointments  Date Time Provider Department Center  07/28/2019  9:45 AM Constant, Gigi GinPeggy, MD [facility] None   Follow up Visit: Please schedule this patient for Postpartum visit  in: 4 weeks with the following provider: Any provider For C/S patients schedule nurse incision check in weeks 2 weeks: no Low risk pregnancy complicated by: n/a Delivery mode:  SVD Anticipated Birth Control:  other/unsure PP Procedures needed: n/a  Schedule Integrated BH visit: yes     Newborn Data: Live born female  Birth Weight:   APGAR: 9, 9  Newborn Delivery   Birth date/time: 06/22/2019 02:16:00 Delivery type: VBAC, Spontaneous      Baby Feeding: Breast Disposition:home with mother   06/24/2019 Wynelle BourgeoisMarie Marvelle Caudill, CNM

## 2019-06-23 NOTE — Progress Notes (Signed)
CSW spoke with Michelle Eleby, assigned [place] CPS worker, and updated her of infant's UDS results. Michelle reported she would likely be by to visit with MOB around 12pm. CSW to update [facility] of the above information.  Omari Mcmanaway, LCSWA  [facility] [phone]  

## 2019-06-23 NOTE — Progress Notes (Signed)
POSTPARTUM PROGRESS NOTE  Subjective: DIANEY SUCHY is a 33 y.o. [id number] s/p uncomplicated SVD at [redacted]w[redacted]d.  She reports she doing well. No acute events overnight. She denies any problems with ambulating, voiding or po intake. Denies nausea or vomiting. She has passed flatus. Pain is well controlled.  Lochia is improving.  Objective: Blood pressure 114/73, pulse 60, temperature 98.7 F (37.1 C), temperature source Oral, resp. rate 17, height 5\' 3"  (1.6 m), weight 68.9 kg, last menstrual period 02/11/2019, SpO2 99 %.  Physical Exam:  General: alert, cooperative and no distress Chest: no respiratory distress Abdomen: soft, non-tender  Uterine Fundus: firm, appropriately tender Extremities: No calf swelling or tenderness  no edema  Recent Labs    06/21/19 1752 06/22/19 0509  HGB 11.7* 10.2*  HCT 34.1* 30.4*    Assessment/Plan: ANYSHA FRAPPIER is a 33 y.o. [id number] s/p uncomplicated SVD at [redacted]w[redacted]d after presenting for active labor.  Routine Postpartum Care: Doing well, pain well-controlled.  -- Continue routine care, lactation support  -- Contraception: desires IUD at postpartum visit -- Feeding: bottle  Hep C --no treatment --HCV RNA quant 215k (06/15/2019) --consider outpatient follow up with GI for treatment  Drug use --prior heroin use per notes --patient currently reportedly on subutex 8 mg TID, ordered inpatient --unclear who prescribes patient's subutex, also UDS +7/27 for cannabinoids, opiates, codeine, moprhine  --SW consult post partum, not safe for dc per note --would verify PDMP database prior to discharge with subutex  HSV --prior c/s due to active outbreak --recently started on valtrex 500 mg BID, continue  Pre-Eclampsia --P/C 0.6 (06/15/2019), all other labs unremarkable --pt asymptomatic --blood pressures well controlled --continue to monitor  Dispo: Plan for discharge likely home tomorrow.  Corliss Blacker, [facility]

## 2019-06-24 ENCOUNTER — Encounter (HOSPITAL_COMMUNITY): Payer: Self-pay | Admitting: *Deleted

## 2019-06-24 MED ORDER — IBUPROFEN 600 MG PO TABS: 600.0000 mg | ORAL_TABLET | Freq: Four times a day (QID) | ORAL | 0 refills | Status: AC

## 2019-06-25 LAB — RPR: RPR Ser Ql: NONREACTIVE

## 2019-07-10 ENCOUNTER — Ambulatory Visit (HOSPITAL_COMMUNITY): Payer: Medicaid Other

## 2019-07-17 ENCOUNTER — Telehealth: Payer: Self-pay | Admitting: *Deleted

## 2019-07-17 NOTE — Telephone Encounter (Signed)
Patty Valdez left a message on voicemail line that she was told she was referred by [facility] when she had her baby to come to our office. States she has a question- wants to know when is her appointment , and that she had a UTI when she had her baby. She wants to know was she treated and with what and does she need a check. Patty Guarnieri,RN

## 2019-07-28 ENCOUNTER — Ambulatory Visit (INDEPENDENT_AMBULATORY_CARE_PROVIDER_SITE_OTHER): Payer: Medicaid Other | Admitting: Obstetrics and Gynecology

## 2019-07-28 ENCOUNTER — Other Ambulatory Visit: Payer: Self-pay

## 2019-07-28 ENCOUNTER — Encounter: Payer: Self-pay | Admitting: Obstetrics and Gynecology

## 2019-07-28 DIAGNOSIS — Z3202 Encounter for pregnancy test, result negative: Secondary | ICD-10-CM | POA: Diagnosis not present

## 2019-07-28 DIAGNOSIS — Z1389 Encounter for screening for other disorder: Secondary | ICD-10-CM | POA: Diagnosis not present

## 2019-07-28 DIAGNOSIS — F53 Postpartum depression: Secondary | ICD-10-CM

## 2019-07-28 LAB — POCT URINE PREGNANCY: Preg Test, Ur: NEGATIVE

## 2019-07-28 MED ORDER — LISINOPRIL 20 MG PO TABS: 20.0000 mg | ORAL_TABLET | Freq: Every day | ORAL | 1 refills | Status: AC

## 2019-07-28 NOTE — Patient Instructions (Signed)
Levonorgestrel intrauterine device (IUD) What is this medicine? LEVONORGESTREL IUD (LEE voe nor jes trel) is a contraceptive (birth control) device. The device is placed inside the uterus by a healthcare professional. It is used to prevent pregnancy. This device can also be used to treat heavy bleeding that occurs during your period. This medicine may be used for other purposes; ask your health care provider or pharmacist if you have questions. COMMON BRAND NAME(S): Kyleena, LILETTA, Mirena, Skyla What should I tell my health care provider before I take this medicine? They need to know if you have any of these conditions:  abnormal Pap smear  cancer of the breast, uterus, or cervix  diabetes  endometritis  genital or pelvic infection now or in the past  have more than one sexual partner or your partner has more than one partner  heart disease  history of an ectopic or tubal pregnancy  immune system problems  IUD in place  liver disease or tumor  problems with blood clots or take blood-thinners  seizures  use intravenous drugs  uterus of unusual shape  vaginal bleeding that has not been explained  an unusual or allergic reaction to levonorgestrel, other hormones, silicone, or polyethylene, medicines, foods, dyes, or preservatives  pregnant or trying to get pregnant  breast-feeding How should I use this medicine? This device is placed inside the uterus by a health care professional. Talk to your pediatrician regarding the use of this medicine in children. Special care may be needed. Overdosage: If you think you have taken too much of this medicine contact a poison control center or emergency room at once. NOTE: This medicine is only for you. Do not share this medicine with others. What if I miss a dose? This does not apply. Depending on the brand of device you have inserted, the device will need to be replaced every 3 to 6 years if you wish to continue using this type  of birth control. What may interact with this medicine? Do not take this medicine with any of the following medications:  amprenavir  bosentan  fosamprenavir This medicine may also interact with the following medications:  aprepitant  armodafinil  barbiturate medicines for inducing sleep or treating seizures  bexarotene  boceprevir  griseofulvin  medicines to treat seizures like carbamazepine, ethotoin, felbamate, oxcarbazepine, phenytoin, topiramate  modafinil  pioglitazone  rifabutin  rifampin  rifapentine  some medicines to treat HIV infection like atazanavir, efavirenz, indinavir, lopinavir, nelfinavir, tipranavir, ritonavir  St. John's wort  warfarin This list may not describe all possible interactions. Give your health care provider a list of all the medicines, herbs, non-prescription drugs, or dietary supplements you use. Also tell them if you smoke, drink alcohol, or use illegal drugs. Some items may interact with your medicine. What should I watch for while using this medicine? Visit your doctor or health care professional for regular check ups. See your doctor if you or your partner has sexual contact with others, becomes HIV positive, or gets a sexual transmitted disease. This product does not protect you against HIV infection (AIDS) or other sexually transmitted diseases. You can check the placement of the IUD yourself by reaching up to the top of your vagina with clean fingers to feel the threads. Do not pull on the threads. It is a good habit to check placement after each menstrual period. Call your doctor right away if you feel more of the IUD than just the threads or if you cannot feel the threads at   all. The IUD may come out by itself. You may become pregnant if the device comes out. If you notice that the IUD has come out use a backup birth control method like condoms and call your health care provider. Using tampons will not change the position of the  IUD and are okay to use during your period. This IUD can be safely scanned with magnetic resonance imaging (MRI) only under specific conditions. Before you have an MRI, tell your healthcare provider that you have an IUD in place, and which type of IUD you have in place. What side effects may I notice from receiving this medicine? Side effects that you should report to your doctor or health care professional as soon as possible:  allergic reactions like skin rash, itching or hives, swelling of the face, lips, or tongue  fever, flu-like symptoms  genital sores  high blood pressure  no menstrual period for 6 weeks during use  pain, swelling, warmth in the leg  pelvic pain or tenderness  severe or sudden headache  signs of pregnancy  stomach cramping  sudden shortness of breath  trouble with balance, talking, or walking  unusual vaginal bleeding, discharge  yellowing of the eyes or skin Side effects that usually do not require medical attention (report to your doctor or health care professional if they continue or are bothersome):  acne  breast pain  change in sex drive or performance  changes in weight  cramping, dizziness, or faintness while the device is being inserted  headache  irregular menstrual bleeding within first 3 to 6 months of use  nausea This list may not describe all possible side effects. Call your doctor for medical advice about side effects. You may report side effects to FDA at 1-800-FDA-1088. Where should I keep my medicine? This does not apply. NOTE: This sheet is a summary. It may not cover all possible information. If you have questions about this medicine, talk to your doctor, pharmacist, or health care provider.  2020 Elsevier/Gold Standard (2018-09-16 13:22:01)  

## 2019-07-28 NOTE — Progress Notes (Signed)
Subjective:     Patty Valdez is a 33 y.o. female who presents for a postpartum visit. She is 4 weeks postpartum following a spontaneous vaginal delivery. I have fully reviewed the prenatal and intrapartum course. The delivery was at 28 gestational weeks. Outcome: vaginal birth after cesarean (VBAC). Anesthesia: epidural. Postpartum course has been uncomplicated. Baby's course has been unremarkable. Baby is feeding by both breast and bottle - Gentle Ease. Bleeding spottingBowel function is normal. Bladder function is normal. Patient is sexually active. Contraception method is none. Postpartum depression screening: positive EPDS= 20. Patient admits to feeling overwhelmed with caring for her children while her husband is working. She denies suicidal ideations. Patient restarted her Lexapro 1 week ago. Patient states she has a therapist that she can reach out to at any time     Review of Systems Pertinent items are noted in HPI.   Objective:    BP (!) 167/107   Pulse 79   Wt 154 lb 14.4 oz (70.3 kg)   BMI 27.44 kg/m   General:  alert, cooperative and no distress   Breasts:  inspection negative, no nipple discharge or bleeding, no masses or nodularity palpable  Lungs: clear to auscultation bilaterally  Heart:  regular rate and rhythm  Abdomen: soft, non-tender; bowel sounds normal; no masses,  no organomegaly   Vulva:  normal  Vagina: normal vagina, no discharge, exudate, lesion, or erythema  Cervix:  multiparous appearance  Corpus: normal size, contour, position, consistency, mobility, non-tender  Adnexa:  normal adnexa and no mass, fullness, tenderness  Rectal Exam: Not performed.        Assessment:     Normal postpartum exam. Pap smear not done at today's visit.   Plan:    1. Contraception: IUD desired. Patient with recent unprotected intercourse. Will have the patient return in 2 weeks 2. Patient is medically cleared to resume all activities of daily living. Patient with elevated  BP today. Per chart review, patient likely CHTN. Will start lisnopril. Patient to follow up with PCP 3. Follow up in: 2 weeks for IUD insertion or as needed.

## 2019-08-04 ENCOUNTER — Other Ambulatory Visit: Payer: Self-pay

## 2019-08-04 ENCOUNTER — Telehealth: Payer: Medicaid Other | Admitting: Clinical

## 2019-08-04 ENCOUNTER — Encounter: Payer: Self-pay | Admitting: Nurse Practitioner

## 2019-08-04 DIAGNOSIS — Z91199 Patient's noncompliance with other medical treatment and regimen due to unspecified reason: Secondary | ICD-10-CM

## 2019-08-04 DIAGNOSIS — Z5329 Procedure and treatment not carried out because of patient's decision for other reasons: Secondary | ICD-10-CM

## 2019-08-04 NOTE — BH Specialist Note (Signed)
Pt did not arrive to video visit and did not answer the phone; Left HIPPA-compliant message to call back [name] from [facility] [employer] at [phone].    [place] via Telemedicine Video Visit  08/04/2019 Patty Valdez [id number]  [name]

## 2019-08-13 ENCOUNTER — Ambulatory Visit: Payer: Medicaid Other | Admitting: Internal Medicine

## 2019-09-02 ENCOUNTER — Other Ambulatory Visit: Payer: Self-pay | Admitting: Obstetrics and Gynecology

## 2019-09-08 ENCOUNTER — Ambulatory Visit: Payer: Medicaid Other | Admitting: Obstetrics and Gynecology

## 2019-12-24 ENCOUNTER — Ambulatory Visit: Payer: Medicaid Other | Admitting: Obstetrics & Gynecology

## 2021-05-16 ENCOUNTER — Encounter (HOSPITAL_COMMUNITY): Payer: Self-pay

## 2021-05-16 ENCOUNTER — Other Ambulatory Visit: Payer: Self-pay

## 2021-05-16 ENCOUNTER — Ambulatory Visit (HOSPITAL_COMMUNITY)
Admission: EM | Admit: 2021-05-16 | Discharge: 2021-05-16 | Disposition: A | Discharge status: Home / Self Care | Payer: Medicaid Other | Attending: Family Medicine | Admitting: Family Medicine

## 2021-05-16 DIAGNOSIS — K047 Periapical abscess without sinus: Secondary | ICD-10-CM | POA: Diagnosis not present

## 2021-05-16 DIAGNOSIS — K0889 Other specified disorders of teeth and supporting structures: Secondary | ICD-10-CM

## 2021-05-16 MED ORDER — CEFTRIAXONE SODIUM 1 G IJ SOLR
INTRAMUSCULAR | Status: AC
  Filled 2021-05-16: qty 10

## 2021-05-16 MED ORDER — CLINDAMYCIN HCL 300 MG PO CAPS: 300.0000 mg | ORAL_CAPSULE | Freq: Three times a day (TID) | ORAL | 0 refills | Status: AC

## 2021-05-16 MED ORDER — CEFTRIAXONE SODIUM 1 G IJ SOLR
1.0000 g | Freq: Once | INTRAMUSCULAR | Status: AC
  Administered 2021-05-16: 1 g via INTRAMUSCULAR

## 2021-05-16 MED ORDER — HYDROCODONE-ACETAMINOPHEN 5-325 MG PO TABS: 1.0000 | ORAL_TABLET | Freq: Four times a day (QID) | ORAL | 0 refills | Status: DC | PRN

## 2021-05-16 MED ORDER — KETOROLAC TROMETHAMINE 60 MG/2ML IM SOLN
INTRAMUSCULAR | Status: AC
  Filled 2021-05-16: qty 2

## 2021-05-16 MED ORDER — CLINDAMYCIN HCL 300 MG PO CAPS: 300.0000 mg | ORAL_CAPSULE | Freq: Three times a day (TID) | ORAL | 0 refills | Status: DC

## 2021-05-16 MED ORDER — KETOROLAC TROMETHAMINE 60 MG/2ML IM SOLN
60.0000 mg | Freq: Once | INTRAMUSCULAR | Status: AC
  Administered 2021-05-16: 60 mg via INTRAMUSCULAR

## 2021-05-16 MED ORDER — LIDOCAINE HCL (PF) 1 % IJ SOLN
INTRAMUSCULAR | Status: AC
  Filled 2021-05-16: qty 2

## 2021-05-16 MED ORDER — HYDROCODONE-ACETAMINOPHEN 5-325 MG PO TABS: 1.0000 | ORAL_TABLET | Freq: Four times a day (QID) | ORAL | 0 refills | Status: AC | PRN

## 2021-05-16 NOTE — Discharge Instructions (Addendum)

## 2021-05-16 NOTE — ED Triage Notes (Signed)
Pt presents with dental pain X 1 day.   She states her tongue is numb and states the left side of her face is swelling.   States she has not been able to eat or talk.

## 2021-05-16 NOTE — ED Provider Notes (Signed)
[facility]   [id number] 05/16/21 Arrival Time: 1106  ASSESSMENT & PLAN:  1. Pain, dental   2. Dental infection     No sign of abscess requiring I&D at this time. Discussed. Comfortable with home observation.  Meds ordered this encounter  Medications   cefTRIAXone (ROCEPHIN) injection 1 g   ketorolac (TORADOL) injection 60 mg   clindamycin (CLEOCIN) 300 MG capsule    Sig: Take 1 capsule (300 mg total) by mouth 3 (three) times daily.    Dispense:  30 capsule    Refill:  0   HYDROcodone-acetaminophen (NORCO/VICODIN) 5-325 MG tablet    Sig: Take 1 tablet by mouth every 6 (six) hours as needed for moderate pain or severe pain.    Dispense:  8 tablet    Refill:  0   [place] Controlled Substances Registry consulted for this patient. I feel the risk/benefit ratio today is favorable for proceeding with this prescription for a controlled substance. Medication sedation precautions given.   Follow-up Information     [facility] EMERGENCY DEPARTMENT.   Specialty: Emergency Medicine Why: If worsening or failing to improve as anticipated. Contact information: [address] [id number] mc [address] [place] [address] [phone]        Salli Real, MD.   Specialty: Internal Medicine Why: As needed. Contact information: [address] [place] [address] [phone]                 Dental resource written instructions given. She will schedule dental evaluation as soon as possible if not improving over the next 24-48 hours.  Reviewed expectations re: course of current medical issues. Questions answered. Outlined signs and symptoms indicating need for more acute intervention. Patient verbalized understanding. After Visit Summary given.   SUBJECTIVE:  Patty Valdez is a 35 y.o. female who reports abrupt onset of left lower dental pain described as aching/throbbing. Present for 1-2 days. Fever: absent. Tolerating PO intake  but reports pain with chewing. Normal swallowing. She does not see a dentist regularly. No neck swelling or pain. OTC analgesics without relief.  OBJECTIVE: Vitals:   05/16/21 1217  BP: 128/76  Pulse: 91  Resp: 17  Temp: 98.5 F (36.9 C)  TempSrc: Oral  SpO2: 100%    General appearance: alert; no distress HENT: normocephalic; atraumatic; dentition: fair; left lower gums without areas of fluctuance, drainage, or bleeding and with tenderness to palpation; with significant swelling though; normal jaw movement without difficulty Neck: supple without LAD; FROM; trachea midline Lungs: normal respirations; unlabored; speaks full sentences without difficulty Skin: warm and dry Psychological: alert and cooperative; normal mood and affect  No Known Allergies  Past Medical History:  Diagnosis Date   Abdominal pain    Anemia 10 yrs ago   Anxiety    Chronic cholecystitis with calculus 01/08/2012   Complication of anesthesia 04-08-2009   low blood pressure after epidural, had to take iv meds for   Gallstones    Genital herpes    no current meds for   Hepatitis C    Migraine    Nausea    Obesity    Vaginal bleeding    Social History   Socioeconomic History   Marital status: Married    Spouse name: Gabriellah Rabel   Number of children: 3   Years of education: Not on file   Highest education level: GED or equivalent  Occupational History   Occupation: stay at home mom  Tobacco Use   Smoking  status: Every Day    Packs/day: 0.50    Years: 10.00    Pack years: 5.00    Types: Cigarettes   Smokeless tobacco: Never   Tobacco comments:    6 cigarettes per day  Vaping Use   Vaping Use: Never used  Substance and Sexual Activity   Alcohol use: No    Comment: last drink was 1 yr ago   Drug use: Yes    Frequency: 2.0 times per week    Types: Marijuana, Barbituates   Sexual activity: Yes    Birth control/protection: I.U.D.  Other Topics Concern   Not on file  Social History  Narrative   Not on file   Social Determinants of Health   Financial Resource Strain: Not on file  Food Insecurity: Not on file  Transportation Needs: Not on file  Physical Activity: Not on file  Stress: Not on file  Social Connections: Not on file  Intimate Partner Violence: Not on file   Family History  Problem Relation Age of Onset   Cancer Maternal Grandfather        prostate   Hypertension Mother    Diabetes Mother    Neuropathy Mother    Past Surgical History:  Procedure Laterality Date   CESAREAN SECTION N/A 05/15/2015   Procedure: CESAREAN SECTION;  Surgeon: Kathreen Cosier, MD;  Location: [facility];  Service: Obstetrics;  Laterality: N/A;   CHOLECYSTECTOMY     I & D EXTREMITY Left 04/02/2017   Procedure: IRRIGATION AND DEBRIDEMENT EXTREMITY;  Surgeon: Knute Neu, MD;  Location: [facility] OR;  Service: Plastics;  Laterality: Left;      Mardella Layman, MD 05/17/21 [phone]

## 2023-03-15 ENCOUNTER — Other Ambulatory Visit: Payer: Self-pay

## 2023-03-15 ENCOUNTER — Emergency Department (HOSPITAL_COMMUNITY)
Admission: EM | Admit: 2023-03-15 | Discharge: 2023-03-15 | Disposition: A | Discharge status: Home / Self Care | Payer: Medicaid Other | Attending: Emergency Medicine | Admitting: Emergency Medicine

## 2023-03-15 ENCOUNTER — Encounter (HOSPITAL_COMMUNITY): Payer: Self-pay

## 2023-03-15 DIAGNOSIS — N611 Abscess of the breast and nipple: Secondary | ICD-10-CM | POA: Diagnosis not present

## 2023-03-15 MED ORDER — SULFAMETHOXAZOLE-TRIMETHOPRIM 800-160 MG PO TABS: 1.0000 | ORAL_TABLET | Freq: Two times a day (BID) | ORAL | 0 refills | Status: AC

## 2023-03-15 MED ORDER — SULFAMETHOXAZOLE-TRIMETHOPRIM 800-160 MG PO TABS
1.0000 | ORAL_TABLET | Freq: Once | ORAL | Status: AC
  Administered 2023-03-15: 1 via ORAL
  Filled 2023-03-15: qty 1

## 2023-03-15 NOTE — ED Triage Notes (Signed)
C/o cyst to left breast with pus drainage, heat, redness, and swelling x2 days.

## 2023-03-15 NOTE — Discharge Instructions (Addendum)
Call the [facility] the following morning at [address] [phone]). We have an agreement in place with [facility] to provide the required orders for performing abscess drainage and to aid in the outpatient management of these patients.  In the meantime take Bactrim twice daily for the next week.  Do warm compresses at home.  If the redness spreads outside of the circle we drew, you have fevers, chills, worsening symptoms return to the ED for further evaluation.

## 2023-03-15 NOTE — ED Provider Triage Note (Signed)
Emergency Medicine Provider Triage Evaluation Note  Patty Valdez , a 37 y.o. female  was evaluated in triage.  Pt complains of left breast abscess.  States she has had it for the past 2 days however this morning has been having purulent drainage.  Patient denies history of abscesses and denies any nipple discharge.  Patient states she is not breast-feeding and does not have a recent pregnancy.  Patient denies fevers/chills, shortness shortness of breath, chest pain, nausea/vomiting.  He last had Tylenol an hour ago.  Review of Systems  Positive: See HPI Negative: See HPI  Physical Exam  There were no vitals taken for this visit. Gen:   Awake, no distress   Resp:  Normal effort  MSK:   Moves extremities without difficulty  Other:    Medical Decision Making  Medically screening exam initiated at 4:36 PM.  Appropriate orders placed.  Patty Valdez was informed that the remainder of the evaluation will be completed by another provider, this initial triage assessment does not replace that evaluation, and the importance of remaining in the ED until their evaluation is complete.  Workup initiated, patient stable at this time   Remi Deter 03/15/23 1640

## 2023-03-15 NOTE — ED Provider Notes (Signed)
[place] EMERGENCY DEPARTMENT AT [facility] Provider Note   CSN: [id number] Arrival date & time: 03/15/23  1624     History  Chief Complaint  Patient presents with   Cyst    Patty Valdez is a 37 y.o. female.  HPI   Patient presents due to abscess to left breast.  Started 2 days ago initially as a small pimple, since then it has been increasing in size and draining.  She is having surrounding erythema and pain.  No fevers at home but is concerned about worsening infection.  Patient is not breast-feeding, no history of previous abscesses or infection.  Not immunocompromise anyway.  No history of breast cancer.  Home Medications Prior to Admission medications   Medication Sig Start Date End Date Taking? Authorizing Provider  sulfamethoxazole-trimethoprim (BACTRIM DS) 800-160 MG tablet Take 1 tablet by mouth 2 (two) times daily for 7 days. 03/15/23 03/22/23 Yes Theron Arista, PA-C  clindamycin (CLEOCIN) 300 MG capsule Take 1 capsule (300 mg total) by mouth 3 (three) times daily. 05/16/21   Mardella Layman, MD  clonazePAM (KLONOPIN) 1 MG tablet Take 1 mg by mouth 3 (three) times daily. 07/22/19   [provider]  escitalopram (LEXAPRO) 20 MG tablet Take 20 mg by mouth daily. 07/07/19   [provider]  HYDROcodone-acetaminophen (NORCO/VICODIN) 5-325 MG tablet Take 1 tablet by mouth every 6 (six) hours as needed for moderate pain or severe pain. 05/16/21   Mardella Layman, MD  ibuprofen (ADVIL) 600 MG tablet Take 1 tablet (600 mg total) by mouth every 6 (six) hours. Patient not taking: Reported on 07/28/2019 06/24/19   Aviva Signs, CNM  lisinopril (ZESTRIL) 20 MG tablet Take 1 tablet (20 mg total) by mouth daily. 07/28/19   Constant, Peggy, MD  Prenat w/o A-[id number]-[id number]-DHA (CITRANATAL ASSURE) 35-1 & 300 MG tablet Take 1 tablet by mouth daily. Patient not taking: Reported on 07/28/2019 06/15/19   Nugent, Odie Sera, NP  SUBOXONE 8-2 MG FILM Take 1 each by mouth 3 (three)  times daily. 07/17/17 04/21/19  [provider]      Allergies    Patient has no known allergies.    Review of Systems   Review of Systems  Physical Exam Updated Vital Signs BP 123/79 (BP Location: Right Arm)   Pulse 100   Temp 98.7 F (37.1 C) (Oral)   Resp 18   Wt 70 kg   SpO2 100%   BMI 27.34 kg/m  Physical Exam Vitals and nursing note reviewed. Exam conducted with a chaperone present.  Constitutional:      Appearance: Normal appearance.  HENT:     Head: Normocephalic and atraumatic.  Eyes:     General: No scleral icterus.       Right eye: No discharge.        Left eye: No discharge.     Extraocular Movements: Extraocular movements intact.     Pupils: Pupils are equal, round, and reactive to light.  Cardiovascular:     Rate and Rhythm: Regular rhythm. Tachycardia present.     Pulses: Normal pulses.     Heart sounds: Normal heart sounds.     No friction rub. No gallop.  Pulmonary:     Effort: Pulmonary effort is normal. No respiratory distress.     Breath sounds: Normal breath sounds.  Abdominal:     General: Abdomen is flat. Bowel sounds are normal. There is no distension.     Palpations: Abdomen is soft.  Tenderness: There is no abdominal tenderness.  Skin:    General: Skin is warm and dry.     Coloration: Skin is not jaundiced.     Findings: Erythema and rash present.     Comments: Left breast with purulent head with sick centimeter surrounding erythema roughly circumferentially in a circular pattern.  Purulent center is superior to the nipple.  Neurological:     Mental Status: She is alert. Mental status is at baseline.     Coordination: Coordination normal.     ED Results / Procedures / Treatments   Labs (all labs ordered are listed, but only abnormal results are displayed) Labs Reviewed - No data to display  EKG None  Radiology No results found.  Procedures Procedures    Medications Ordered in ED Medications   sulfamethoxazole-trimethoprim (BACTRIM DS) 800-160 MG per tablet 1 tablet (1 tablet Oral Given 03/15/23 1838)    ED Course/ Medical Decision Making/ A&P                             Medical Decision Making Risk Prescription drug management.   Patient presents to the emergency department due to breast abscess.  Differential includes breast abscess, breast metastasis, cellulitis, sepsis.  Patient is afebrile but mildly tachycardic on exam.  She does have significant cellulitis and a purulent area on the breast not involving the nipple.  There is no nipple involvement or discharge, she is not breast-feeding so low suspicion for mastitis.  Clinically, no SIRS criteria and I do not think she is septic.    Needle aspiration performed with 5 cc of purulent material drained.  Patient referred to PCP for breast cysts imaging, will start on Bactrim first dose given here in the ED.  Barrier drawn outlining the cellulitis on her breast, return precautions were discussed with the patient who verbalized understanding.  Stable for discharge at this time.  Discussed HPI, physical exam and plan of care for this patient with attending Faith Rogue. The attending physician evaluated this patient as part of a shared visit and agrees with plan of care.         Final Clinical Impression(s) / ED Diagnoses Final diagnoses:  Breast abscess    Rx / DC Orders ED Discharge Orders          Ordered    sulfamethoxazole-trimethoprim (BACTRIM DS) 800-160 MG tablet  2 times daily        03/15/23 1839              Theron Arista, Cordelia Poche 03/15/23 2141    Rolan Bucco, MD 03/15/23 2159

## 2023-03-19 ENCOUNTER — Other Ambulatory Visit: Payer: Self-pay | Admitting: Pain Medicine

## 2023-03-19 DIAGNOSIS — N644 Mastodynia: Secondary | ICD-10-CM

## 2023-04-02 ENCOUNTER — Inpatient Hospital Stay: Admission: RE | Admit: 2023-04-02 | Payer: Medicaid Other | Source: Ambulatory Visit
